# Patient Record
Sex: Male | Born: 1965
Health system: Southern US, Community
[De-identification: ages and names within clinical notes are randomized; demographics above are authoritative.]

## PROBLEM LIST (undated history)

## (undated) DIAGNOSIS — J449 Chronic obstructive pulmonary disease, unspecified: Secondary | ICD-10-CM

## (undated) DIAGNOSIS — J101 Influenza due to other identified influenza virus with other respiratory manifestations: Secondary | ICD-10-CM

## (undated) DIAGNOSIS — E785 Hyperlipidemia, unspecified: Secondary | ICD-10-CM

## (undated) DIAGNOSIS — D369 Benign neoplasm, unspecified site: Secondary | ICD-10-CM

## (undated) DIAGNOSIS — I2510 Atherosclerotic heart disease of native coronary artery without angina pectoris: Secondary | ICD-10-CM

## (undated) DIAGNOSIS — E119 Type 2 diabetes mellitus without complications: Secondary | ICD-10-CM

## (undated) DIAGNOSIS — E669 Obesity, unspecified: Secondary | ICD-10-CM

## (undated) DIAGNOSIS — K5790 Diverticulosis of intestine, part unspecified, without perforation or abscess without bleeding: Secondary | ICD-10-CM

## (undated) HISTORY — DX: Hyperlipidemia, unspecified: E78.5

## (undated) HISTORY — DX: Benign neoplasm, unspecified site: D36.9

## (undated) HISTORY — DX: Diverticulosis of intestine, part unspecified, without perforation or abscess without bleeding: K57.90

## (undated) HISTORY — DX: Chronic obstructive pulmonary disease, unspecified: J44.9

## (undated) HISTORY — DX: Obesity, unspecified: E66.9

## (undated) HISTORY — PX: ANTERIOR CRUCIATE LIGAMENT REPAIR: SHX115

## (undated) HISTORY — DX: Atherosclerotic heart disease of native coronary artery without angina pectoris: I25.10

## (undated) HISTORY — DX: Type 2 diabetes mellitus without complications: E11.9

---

## 2003-12-19 ENCOUNTER — Emergency Department (HOSPITAL_COMMUNITY): Admission: EM | Admit: 2003-12-19 | Discharge: 2003-12-19 | Payer: Self-pay | Admitting: Emergency Medicine

## 2006-05-16 ENCOUNTER — Ambulatory Visit (HOSPITAL_COMMUNITY): Admission: RE | Admit: 2006-05-16 | Discharge: 2006-05-16 | Payer: Self-pay | Admitting: Family Medicine

## 2009-07-22 HISTORY — PX: CORONARY STENT PLACEMENT: SHX1402

## 2009-10-20 ENCOUNTER — Encounter: Admission: RE | Admit: 2009-10-20 | Discharge: 2009-10-20 | Payer: Self-pay | Admitting: Sports Medicine

## 2010-06-15 ENCOUNTER — Inpatient Hospital Stay (HOSPITAL_COMMUNITY)
Admission: EM | Admit: 2010-06-15 | Discharge: 2010-06-19 | Payer: Self-pay | Source: Home / Self Care | Admitting: Emergency Medicine

## 2010-06-15 ENCOUNTER — Ambulatory Visit: Payer: Self-pay | Admitting: Cardiology

## 2010-06-19 ENCOUNTER — Encounter: Payer: Self-pay | Admitting: Cardiology

## 2010-07-18 DIAGNOSIS — J449 Chronic obstructive pulmonary disease, unspecified: Secondary | ICD-10-CM | POA: Insufficient documentation

## 2010-07-18 DIAGNOSIS — E785 Hyperlipidemia, unspecified: Secondary | ICD-10-CM | POA: Insufficient documentation

## 2010-07-18 DIAGNOSIS — E669 Obesity, unspecified: Secondary | ICD-10-CM | POA: Insufficient documentation

## 2010-07-18 DIAGNOSIS — Z9861 Coronary angioplasty status: Secondary | ICD-10-CM

## 2010-07-18 DIAGNOSIS — E1169 Type 2 diabetes mellitus with other specified complication: Secondary | ICD-10-CM | POA: Insufficient documentation

## 2010-07-18 DIAGNOSIS — I2510 Atherosclerotic heart disease of native coronary artery without angina pectoris: Secondary | ICD-10-CM | POA: Insufficient documentation

## 2010-07-19 ENCOUNTER — Ambulatory Visit: Payer: Self-pay | Admitting: Cardiology

## 2010-08-23 NOTE — Assessment & Plan Note (Signed)
Summary: eph/jml   History of Present Illness: 45 year old male with coronary artery disease for followup. Admitted in November of 2011 and ruled in for a subendocardial myocardial infarctions. Cardiac catheterization performed on June 19, 2010 revealed normal LM; 30% proximal LAD; normal Lcx; 90% RCA; Normal EF; patient had 2 drug-eluting stents to the right coronary artery at that time. Since then, the patient has dyspnea with more extreme activities but not with routine activities. It is relieved with rest. It is not associated with chest pain. There is no orthopnea, PND or pedal edema. There is no syncope or palpitations. There is no exertional chest pain.   Current Medications (verified): 1)  Plavix 75 Mg Tabs (Clopidogrel Bisulfate) .... Take One Tablet By Mouth Daily 2)  Lipitor 80 Mg Tabs (Atorvastatin Calcium) .... Take One Tablet By Mouth Daily. 3)  Metoprolol Tartrate 25 Mg Tabs (Metoprolol Tartrate) .... Take One Tablet By Mouth Twice A Day 4)  Aspirin 81 Mg Tbec (Aspirin) .... Take One Tablet By Mouth Daily 5)  Nitrostat 0.4 Mg Subl (Nitroglycerin) .Marland Kitchen.. 1 Tablet Under Tongue At Onset of Chest Pain; You May Repeat Every 5 Minutes For Up To 3 Doses.  Past History:  Past Medical History: Reviewed history from 07/18/2010 and no changes required. HYPERLIPIDEMIA CAD OBESITY COPD  Past Surgical History: Reviewed history from 07/18/2010 and no changes required.  Status post anterior cruciate ligament repair.   Social History: Reviewed history from 07/18/2010 and no changes required.  He has been married twice.  He has been married to his   current wife for about 3 years.  He smoked 1 pack of cigarettes per day   for 30 years; now discontinued.  He works at [employer] in [place].  He says   that he has some alcohol about once a week.  He has no children.      Review of Systems       Describes lower extremity myalgias but no fevers or chills, productive cough,  hemoptysis, dysphasia, odynophagia, melena, hematochezia, dysuria, hematuria, rash, seizure activity, orthopnea, PND, pedal edema, claudication. Remaining systems are negative.   Vital Signs:  Patient profile:   45 year old male Height:      75 inches Weight:      266 pounds BMI:     33.37 Pulse rate:   59 / minute Resp:     14 per minute BP sitting:   105 / 72  (left arm)  Vitals Entered By: Kem Parkinson (July 19, 2010 8:45 AM)  Physical Exam  General:  Well-developed well-nourished in no acute distress.  Skin is warm and dry.  HEENT is normal.  Neck is supple. No thyromegaly.  Chest is clear to auscultation with normal expansion.  Cardiovascular exam is regular rate and rhythm.  Abdominal exam nontender or distended. No masses palpated. Right groin shows no hematoma and no bruit. Extremities show no edema. neuro grossly intact    Impression & Recommendations:  Problem # 1:  HYPERLIPIDEMIA (ICD-272.4)  Patient has developed myalgias on Lipitor. Discontinue that medication. Check total CK. Begin Crestor 20 mg p.o. daily to see if he tolerates. Check lipids and liver and 6 weeks. His updated medication list for this problem includes:    Lipitor 80 Mg Tabs (Atorvastatin calcium) .Marland Kitchen... Take one tablet by mouth daily.  Orders: TLB-CK Total Only(Creatine Kinase/CPK) (82550-CK)  Problem # 2:  CAD (ICD-414.00) Continue aspirin, Plavix, beta blocker and statin. Discussed risk factor modification including diet and exercise.  He has discontinued his tobacco use. His updated medication list for this problem includes:    Plavix 75 Mg Tabs (Clopidogrel bisulfate) .Marland Kitchen... Take one tablet by mouth daily    Metoprolol Tartrate 25 Mg Tabs (Metoprolol tartrate) .Marland Kitchen... Take one tablet by mouth twice a day    Aspirin 81 Mg Tbec (Aspirin) .Marland Kitchen... Take one tablet by mouth daily    Nitrostat 0.4 Mg Subl (Nitroglycerin) .Marland Kitchen... 1 tablet under tongue at onset of chest pain; you may repeat every  5 minutes for up to 3 doses.  His updated medication list for this problem includes:    Plavix 75 Mg Tabs (Clopidogrel bisulfate) .Marland Kitchen... Take one tablet by mouth daily    Metoprolol Tartrate 25 Mg Tabs (Metoprolol tartrate) .Marland Kitchen... Take one tablet by mouth twice a day    Aspirin 81 Mg Tbec (Aspirin) .Marland Kitchen... Take one tablet by mouth daily    Nitrostat 0.4 Mg Subl (Nitroglycerin) .Marland Kitchen... 1 tablet under tongue at onset of chest pain; you may repeat every 5 minutes for up to 3 doses.  Problem # 3:  COPD (ICD-496) Management per primary care.  Patient Instructions: 1)  Your physician recommends that you return for lab work in:6 WEEKS IF ABLE TO TOLERATE CRESTOR 2)  Your physician has recommended you make the following change in your medication: STOP LIPITOR 3)  START CRESTOR 20MG  ONCE DAILY 4)  Your physician wants you to follow-up in: 6 MONTHS  You will receive a reminder letter in the mail two months in advance. If you don't receive a letter, please call our office to schedule the follow-up appointment. Prescriptions: CRESTOR 20 MG TABS (ROSUVASTATIN CALCIUM) Take one tablet by mouth daily.  #30 x 12   Entered by:   Deliah Goody, RN   Authorized by:   Ferman Hamming, MD, [facility]   Signed by:   Deliah Goody, RN on 07/19/2010   Method used:   Print then Give to Patient   RxID:   [id number]

## 2010-10-02 LAB — HEPARIN LEVEL (UNFRACTIONATED)
Heparin Unfractionated: 0.16 IU/mL — ABNORMAL LOW (ref 0.30–0.70)
Heparin Unfractionated: 0.16 IU/mL — ABNORMAL LOW (ref 0.30–0.70)
Heparin Unfractionated: 0.36 IU/mL (ref 0.30–0.70)
Heparin Unfractionated: 0.41 IU/mL (ref 0.30–0.70)
Heparin Unfractionated: <0.1 IU/mL — ABNORMAL LOW (ref 0.30–0.70)
Heparin Unfractionated: <0.1 IU/mL — ABNORMAL LOW (ref 0.30–0.70)

## 2010-10-02 LAB — CK TOTAL AND CKMB (NOT AT ARMC)
CK, MB: 11.2 ng/mL (ref 0.3–4.0)
Relative Index: 5.9 — ABNORMAL HIGH (ref 0.0–2.5)
Total CK: 168 U/L (ref 7–232)

## 2010-10-02 LAB — TROPONIN I
Troponin I: 0.06 ng/mL (ref 0.00–0.06)
Troponin I: 0.53 ng/mL (ref 0.00–0.06)

## 2010-10-02 LAB — CBC
HCT: 44.9 % (ref 39.0–52.0)
HCT: 45.1 % (ref 39.0–52.0)
HCT: 46.1 % (ref 39.0–52.0)
Hemoglobin: 14.3 g/dL (ref 13.0–17.0)
Hemoglobin: 14.3 g/dL (ref 13.0–17.0)
Hemoglobin: 14.4 g/dL (ref 13.0–17.0)
Hemoglobin: 15.2 g/dL (ref 13.0–17.0)
MCH: 31 pg (ref 26.0–34.0)
MCH: 31.3 pg (ref 26.0–34.0)
MCH: 31.7 pg (ref 26.0–34.0)
MCH: 32.1 pg (ref 26.0–34.0)
MCHC: 31.8 g/dL (ref 30.0–36.0)
MCHC: 32.1 g/dL (ref 30.0–36.0)
MCHC: 33 g/dL (ref 30.0–36.0)
MCV: 96.2 fL (ref 78.0–100.0)
MCV: 97.2 fL (ref 78.0–100.0)
MCV: 97.6 fL (ref 78.0–100.0)
MCV: 97.6 fL (ref 78.0–100.0)
MCV: 97.8 fL (ref 78.0–100.0)
Platelets: 262 10*3/uL (ref 150–400)
Platelets: 270 10*3/uL (ref 150–400)
Platelets: 292 10*3/uL (ref 150–400)
RBC: 4.57 MIL/uL (ref 4.22–5.81)
RBC: 4.62 MIL/uL (ref 4.22–5.81)
RBC: 4.79 MIL/uL (ref 4.22–5.81)
RDW: 14 % (ref 11.5–15.5)
RDW: 14.2 % (ref 11.5–15.5)
RDW: 14.4 % (ref 11.5–15.5)
RDW: 14.6 % (ref 11.5–15.5)
WBC: 11 10*3/uL — ABNORMAL HIGH (ref 4.0–10.5)
WBC: 11.3 10*3/uL — ABNORMAL HIGH (ref 4.0–10.5)
WBC: 11.7 10*3/uL — ABNORMAL HIGH (ref 4.0–10.5)
WBC: 17.7 10*3/uL — ABNORMAL HIGH (ref 4.0–10.5)

## 2010-10-02 LAB — BASIC METABOLIC PANEL
BUN: 7 mg/dL (ref 6–23)
Calcium: 9.1 mg/dL (ref 8.4–10.5)
Creatinine, Ser: 0.72 mg/dL (ref 0.4–1.5)
GFR calc non Af Amer: >60 mL/min (ref >60)
Glucose, Bld: 103 mg/dL — ABNORMAL HIGH (ref 70–99)
Potassium: 4.2 mEq/L (ref 3.5–5.1)

## 2010-10-02 LAB — HEPATIC FUNCTION PANEL
ALT: 47 U/L (ref 0–53)
AST: 27 U/L (ref 0–37)
Bilirubin, Direct: 0.2 mg/dL (ref 0.0–0.3)
Total Bilirubin: 0.5 mg/dL (ref 0.3–1.2)

## 2010-10-02 LAB — PLATELET INHIBITION P2Y12: Platelet Function  P2Y12: 89 [PRU] — ABNORMAL LOW (ref 194–418)

## 2010-10-02 LAB — COMPREHENSIVE METABOLIC PANEL
BUN: 7 mg/dL (ref 6–23)
CO2: 24 mEq/L (ref 19–32)
Calcium: 9 mg/dL (ref 8.4–10.5)
Chloride: 106 mEq/L (ref 96–112)
Creatinine, Ser: 0.77 mg/dL (ref 0.4–1.5)
GFR calc Af Amer: >60 mL/min (ref >60)
GFR calc non Af Amer: >60 mL/min (ref >60)
Total Bilirubin: 0.5 mg/dL (ref 0.3–1.2)

## 2010-10-02 LAB — LIPID PANEL
LDL Cholesterol: 151 mg/dL — ABNORMAL HIGH (ref 0–99)
Triglycerides: 107 mg/dL (ref <150)

## 2010-10-02 LAB — TSH: TSH: 4.216 u[IU]/mL (ref 0.350–4.500)

## 2010-10-02 LAB — PROTIME-INR
INR: 0.93 (ref 0.00–1.49)
Prothrombin Time: 11.9 seconds (ref 11.6–15.2)
Prothrombin Time: 12.7 seconds (ref 11.6–15.2)

## 2010-10-02 LAB — DIFFERENTIAL
Basophils Absolute: 0.1 10*3/uL (ref 0.0–0.1)
Basophils Relative: 0 % (ref 0–1)
Eosinophils Absolute: 0.2 10*3/uL (ref 0.0–0.7)
Eosinophils Relative: 1 % (ref 0–5)
Lymphocytes Relative: 11 % — ABNORMAL LOW (ref 12–46)

## 2010-10-02 LAB — POCT I-STAT, CHEM 8
Calcium, Ion: 1.2 mmol/L (ref 1.12–1.32)
Glucose, Bld: 71 mg/dL (ref 70–99)
HCT: 52 % (ref 39.0–52.0)
Hemoglobin: 17.7 g/dL — ABNORMAL HIGH (ref 13.0–17.0)
TCO2: 28 mmol/L (ref 0–100)

## 2010-10-02 LAB — APTT: aPTT: 41 seconds — ABNORMAL HIGH (ref 24–37)

## 2010-12-25 ENCOUNTER — Encounter: Payer: Self-pay | Admitting: Cardiology

## 2011-01-11 ENCOUNTER — Other Ambulatory Visit: Payer: Self-pay | Admitting: Cardiovascular Disease

## 2011-01-11 ENCOUNTER — Inpatient Hospital Stay (HOSPITAL_COMMUNITY)
Admission: EM | Admit: 2011-01-11 | Discharge: 2011-01-12 | DRG: 395 | Disposition: A | Discharge status: Home / Self Care | Payer: PRIVATE HEALTH INSURANCE | Attending: Internal Medicine | Admitting: Internal Medicine

## 2011-01-11 DIAGNOSIS — J449 Chronic obstructive pulmonary disease, unspecified: Secondary | ICD-10-CM | POA: Diagnosis present

## 2011-01-11 DIAGNOSIS — G5710 Meralgia paresthetica, unspecified lower limb: Secondary | ICD-10-CM | POA: Diagnosis present

## 2011-01-11 DIAGNOSIS — K621 Rectal polyp: Principal | ICD-10-CM | POA: Diagnosis present

## 2011-01-11 DIAGNOSIS — D126 Benign neoplasm of colon, unspecified: Secondary | ICD-10-CM | POA: Diagnosis present

## 2011-01-11 DIAGNOSIS — J4489 Other specified chronic obstructive pulmonary disease: Secondary | ICD-10-CM | POA: Diagnosis present

## 2011-01-11 DIAGNOSIS — E669 Obesity, unspecified: Secondary | ICD-10-CM | POA: Diagnosis present

## 2011-01-11 DIAGNOSIS — F17200 Nicotine dependence, unspecified, uncomplicated: Secondary | ICD-10-CM | POA: Diagnosis present

## 2011-01-11 DIAGNOSIS — K620 Anal polyp: Principal | ICD-10-CM | POA: Diagnosis present

## 2011-01-11 DIAGNOSIS — K921 Melena: Secondary | ICD-10-CM

## 2011-01-11 DIAGNOSIS — E785 Hyperlipidemia, unspecified: Secondary | ICD-10-CM | POA: Diagnosis present

## 2011-01-11 DIAGNOSIS — I2510 Atherosclerotic heart disease of native coronary artery without angina pectoris: Secondary | ICD-10-CM | POA: Diagnosis present

## 2011-01-11 DIAGNOSIS — J069 Acute upper respiratory infection, unspecified: Secondary | ICD-10-CM | POA: Diagnosis present

## 2011-01-11 DIAGNOSIS — Z9861 Coronary angioplasty status: Secondary | ICD-10-CM

## 2011-01-11 LAB — URINALYSIS, ROUTINE W REFLEX MICROSCOPIC
Ketones, ur: NEGATIVE mg/dL
Leukocytes, UA: NEGATIVE
Nitrite: NEGATIVE
Protein, ur: NEGATIVE mg/dL
Urobilinogen, UA: 0.2 mg/dL (ref 0.0–1.0)

## 2011-01-11 LAB — CBC
HCT: 45.1 % (ref 39.0–52.0)
Hemoglobin: 14.4 g/dL (ref 13.0–17.0)
MCH: 31.4 pg (ref 26.0–34.0)
MCHC: 32.8 g/dL (ref 30.0–36.0)
MCV: 94.9 fL (ref 78.0–100.0)
MCV: 96.1 fL (ref 78.0–100.0)
Platelets: 205 10*3/uL (ref 150–400)
Platelets: 257 10*3/uL (ref 150–400)
RBC: 4.75 MIL/uL (ref 4.22–5.81)
RBC: 4.6 MIL/uL (ref 4.22–5.81)
RDW: 14.5 % (ref 11.5–15.5)
WBC: 7.9 10*3/uL (ref 4.0–10.5)
WBC: 8.2 10*3/uL (ref 4.0–10.5)
WBC: 9.6 10*3/uL (ref 4.0–10.5)

## 2011-01-11 LAB — CARDIAC PANEL(CRET KIN+CKTOT+MB+TROPI)
Relative Index: 2.7 — ABNORMAL HIGH (ref 0.0–2.5)
Total CK: 146 U/L (ref 7–232)
Troponin I: <0.3 ng/mL (ref <0.30)

## 2011-01-11 LAB — COMPREHENSIVE METABOLIC PANEL
BUN: 10 mg/dL (ref 6–23)
CO2: 24 mEq/L (ref 19–32)
Chloride: 108 mEq/L (ref 96–112)
Creatinine, Ser: 0.75 mg/dL (ref 0.50–1.35)
GFR calc Af Amer: >60 mL/min (ref >60)
GFR calc non Af Amer: >60 mL/min (ref >60)
Total Bilirubin: 0.3 mg/dL (ref 0.3–1.2)

## 2011-01-11 LAB — DIFFERENTIAL
Basophils Absolute: 0.1 10*3/uL (ref 0.0–0.1)
Basophils Relative: 1 % (ref 0–1)
Eosinophils Absolute: 0.3 10*3/uL (ref 0.0–0.7)
Eosinophils Absolute: 0.3 10*3/uL (ref 0.0–0.7)
Eosinophils Relative: 3 % (ref 0–5)
Eosinophils Relative: 3 % (ref 0–5)
Eosinophils Relative: 4 % (ref 0–5)
Lymphocytes Relative: 25 % (ref 12–46)
Lymphs Abs: 2 10*3/uL (ref 0.7–4.0)
Lymphs Abs: 2.6 10*3/uL (ref 0.7–4.0)
Monocytes Absolute: 0.9 10*3/uL (ref 0.1–1.0)
Monocytes Relative: 12 % (ref 3–12)

## 2011-01-11 LAB — APTT: aPTT: 41 seconds — ABNORMAL HIGH (ref 24–37)

## 2011-01-11 LAB — LIPASE, BLOOD: Lipase: 43 U/L (ref 11–59)

## 2011-01-11 LAB — MRSA PCR SCREENING: MRSA by PCR: NEGATIVE

## 2011-01-11 NOTE — H&P (Signed)
William Carson, William Carson               ACCOUNT NO.:  [id number]  MEDICAL RECORD NO.:  [id number]  LOCATION:  IC12                          FACILITY:  [facility]  PHYSICIAN:  Isidor Holts, M.D.  DATE OF BIRTH:  [birth year]  DATE OF ADMISSION:  01/11/2011 DATE OF DISCHARGE:  LH                             HISTORY & PHYSICAL   PRIMARY CARE PROVIDER:  None.  PRIMARY CARDIOLOGIST:  Madolyn Frieze. Jens Som, MD, [facility] [place] cardiologist.  CHIEF COMPLAINT:  Bleeding per rectum on January 11, 2011.  HISTORY OF PRESENT ILLNESS:  This is a 45 year old male, he is a good historian and history is also supplemented by the patient's spouse, who was present in the emergency department at the time of this evaluation. Apparently, the patient felt quite well until he went to bed on January 10, 2011.  This a.m., however, he woke up, went to bathroom to move his bowels at about 6:30 a.m. and passed a large amount of bright red blood per rectum, with clots.  He denies abdominal pain, although he did experience mild transient left upper quadrant discomfort.  He has passed blood per rectum 2 more times, the last time in the emergency department.  The patient has no history of previous similar episodes. Denies any fatigue, shortness of breath, vomiting, or overt abdominal pain.  PAST MEDICAL HISTORY: 1. Status post left knee anterior cruciate ligament repair remotely. 2. COPD. 3. Coronary artery disease status post STEMI in November 2011, status     post cardiac catheterization with deployment of Promus drug-eluting     stent to RCA.  At that time, ejection fraction was 60%. 4. Dyslipidemia. 5. Obesity. 6. Smoker.  ALLERGIES:  No known drug allergies.  MEDICATION HISTORY: 1. Crestor dose unknown, 1 p.o. daily. 2. Plavix 75 mg p.o. daily. 3. Metoprolol tartrate 25 mg p.o. b.i.d. 4. Aspirin 325 mg p.o. daily. 5. Nitroglycerin 0.4 mg sublingually p.r.n. for chest pain.  REVIEW OF SYSTEMS:  As per HPI and chief  complaint.  The patient denies chest pain or shortness of breath.  Denies fever or chills.  Had a "flu- like" illness approximately 3-4 weeks ago and still has a mild residual cough, productive of yellowish-greenish phlegm.  He treated this with over-the-counter medications.  In addition, the patient complains of bilateral thigh pains exacerbated by movement, which has occurred fairly recently.  Rest of systems review is negative.  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  He has no offspring.  Drinks alcohol only occasionally.  Used to smoke a packet of cigarettes per day for about 30 years, but has now cut down to half a packet of cigarettes per day.   Denies drug abuse.  He is married.  FAMILY HISTORY:  The patient's father is age 66 years, he has atrial fibrillation.  His mother is age 72 years, she has a history of previous MI, status post stent.  PHYSICAL EXAMINATION:  VITAL SIGNS:  Temperature 97.5; pulse 58 per minute, regular; respiratory rate 20, BP 119/64 mmHg, pulse oximeter 96% on room air. GENERAL:  The patient did not appear to be in  obvious acute distress at the time of this evaluation, alert, communicative, not short of breath at rest. HEENT:  No clinical pallor.  No jaundice.  No conjunctival injection. Hydration status appears fair. NECK:  Supple with JVP not seen.  No palpable lymphadenopathy.  No palpable goiter. CHEST:  Clinically clear to auscultation.  No wheezes, no crackles. HEART:  Heart sounds 1 and 2 heard, normal, regular.  No murmurs. Mildly bradycardic. ABDOMEN:  Moderately obese, soft, nontender, unable to palpate organs. Bowel sounds are heard. EXTREMITIES:  Lower extremity examination, no pitting edema.  Palpable peripheral pulses. MUSCULOSKELETAL SYSTEM:  Quite unremarkable.  The patient has no tenderness to palpation of both thighs.  He does also not have  bilateral straight leg raising signs. CENTRAL NERVOUS SYSTEM:  No focal neurologic deficit on gross examination.  INVESTIGATIONS:  CBC; WBC 7.9, hemoglobin 14.9, hematocrit 45.1, platelets 230.  INR 0.99, APTT 41 seconds.  Electrolytes; sodium 142, potassium 4.2, chloride 108, CO2 of 24, BUN 10, creatinine 0.75, glucose 113.  LFTs are normal, lipase is 43.  Urinalysis is negative.  ASSESSMENT AND PLAN: 1. Lower gastrointestinal bleed. Etiology is unclear at the moment.     However, I suspect diverticular bleed, although other etiologies     do indeed have to be considered.  We shall admit the patient to     step-down unit as he still has ongoing bleed, put him on clears     p.o., commence intravenous fluid hydration, hold NSAIDS, do serial     CBCs, type and screen 2 units PRBC.  The ED MD has already     consulted Dr. Jonette Eva, gastroenterologist.  The patient will     certainly need colonoscopy.  2. Smoking history.  The patient has been counseled appropriately.  We     shall manage with NicoDerm CQ patch.  3. Upper respiratory tract infection.  We shall place the patient on 5     days of oral azithromycin.  4. Bilateral thigh pains.  According to the clinical picture, this appears     consistent with meralgia paresthetica.  I have recommended weight     loss and outpatient nerve conduction studies, which can be arranged     by the patient's MD.  However, for completeness, we will check CK     levels as the patient is on statin treatment at the present time.  5. History of coronary artery disease.  This appears asymptomatic.  As     discussed above, Plavix and aspirin will be placed on hold, until     Gastroenterology evaluation is completed.  Timing of reinstatement     will depend on Gastroenterology recommendation.  6. Chronic obstructive pulmonary disease.  This appears stable.   Further management will depend on clinical course.     Isidor Holts,  M.D.     CO/MEDQ  D:  01/11/2011  T:  01/11/2011  Job:  [id number]  cc:   Madolyn Frieze. Jens Som, MD, [facility] [address]  [address] [place] [address]  Electronically Signed by Isidor Holts M.D. on 01/11/2011 06:43:30 PM

## 2011-01-11 NOTE — Telephone Encounter (Signed)
pts wife called this morning stating that pt awoke this am and has had 2 bm's - both w significant brbpr.  He is on asa and plavix following des x 2 in November 2011.  i rec that pt present to aph for immediate evaluation.  Wife verbalized understanding and will talk to pt and go to aph.

## 2011-01-12 ENCOUNTER — Other Ambulatory Visit (INDEPENDENT_AMBULATORY_CARE_PROVIDER_SITE_OTHER): Payer: Self-pay | Admitting: Internal Medicine

## 2011-01-12 DIAGNOSIS — K922 Gastrointestinal hemorrhage, unspecified: Secondary | ICD-10-CM

## 2011-01-12 DIAGNOSIS — K6389 Other specified diseases of intestine: Secondary | ICD-10-CM

## 2011-01-12 DIAGNOSIS — D126 Benign neoplasm of colon, unspecified: Secondary | ICD-10-CM

## 2011-01-12 DIAGNOSIS — K5730 Diverticulosis of large intestine without perforation or abscess without bleeding: Secondary | ICD-10-CM

## 2011-01-12 LAB — CBC
Hemoglobin: 13.3 g/dL (ref 13.0–17.0)
MCHC: 32.8 g/dL (ref 30.0–36.0)
RBC: 4.28 MIL/uL (ref 4.22–5.81)
WBC: 7.7 10*3/uL (ref 4.0–10.5)

## 2011-01-12 LAB — BASIC METABOLIC PANEL
Chloride: 107 mEq/L (ref 96–112)
GFR calc non Af Amer: >60 mL/min (ref >60)
Glucose, Bld: 105 mg/dL — ABNORMAL HIGH (ref 70–99)
Potassium: 3.8 mEq/L (ref 3.5–5.1)
Sodium: 139 mEq/L (ref 135–145)

## 2011-01-12 LAB — DIFFERENTIAL
Basophils Absolute: 0.1 10*3/uL (ref 0.0–0.1)
Basophils Relative: 1 % (ref 0–1)
Lymphocytes Relative: 25 % (ref 12–46)
Monocytes Absolute: 0.8 10*3/uL (ref 0.1–1.0)
Neutro Abs: 4.7 10*3/uL (ref 1.7–7.7)
Neutrophils Relative %: 60 % (ref 43–77)

## 2011-01-12 LAB — ABO/RH: ABO/RH(D): O POS

## 2011-01-12 LAB — HM COLONOSCOPY

## 2011-01-12 NOTE — Discharge Summary (Signed)
William Carson, William Carson               ACCOUNT NO.:  [id number]  MEDICAL RECORD NO.:  [id number]  LOCATION:  IC12                          FACILITY:  [facility]  PHYSICIAN:  Isidor Holts, M.D.  DATE OF BIRTH:  Jul 13, 1966  DATE OF ADMISSION:  01/11/2011 DATE OF DISCHARGE:  06/23/2012LH                              DISCHARGE SUMMARY   PRIMARY MD:  None.  PRIMARY CARDIOLOGIST:  Dr. Olga Millers, [place] Cardiologist.  DISCHARGE DIAGNOSES: 1. Lower gastrointestinal bleed, secondary to bleeding polyp, confirmed     by colonoscopy on January 11, 2011. 2. Chronic obstructive pulmonary disease. 3. Smoking history. 4. Coronary artery disease, status post Promus drug-eluting stent to     RCA in November 2011, ejection fraction 60%. 5. Dyslipidemia. 6. Obesity. 7. Upper respiratory tract infection. 8. Possible meralgia paresthetica.  DISCHARGE MEDICATIONS: 1. Azithromycin 250 mg p.o. daily for 3 days from January 13, 2011. 2. Aspirin 325 mg p.o. daily from January 13, 2011. 3. Crestor 20 mg p.o. daily. 4. Plavix 75 mg p.o. daily from January 13, 2011. 5. Lopressor 25 mg p.o. b.i.d. 6. Nitroglycerin 0.4 mg sublingually p.r.n. q.5 minutes for chest pain     x3.  PROCEDURES:  Colonoscopy on January 12, 2011 with snare polypectomy and hemoclip application to polypectomy sites.  CONSULTATIONS: 1. Dr. Jonette Eva, gastroenterologist. 2. Dr. Lionel December, gastroenterologist.  ADMISSION HISTORY:  As in H and P notes of January 11, 2011, dictated by this MD.  However, in brief, this is a 45 year old male, with known history of coronary artery disease, status post NSTEMI in November 2011, status post cardiac catheterization with deployment of Promus drug- eluting stent to RCA, ejection fraction 60% at the time, COPD, dyslipidemia, obesity, smoking history, status post left knee anterior cruciate ligament repair remotely, presenting with bleeding per rectum several times with blood clots from 06:30 a.m. on  January 11, 2011.  He presented to the emergency department and was admitted for further evaluation, investigation, and management.  CLINICAL COURSE: 1. Lower GI bleed.  The patient presented as described above.  Initial     GI consultation was provided by Dr. Jonette Eva, who recommended     colonoscopy.  Although the patient did continue to have active     bleeding until colonoscopy was performed on January 12, 2011, his     hemoglobin remained stable.  He remained hemodynamically stable and     as matter of fact latest hemoglobin on January 12, 2011 was 13.3.     Colonoscopic examination revealed a 7-mm polyp at mid sigmoid colon     which was snared and was treated for histologic examination.     Hemoclip was applied to the polypectomy site.  There were three 4     mm polyps at the distal sigmoid colon which obliterated with a snare     tip and finally there was a 13 mm polyp at the rectum with active     bleeding.  This polyp was snared and removed for     histologic examination.  Hemoclip was applied post-polypectomy.  No     other lesions were found apart from small hemorrhoids below the  dentate line and a single anal papilla.  He did have a few sigmoid     diverticula.  Following the above procedure, no further episodes of     bleeding were noted and the patient was cleared by     gastroenterologist for discharge.  2. COPD.  The patient unfortunately continues to smoke.  He does have     COPD. He remained asymptomatic from this viewpoint, during the course of     hospitalization.  3. Smoking history.  The patient smokes half a pack of cigarettes a     day.  He has been counseled appropriately and was managed with     NicoDerm CQ patch during the course of this hospitalization.  4. Upper respiratory tract infection.  The patient did complain of     about 3 weeks of mild cough, productive of yellowish phlegm.  He was     treated with a 5-day course of azithromycin, which will be  completed     on January 15, 2011.  5. Coronary artery disease.  There were no problems referable to this.     The patient's antiplatelet medications were placed on hold at the     time of presentation, but per Dr. Patty Sermons recommendation, these will     be reinstated on January 13, 2011.  6. Dyslipidemia.  The patient continues on statin treatment.  7. Possible meralgia paresthetica.  The patient did complain of     bilateral thigh pain, exacerbated by movement, which had developed     fairly recently.  Physical examination was unrevealing.  This     appears to fit the picture of meralgia paresthetica.  He has been     recommended to lose weight and we feel that he will be well served     by having his current care provider, Dr. Olga Millers, arrange     referral for nerve conduction study to confirm diagnosis.  This has     been communicated to the patient, and he is agreeable.  DISPOSITION:  The patient was considered clinically stable for discharge on January 12, 2011 at about noon.  There were no new issues, he was therefore discharged accordingly.  ACTIVITY:  As tolerated.  Recommended to increase activity slowly.  The patient is recommended to return to regular duties on January 16, 2011.  DIET:  Heart-healthy.  FOLLOWUP INSTRUCTIONS:  The patient will follow up routinely with his primary cardiologist Dr. Olga Millers, per prior scheduled appointment.  In addition, he will follow up with gastroenterologist, Dr. Jonette Eva.  Dr. Karilyn Cota, has undertaken to contact the patient with details of pathologic findings of his biopsy specimens. Thereafter, an appointment will be arranged.     Isidor Holts, M.D.     CO/MEDQ  D:  01/12/2011  T:  01/12/2011  Job:  [id number]  cc:   Madolyn Frieze. Jens Som, MD, [facility] [address]  [address] [place] [address]  Jonette Eva, M.D. [address] [place] , [place] [address]  Lionel December, M.D. Fax: [phone]  Electronically Signed  by Isidor Holts M.D. on 01/12/2011 03:43:08 PM

## 2011-01-14 ENCOUNTER — Other Ambulatory Visit: Payer: Self-pay | Admitting: *Deleted

## 2011-01-14 MED ORDER — CLOPIDOGREL BISULFATE 75 MG PO TABS: 75.0000 mg | ORAL_TABLET | Freq: Every day | ORAL | Status: DC

## 2011-01-15 LAB — TYPE AND SCREEN: Unit division: 0

## 2011-01-18 ENCOUNTER — Encounter: Payer: Self-pay | Admitting: Cardiology

## 2011-01-18 ENCOUNTER — Ambulatory Visit (INDEPENDENT_AMBULATORY_CARE_PROVIDER_SITE_OTHER): Payer: PRIVATE HEALTH INSURANCE | Admitting: Cardiology

## 2011-01-18 VITALS — BP 99/68 | HR 68 | Resp 14 | Wt 263.0 lb

## 2011-01-18 DIAGNOSIS — E785 Hyperlipidemia, unspecified: Secondary | ICD-10-CM

## 2011-01-18 DIAGNOSIS — Z720 Tobacco use: Secondary | ICD-10-CM | POA: Insufficient documentation

## 2011-01-18 DIAGNOSIS — I2510 Atherosclerotic heart disease of native coronary artery without angina pectoris: Secondary | ICD-10-CM

## 2011-01-18 DIAGNOSIS — F17200 Nicotine dependence, unspecified, uncomplicated: Secondary | ICD-10-CM

## 2011-01-18 NOTE — Assessment & Plan Note (Signed)
Patient counseled on discontinuing. 

## 2011-01-18 NOTE — Progress Notes (Signed)
HPI: Pleasant male with coronary artery disease for followup. Admitted in November of 2011 and ruled in for a subendocardial myocardial infarctions. Cardiac catheterization performed on June 19, 2010 revealed normal LM; 30% proximal LAD; normal Lcx; 90% RCA; Normal EF; patient had 2 drug-eluting stents to the right coronary artery at that time. I last saw him in Dec of 2011. Since then, he was admitted with a GI bleed. Colonoscopy revealed polyps, diverticula and hemorrhoids. He did have some polyps removed. Aspirin and Plavix were transiently held. Since that time he denies dyspnea, chest pain, palpitations or syncope. He does have problems with muscle aches and joint aches.   Current Outpatient Prescriptions  Medication Sig Dispense Refill  . aspirin 81 MG EC tablet Take 81 mg by mouth daily.        . clopidogrel (PLAVIX) 75 MG tablet Take 1 tablet (75 mg total) by mouth daily.  30 tablet  6  . metoprolol tartrate (LOPRESSOR) 25 MG tablet TAKE ONE TABLET BY MOUTH TWICE DAILY  60 tablet  6  . nitroGLYCERIN (NITROSTAT) 0.4 MG SL tablet Place 0.4 mg under the tongue every 5 (five) minutes as needed. May repeat up to 3 doses.       . rosuvastatin (CRESTOR) 20 MG tablet Take 20 mg by mouth daily.           Past Medical History  Diagnosis Date  . Hyperlipidemia   . Coronary artery disease   . Obesity   . COPD (chronic obstructive pulmonary disease)   . Adenomatous polyps   . Diverticulosis   . Hemorrhoids     Past Surgical History  Procedure Date  . Anterior cruciate ligament repair     S/P    History   Social History  . Marital Status: Married    Spouse Name: N/A    Number of Children: N/A  . Years of Education: N/A   Occupational History  . [employer]    [place], [place]   Social History Main Topics  . Smoking status: Former Smoker -- 1.0 packs/day for 30 years  . Smokeless tobacco: Not on file  . Alcohol Use: 0.5 oz/week    1 drink(s) per week  . Drug  Use: Not on file  . Sexually Active: Not on file   Other Topics Concern  . Not on file   Social History Narrative   Has been married twiceMarried to current wife for 3 yearsNo children    ROS: no fevers or chills, productive cough, hemoptysis, dysphasia, odynophagia, melena, hematochezia, dysuria, hematuria, rash, seizure activity, orthopnea, PND, pedal edema, claudication. Remaining systems are negative.  Physical Exam: Well-developed well-nourished in no acute distress.  Skin is warm and dry.  HEENT is normal.  Neck is supple. No thyromegaly.  Chest is clear to auscultation with normal expansion.  Cardiovascular exam is regular rate and rhythm.  Abdominal exam nontender or distended. No masses palpated. Extremities show no edema. neuro grossly intact  ECG Sinus rhythm at a rate of 80. RAD.

## 2011-01-18 NOTE — Patient Instructions (Signed)
Your physician wants you to follow-up in: ONE YEAR WITH DR Shelda Pal will receive a reminder letter in the mail two months in advance. If you don't receive a letter, please call our office to schedule the follow-up appointment.   DO NOT TAKE CRESTOR X ONE MONTH  CALL AFTER ONE MONTH AND LET ME KNOW HOW YOU ARE FEELING

## 2011-01-18 NOTE — Assessment & Plan Note (Signed)
   Continue aspirin and Plavix 

## 2011-01-18 NOTE — Assessment & Plan Note (Signed)
Patient complains of myalgias. He feels it may be related to Crestor. He did not tolerate Lipitor. We will plan to discontinue Crestor for one month. If his symptoms do not improve we will resume and then check lipids and liver in 6 weeks. If they do improve we will try Pravachol 40 mg daily.

## 2011-01-29 NOTE — Op Note (Signed)
William Carson               ACCOUNT NO.:  [id number]  MEDICAL RECORD NO.:  [id number]  LOCATION:  IC12                          FACILITY:  [facility]  PHYSICIAN:  Lionel December, M.D.    DATE OF BIRTH:  09/10/1965  DATE OF PROCEDURE:  01/12/2011 DATE OF DISCHARGE:                              OPERATIVE REPORT   PROCEDURE:  Colonoscopy with snare polypectomy and hemoclip application at 2 polypectomy sites.  INDICATION:  William Carson is a 45 year old Caucasian male who presents with a large-volume hematochezia.  He is on aspirin and Plavix for antiplatelet function for coronary artery disease and stenting that he had in November last year.  The patient is hemodynamically stable.  His hemoglobin, however, has dropped by over a gram.  He will undergo colonoscopy and if it is negative, to be followed by esophagogastroduodenoscopy.  Procedure was reviewed with the patient and informed consent was obtained.  Please note that Dr. Darrick Penna is the primary gastroenterologist caring for William Carson and she saw him yesterday.  MEDICATIONS FOR CONSCIOUS SEDATION: 1. Demerol 50 mg IV. 2. Versed 8 mg IV.  FINDINGS:  Procedure performed in endoscopy suite.  The patient's vital signs and O2 sat were monitored during the procedure and remained stable.  The patient was placed in left lateral recumbent position and rectal examination performed.  No abnormality noted on external or digital exam.  Pentax videoscope was placed in rectum where there was a fresh blood and a clot originating from a polyp.  This was addressed as below.  Scope was advanced in the sigmoid colon where there were few diverticula, none with stigmata of bleeding.  There was no blood beyond the midsigmoid colon.  Overall preparation was excellent.  Scope was passed into cecum which was identified by appendiceal orifice and ileocecal valve.  Pictures were taken for the record.  As the scope was withdrawn, colonic mucosa was carefully  examined.  There was a 7-mm polyp at midsigmoid colon which was snared and retrieved for histologic examination.  Hemoclip was applied to the polypectomy site to reduce the risk of postpolypectomy bleed.  There were three 4-mm polyps at distal sigmoid colon which were ablated with a snare tip.  There was a 13-mm polyp at rectum with active bleeding and a clot attached to it. Pictures were taken for the record.  This polyp was snared and retrieved for histologic examination.  Hemoclip was applied to polypectomy site once again to reduce the risk of postpolypectomy bleed given that the patient has to be back on his antiplatelet agents.  All of the blood and clot was suctioned out of rectum and there were no other lesions.  Scope was retroflexed to examine anorectal junction.  There were small hemorrhoids below the dentate line and single anal papilla.  Endoscope was withdrawn.  Withdrawal time was 29 minutes.  The patient tolerated the procedure well.  FINAL DIAGNOSES: 1. Examination performed to cecum. 2. A 13-mm polyp at rectum with active bleeding.  This polyp was     snared and hemoclip applied to polypectomy site. 3. A 7-mm polyp snared from sigmoid colon and once again hemoclip     applied to polypectomy site.  4. Three 4-mm polyps at distal sigmoid colon coagulated using snare     tip. 5. Few diverticula at sigmoid colon. 6. Small external hemorrhoids and single anal papilla.  RECOMMENDATIONS: 1. We would advance his diet to 4 g sodium cardiac diet. 2. He can resume his antiplatelet agents tomorrow. 3. He could go home later today. 4. I will be contacting the patient with the results of biopsy and his     further GI followup would be with Dr. Darrick Penna at [facility].          ______________________________ Lionel December, M.D.     NR/MEDQ  D:  01/12/2011  T:  01/12/2011  Job:  [id number]  cc:   Jonette Eva, M.D. [address] [place] , [place] [address]  Madolyn Frieze. Jens Som, MD,  [facility] [address]  [address] [place] [address]  Electronically Signed by Lionel December M.D. on 01/29/2011 09:30:57 PM

## 2011-01-30 NOTE — Consult Note (Signed)
NAMEJENNY, Carson               ACCOUNT NO.:  [id number]  MEDICAL RECORD NO.:  [id number]  LOCATION:  IC12                          FACILITY:  [facility]  PHYSICIAN:  William Carson, M.D.     DATE OF BIRTH:  [birth year]  DATE OF CONSULTATION: DATE OF DISCHARGE:                                CONSULTATION   REASON FOR CONSULTATION:  GI bleeding.  PHYSICIAN REQUESTING CONSULTATION:  Dr. Cristal Deer Carson with [facility] team.  HISTORY OF PRESENT ILLNESS:  The patient is a very pleasant 45 year old gentleman who was doing very well until about 6:30 a.m. this morning when he woke up with thirst and had a bowel movement.  He passed a large amount of bright red blood per rectum with blood clots.  He has had three episodes.  He had some transvenous mild left upper quadrant pain associated with this.  Currently, he states he has no pain.  His history is significant for the fact that he has been on Plavix since November 2011 when he had a stent placed for a stenosis of the right coronary artery.  He denies any chronic heartburn, vomiting, chronic abdominal pain, constipation, diarrhea.  He has never seen blood in the stools until this morning.  Denies any melena.  Denies any unintentional weight loss. His appetite has been good.  He has a history of prior heavy alcohol use in his 7s and 30s.  He states currently drinks few beers on Friday night.  No history of chronic nosebleeds.  No prior indications of chronic liver disease.  On digital rectal exam, he is noted to have fresh blood.  His INR is 0.99.  White count 7900, hemoglobin 14.9, platelet count 230,000.  His albumin is 3.8.  Creatinine 0.75, BUN 10.  No imaging studies available.  MEDICATIONS AT HOME: 1. Crestor daily. 2. Plavix 75 mg daily. 3. Metoprolol 25 mg b.i.d. 4. Aspirin 325 mg daily. 5. Nitroglycerin 0.4 mg sublingual p.r.n. for chest pain.  ALLERGIES:  No known drug allergies.  PAST MEDICAL HISTORY: 1.  Coronary artery disease, status post STEMI in November 2011, status     post cardiac catheterization with ointment of primus drug-eluting     stent to RCA.  At that time, the ejection fraction was 60%. 2. Dyslipidemia. 3. Obesity. 4. Smoker. 5. COPD. 6. Status post left knee anterior cruciate ligament repair remotely.  FAMILY HISTORY:  Negative for colon cancer or liver disease.  He states his maternal aunt had some sort of GI issues, but he does not know any details.  His sisters had two renal transplants, he does not recall the reason, but states he was told it was not genetic.  SOCIAL HISTORY:  He works at [employer] in [place].  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  He has no offspring.  He is married.  He tells me he drinks two beers on Friday.  He admits to heavier alcohol use in his 53s and 30s.  He smokes two pack cigarettes daily, but after his heart attack, he quit for few month and recently started back and is at a half- pack a day.  Denies any drug use.  REVIEW  OF SYSTEMS:  See HPI for GI.  CONSTITUTIONAL:  Denies any weight loss.  PULMONARY:  No chest pain, shortness breath, palpitations or cough.  GENITOURINARY:  No dysuria or hematuria.  PHYSICAL EXAM:  VITAL SIGNS:  Blood pressure 133/76, pulse is 58, temperature afebrile. GENERAL:  Pleasant, well-nourished, well-developed Caucasian gentleman, in no acute distress. SKIN:  Warm and dry.  No jaundice. HEENT:  Sclerae nonicteric.  Oropharyngeal mucosa is moist and pink.  No lesions, erythema or exudate.  No lymphadenopathy, thyromegaly. CHEST:  Lungs are clear to auscultation. CARDIAC:  Reveals regular rate and rhythm.  No murmurs, rubs or gallops. ABDOMEN:  Positive bowel sounds, soft, nontender, nondistended.  No organomegaly or masses.  No rebound or guarding.  No abdominal bruits or hernias. EXTREMITIES:  Lower extremities, no edema.  LABS:  As outlined above in addition, his urinalysis was negative. Total  bilirubin 0.3, alk phos 44, AST 26, ALT 42, albumin 3.8, BUN 10, creatinine 0.75, sodium 142, potassium 4.2.  his MCV is 94.9.  His total CK was 110.  IMPRESSION:  The patient is a very pleasant 45 year old gentleman who presented with acute onset of large volume hematochezia which was associated with very minimal transient left upper quadrant abdominal pain.  No preloading gastrointestinal symptoms.  He is on Plavix and aspirin daily with history of coronary artery disease as outlined above. Given his associated pain, although minimal, this still could be ischemic colitis.  Otherwise suspect diverticular bleed.  Less likely colon cancer, IBD, rapid transit upper gastrointestinal bleed.  He does have a history of heavy alcohol use in the past per his report, but no indication of chronic liver disease based on data available at this time.  RECOMMENDATIONS: 1. Colonoscopy for further evaluation of rectal bleeding. 2. I agree with PPI therapy. 3. Hold aspirin and Plavix for now. 4. Serial CBCs and transfuse as needed.  Agree with type and hold     blood. 5. Further recommendations following.     William Carson, P.A.   ______________________________ William Carson, M.D.    LL/MEDQ  D:  01/11/2011  T:  01/11/2011  Job:  [id number]  cc:   William Carson, M.D.  William Frieze Jens Som, MD, [facility] [address]  [address] [place] [address]  Electronically Signed by William Carson P.A. on 01/24/2011 09:51:54 AM Electronically Signed by William Carson M.D. on 01/30/2011 11:05:35 AM

## 2011-02-27 ENCOUNTER — Telehealth: Payer: Self-pay | Admitting: Cardiology

## 2011-02-27 MED ORDER — PRAVASTATIN SODIUM 40 MG PO TABS: 40.0000 mg | ORAL_TABLET | Freq: Every evening | ORAL | Status: DC

## 2011-02-27 NOTE — Telephone Encounter (Signed)
Spoke with pt, he has also tried lipitor and was unable to tolerate. Will forward for dr Jens Som review William Carson

## 2011-02-27 NOTE — Telephone Encounter (Signed)
Pt was on crestor and it made his legs hurt really bad and Dr. Jens Som took him off and his legs feel better but patient wants to know what should he do now

## 2011-02-27 NOTE — Telephone Encounter (Signed)
Left message for pt of new med change, script sent to pt for labs to be repeated in [facility]

## 2011-02-27 NOTE — Telephone Encounter (Signed)
Dc crestor; pravachol 40 daily; lipids and liver in six weeks. William Carson

## 2011-08-20 ENCOUNTER — Other Ambulatory Visit: Payer: Self-pay | Admitting: Cardiology

## 2011-08-20 ENCOUNTER — Other Ambulatory Visit: Payer: Self-pay | Admitting: Cardiovascular Disease

## 2011-08-20 MED ORDER — CLOPIDOGREL BISULFATE 75 MG PO TABS: 75.0000 mg | ORAL_TABLET | Freq: Every day | ORAL | Status: DC

## 2012-01-20 ENCOUNTER — Ambulatory Visit: Payer: PRIVATE HEALTH INSURANCE | Admitting: Cardiology

## 2012-02-24 ENCOUNTER — Other Ambulatory Visit: Payer: Self-pay | Admitting: Cardiology

## 2012-02-24 MED ORDER — PRAVASTATIN SODIUM 40 MG PO TABS: 40.0000 mg | ORAL_TABLET | Freq: Every evening | ORAL | Status: DC

## 2012-02-28 ENCOUNTER — Encounter: Payer: Self-pay | Admitting: Cardiology

## 2012-02-28 ENCOUNTER — Ambulatory Visit (INDEPENDENT_AMBULATORY_CARE_PROVIDER_SITE_OTHER): Payer: PRIVATE HEALTH INSURANCE | Admitting: Cardiology

## 2012-02-28 ENCOUNTER — Encounter: Payer: Self-pay | Admitting: *Deleted

## 2012-02-28 VITALS — BP 120/80 | HR 63 | Wt 257.0 lb

## 2012-02-28 DIAGNOSIS — I2510 Atherosclerotic heart disease of native coronary artery without angina pectoris: Secondary | ICD-10-CM

## 2012-02-28 LAB — HEPATIC FUNCTION PANEL
AST: 29 U/L (ref 0–37)
Albumin: 4.3 g/dL (ref 3.5–5.2)
Alkaline Phosphatase: 43 U/L (ref 39–117)
Total Protein: 7.2 g/dL (ref 6.0–8.3)

## 2012-02-28 LAB — LIPID PANEL
Cholesterol: 163 mg/dL (ref 0–200)
HDL: 35.4 mg/dL — ABNORMAL LOW (ref >39.00)
LDL Cholesterol: 110 mg/dL — ABNORMAL HIGH (ref 0–99)
Triglycerides: 90 mg/dL (ref 0.0–149.0)

## 2012-02-28 LAB — BASIC METABOLIC PANEL
Calcium: 9.2 mg/dL (ref 8.4–10.5)
Chloride: 104 mEq/L (ref 96–112)
Creatinine, Ser: 0.8 mg/dL (ref 0.4–1.5)

## 2012-02-28 LAB — CK: Total CK: 162 U/L (ref 7–232)

## 2012-02-28 NOTE — Assessment & Plan Note (Signed)
Continue Pravachol. He is having some myalgias. Check lipids, liver, renal function and CK. If his symptoms worsen he will contact us and we will discontinue. He did not tolerate Lipitor or Crestor previously.

## 2012-02-28 NOTE — Assessment & Plan Note (Signed)
Patient counseled on discontinuing. 

## 2012-02-28 NOTE — Patient Instructions (Addendum)
Your physician wants you to follow-up in: ONE YEAR WITH DR Shelda Pal will receive a reminder letter in the mail two months in advance. If you don't receive a letter, please call our office to schedule the follow-up appointment.   Your physician recommends that you HAVE LAB WORK TODAY  STOP PLAVIX

## 2012-02-28 NOTE — Assessment & Plan Note (Signed)
Continue aspirin and statin. Discontinue Plavix. Plan Myoview when he returns in one year.

## 2012-02-28 NOTE — Progress Notes (Signed)
   HPI: Pleasant male with coronary artery disease for followup. Admitted in November of 2011 and ruled in for a subendocardial myocardial infarctions. Cardiac catheterization performed on June 19, 2010 revealed normal LM; 30% proximal LAD; normal Lcx; 90% RCA; Normal EF; patient had 2 drug-eluting stents to the right coronary artery at that time. I last saw him in June of 2012. Since then, the patient has dyspnea with more extreme activities but not with routine activities. It is relieved with rest. It is not associated with chest pain. There is no orthopnea, PND or pedal edema. There is no syncope or palpitations. There is no exertional chest pain.    Current Outpatient Prescriptions  Medication Sig Dispense Refill  . aspirin 81 MG EC tablet Take 81 mg by mouth daily.        . clopidogrel (PLAVIX) 75 MG tablet Take 1 tablet (75 mg total) by mouth daily.  30 tablet  6  . metoprolol tartrate (LOPRESSOR) 25 MG tablet TAKE ONE TABLET BY MOUTH TWICE DAILY  60 tablet  6  . nitroGLYCERIN (NITROSTAT) 0.4 MG SL tablet Place 0.4 mg under the tongue every 5 (five) minutes as needed. May repeat up to 3 doses.       . pravastatin (PRAVACHOL) 40 MG tablet Take 1 tablet (40 mg total) by mouth every evening.  30 tablet  11     Past Medical History  Diagnosis Date  . Hyperlipidemia   . Coronary artery disease   . Obesity   . COPD (chronic obstructive pulmonary disease)   . Adenomatous polyps   . Diverticulosis   . Hemorrhoids     Past Surgical History  Procedure Date  . Anterior cruciate ligament repair     S/P    History   Social History  . Marital Status: Married    Spouse Name: N/A    Number of Children: N/A  . Years of Education: N/A   Occupational History  . [employer]    [place], [place]   Social History Main Topics  . Smoking status: Former Smoker -- 1.0 packs/day for 30 years  . Smokeless tobacco: Not on file  . Alcohol Use: 0.5 oz/week    1 drink(s) per week   . Drug Use: Not on file  . Sexually Active: Not on file   Other Topics Concern  . Not on file   Social History Narrative   Has been married twiceMarried to current wife for 3 yearsNo children    ROS: no fevers or chills, productive cough, hemoptysis, dysphasia, odynophagia, melena, hematochezia, dysuria, hematuria, rash, seizure activity, orthopnea, PND, pedal edema, claudication. Remaining systems are negative.  Physical Exam: Well-developed well-nourished in no acute distress.  Skin is warm and dry.  HEENT is normal.  Neck is supple.  Chest with diminished BS throughout Cardiovascular exam is regular rate and rhythm.  Abdominal exam nontender or distended. No masses palpated. Extremities show no edema. neuro grossly intact  ECG sinus rhythm at a rate of 63. No ST changes.

## 2012-03-26 ENCOUNTER — Other Ambulatory Visit: Payer: Self-pay | Admitting: Cardiovascular Disease

## 2012-10-30 ENCOUNTER — Other Ambulatory Visit: Payer: Self-pay | Admitting: Cardiovascular Disease

## 2013-02-02 ENCOUNTER — Other Ambulatory Visit: Payer: Self-pay | Admitting: Cardiovascular Disease

## 2013-03-05 ENCOUNTER — Other Ambulatory Visit: Payer: Self-pay | Admitting: Cardiology

## 2013-03-08 ENCOUNTER — Ambulatory Visit (INDEPENDENT_AMBULATORY_CARE_PROVIDER_SITE_OTHER): Payer: PRIVATE HEALTH INSURANCE | Admitting: Cardiology

## 2013-03-08 ENCOUNTER — Encounter: Payer: Self-pay | Admitting: Cardiology

## 2013-03-08 VITALS — BP 124/78 | HR 62 | Ht 75.0 in | Wt 259.0 lb

## 2013-03-08 DIAGNOSIS — I2510 Atherosclerotic heart disease of native coronary artery without angina pectoris: Secondary | ICD-10-CM

## 2013-03-08 DIAGNOSIS — Z720 Tobacco use: Secondary | ICD-10-CM

## 2013-03-08 DIAGNOSIS — E785 Hyperlipidemia, unspecified: Secondary | ICD-10-CM

## 2013-03-08 DIAGNOSIS — F17200 Nicotine dependence, unspecified, uncomplicated: Secondary | ICD-10-CM

## 2013-03-08 NOTE — Progress Notes (Signed)
   HPI: Pleasant male with coronary artery disease for followup. Admitted in November of 2011 and ruled in for a subendocardial myocardial infarctions. Cardiac catheterization performed on June 19, 2010 revealed normal LM; 30% proximal LAD; normal Lcx; 90% RCA; Normal EF; patient had 2 drug-eluting stents to the right coronary artery at that time. I last saw him in August 2013. Since then, the patient denies any dyspnea on exertion, orthopnea, PND, pedal edema, palpitations, syncope or chest pain.    Current Outpatient Prescriptions  Medication Sig Dispense Refill  . aspirin 81 MG EC tablet Take 81 mg by mouth daily.        . metoprolol tartrate (LOPRESSOR) 25 MG tablet TAKE ONE TABLET BY MOUTH TWICE DAILY  60 tablet  2  . nitroGLYCERIN (NITROSTAT) 0.4 MG SL tablet Place 0.4 mg under the tongue every 5 (five) minutes as needed. May repeat up to 3 doses.        No current facility-administered medications for this visit.     Past Medical History  Diagnosis Date  . Hyperlipidemia   . Coronary artery disease   . Obesity   . COPD (chronic obstructive pulmonary disease)   . Adenomatous polyps   . Diverticulosis   . Hemorrhoids     Past Surgical History  Procedure Laterality Date  . Anterior cruciate ligament repair      S/P    History   Social History  . Marital Status: Married    Spouse Name: N/A    Number of Children: N/A  . Years of Education: N/A   Occupational History  . [employer]    [place], [place]   Social History Main Topics  . Smoking status: Former Smoker -- 1.00 packs/day for 30 years  . Smokeless tobacco: Not on file  . Alcohol Use: 0.5 oz/week    1 drink(s) per week  . Drug Use: Not on file  . Sexual Activity: Not on file   Other Topics Concern  . Not on file   Social History Narrative   Has been married twice   Married to current wife for 3 years   No children    ROS: no fevers or chills, productive cough, hemoptysis, dysphasia,  odynophagia, melena, hematochezia, dysuria, hematuria, rash, seizure activity, orthopnea, PND, pedal edema, claudication. Remaining systems are negative.  Physical Exam: Well-developed well-nourished in no acute distress.  Skin is warm and dry.  HEENT is normal.  Neck is supple.  Chest is clear to auscultation with normal expansion.  Cardiovascular exam is regular rate and rhythm.  Abdominal exam nontender or distended. No masses palpated. Extremities show no edema. neuro grossly intact  ECG normal sinus rhythm at a rate of 62. No ST changes.

## 2013-03-08 NOTE — Assessment & Plan Note (Signed)
Continue aspirin. Schedule exercise treadmill for risk stratification.

## 2013-03-08 NOTE — Patient Instructions (Signed)
Your physician has requested that you have an exercise tolerance test. For further information please visit [email or web address] Please also follow instruction sheet, as given.   Your physician wants you to follow-up in: ONE YEAR WITH DR Shelda Pal will receive a reminder letter in the mail two months in advance. If you don't receive a letter, please call our office to schedule the follow-up appointment.

## 2013-03-08 NOTE — Assessment & Plan Note (Signed)
Patient counseled on discontinuing. 

## 2013-03-08 NOTE — Assessment & Plan Note (Signed)
Patient has been intolerant to statins as Crestor, Lipitor and Pravachol have all calls myalgias. Continue diet.

## 2013-04-05 ENCOUNTER — Encounter: Payer: PRIVATE HEALTH INSURANCE | Admitting: Physician Assistant

## 2013-05-08 ENCOUNTER — Other Ambulatory Visit: Payer: Self-pay | Admitting: Cardiovascular Disease

## 2013-11-16 ENCOUNTER — Other Ambulatory Visit: Payer: Self-pay | Admitting: Cardiovascular Disease

## 2013-12-09 ENCOUNTER — Ambulatory Visit (INDEPENDENT_AMBULATORY_CARE_PROVIDER_SITE_OTHER): Payer: BC Managed Care – PPO

## 2013-12-09 ENCOUNTER — Telehealth: Payer: Self-pay | Admitting: Family Medicine

## 2013-12-09 ENCOUNTER — Ambulatory Visit (INDEPENDENT_AMBULATORY_CARE_PROVIDER_SITE_OTHER): Payer: BC Managed Care – PPO | Admitting: Family Medicine

## 2013-12-09 ENCOUNTER — Encounter: Payer: Self-pay | Admitting: Family Medicine

## 2013-12-09 VITALS — BP 125/76 | HR 79 | Temp 97.3°F | Ht 75.0 in | Wt 259.6 lb

## 2013-12-09 DIAGNOSIS — R0602 Shortness of breath: Secondary | ICD-10-CM

## 2013-12-09 DIAGNOSIS — J209 Acute bronchitis, unspecified: Secondary | ICD-10-CM

## 2013-12-09 MED ORDER — HYDROCODONE-ACETAMINOPHEN 5-325 MG PO TABS: 1.0000 | ORAL_TABLET | Freq: Four times a day (QID) | ORAL | Status: DC | PRN

## 2013-12-09 MED ORDER — BUDESONIDE-FORMOTEROL FUMARATE 160-4.5 MCG/ACT IN AERO: 2.0000 | INHALATION_SPRAY | Freq: Two times a day (BID) | RESPIRATORY_TRACT | Status: DC

## 2013-12-09 MED ORDER — LEVOFLOXACIN 500 MG PO TABS: 500.0000 mg | ORAL_TABLET | Freq: Every day | ORAL | Status: DC

## 2013-12-09 MED ORDER — ALBUTEROL SULFATE HFA 108 (90 BASE) MCG/ACT IN AERS: 2.0000 | INHALATION_SPRAY | Freq: Four times a day (QID) | RESPIRATORY_TRACT | Status: DC | PRN

## 2013-12-09 NOTE — Progress Notes (Signed)
   Subjective:    Patient ID: CRUISE BAUMGARDNER, male    DOB: [birth year], 48 y.o.   MRN: [id number]  HPI This 48 y.o. male presents for evaluation of bronchitis and hx of copd.   Review of Systems C/o cough and uri sx's No chest pain, SOB, HA, dizziness, vision change, N/V, diarrhea, constipation, dysuria, urinary urgency or frequency, myalgias, arthralgias or rash.     Objective:   Physical Exam Vital signs noted  Well developed well nourished male.  HEENT - Head atraumatic Normocephalic                Eyes - PERRLA, Conjuctiva - clear Sclera- Clear EOMI                Ears - EAC's Wnl TM's Wnl Gross Hearing WNL                Nose - Nares patent                 Throat - oropharanx wnl Respiratory - Lungs CTA bilateral Cardiac - RRR S1 and S2 without murmur GI - Abdomen soft Nontender and bowel sounds active x 4 Extremities - No edema. Neuro - Grossly intact.       Assessment & Plan:  Shortness of breath - Plan: DG Chest 2 View  Bronchitis - Symbicort 160/4.5 2 puffs bid and levaquin 500mg  one po qd x 14 days Push po fluids, rest, tylenol and motrin otc prn as directed for fever, arthralgias, and myalgias.  Follow up prn if sx's continue or persist.  Lysbeth Penner FNP

## 2013-12-09 NOTE — Telephone Encounter (Signed)
appt given for today with bill 

## 2013-12-24 ENCOUNTER — Ambulatory Visit: Payer: Self-pay | Admitting: Family

## 2014-01-13 ENCOUNTER — Encounter: Payer: Self-pay | Admitting: Family Medicine

## 2014-01-13 ENCOUNTER — Ambulatory Visit (INDEPENDENT_AMBULATORY_CARE_PROVIDER_SITE_OTHER): Payer: BC Managed Care – PPO | Admitting: Family Medicine

## 2014-01-13 VITALS — BP 110/66 | HR 73 | Temp 97.2°F | Ht 75.0 in | Wt 262.6 lb

## 2014-01-13 DIAGNOSIS — Z23 Encounter for immunization: Secondary | ICD-10-CM

## 2014-01-13 DIAGNOSIS — J441 Chronic obstructive pulmonary disease with (acute) exacerbation: Secondary | ICD-10-CM

## 2014-01-13 DIAGNOSIS — Z720 Tobacco use: Secondary | ICD-10-CM

## 2014-01-13 DIAGNOSIS — J42 Unspecified chronic bronchitis: Secondary | ICD-10-CM

## 2014-01-13 DIAGNOSIS — Z0000 Encounter for general adult medical examination without abnormal findings: Secondary | ICD-10-CM

## 2014-01-13 LAB — POCT CBC
Granulocyte percent: 74.3 %G (ref 37–80)
HCT, POC: 48.2 % (ref 43.5–53.7)
Hemoglobin: 15.7 g/dL (ref 14.1–18.1)
Lymph, poc: 2.3 (ref 0.6–3.4)
MCH, POC: 30.5 pg (ref 27–31.2)
MCHC: 32.7 g/dL (ref 31.8–35.4)
MCV: 93.3 fL (ref 80–97)
MPV: 7.8 fL (ref 0–99.8)
POC Granulocyte: 7.1 — AB (ref 2–6.9)
POC LYMPH PERCENT: 24.1 %L (ref 10–50)
Platelet Count, POC: 278 10*3/uL (ref 142–424)
RBC: 5.2 M/uL (ref 4.69–6.13)
RDW, POC: 14.8 %
WBC: 9.6 10*3/uL (ref 4.6–10.2)

## 2014-01-13 MED ORDER — BUDESONIDE-FORMOTEROL FUMARATE 160-4.5 MCG/ACT IN AERO: 2.0000 | INHALATION_SPRAY | Freq: Two times a day (BID) | RESPIRATORY_TRACT | Status: DC

## 2014-01-13 NOTE — Progress Notes (Signed)
   Subjective:    Patient ID: William Carson, male    DOB: 12-09-1965, 48 y.o.   MRN: [id number]  HPI This 48 y.o. male presents for evaluation of routine visit and CPE.  He has hx of Copd and CAD. He sees cardiology annually and is due for appointment.  He is due for CPE labs.  He Has been using symbicort and it is helping his breathing and Copd.  He is still smoking and Is going to cut back.   Review of Systems No chest pain, SOB, HA, dizziness, vision change, N/V, diarrhea, constipation, dysuria, urinary urgency or frequency, myalgias, arthralgias or rash.     Objective:   Physical Exam  Vital signs noted  Well developed well nourished male.  HEENT - Head atraumatic Normocephalic                Eyes - PERRLA, Conjuctiva - clear Sclera- Clear EOMI                Ears - EAC's Wnl TM's Wnl Gross Hearing WNL                Nose - Nares patent                 Throat - oropharanx wnl Respiratory - Lungs CTA bilateral Cardiac - RRR S1 and S2 without murmur GI - Abdomen soft Nontender and bowel sounds active x 4 Extremities - No edema. Neuro - Grossly intact.      Assessment & Plan:  Need for Tdap vaccination - Plan: Tdap vaccine greater than or equal to 7yo IM, POCT CBC, Thyroid Panel With TSH, PSA, total and free, Lipid panel, CMP14+EGFR, Vit D  25 hydroxy (rtn osteoporosis monitoring), Vitamin B12  COPD exacerbation - Plan: POCT CBC, Thyroid Panel With TSH, PSA, total and free, Lipid panel, CMP14+EGFR, Vit D  25 hydroxy (rtn osteoporosis monitoring), Vitamin B12  Routine general medical examination at a health care facility - Plan: POCT CBC, Thyroid Panel With TSH, PSA, total and free, Lipid panel, CMP14+EGFR, Vit D  25 hydroxy (rtn osteoporosis monitoring), Vitamin B12  Tobacco abuse - Plan: POCT CBC, Thyroid Panel With TSH, PSA, total and free, Lipid panel, CMP14+EGFR, Vit D  25 hydroxy (rtn osteoporosis monitoring), Vitamin B12  Chronic bronchitis, unspecified chronic  bronchitis type - Plan: budesonide-formoterol (SYMBICORT) 160-4.5 MCG/ACT inhaler  Follow up in 6 months  Lysbeth Penner FNP

## 2014-01-14 ENCOUNTER — Other Ambulatory Visit: Payer: Self-pay | Admitting: Family Medicine

## 2014-01-14 LAB — CMP14+EGFR
ALT: 42 IU/L (ref 0–44)
AST: 25 IU/L (ref 0–40)
Albumin/Globulin Ratio: 2.3 (ref 1.1–2.5)
Albumin: 4.5 g/dL (ref 3.5–5.5)
Alkaline Phosphatase: 46 IU/L (ref 39–117)
BUN/Creatinine Ratio: 9 (ref 9–20)
BUN: 8 mg/dL (ref 6–24)
CO2: 23 mmol/L (ref 18–29)
Calcium: 9.6 mg/dL (ref 8.7–10.2)
Chloride: 102 mmol/L (ref 97–108)
Creatinine, Ser: 0.87 mg/dL (ref 0.76–1.27)
GFR calc Af Amer: 117 mL/min/{1.73_m2} (ref >59)
GFR calc non Af Amer: 101 mL/min/{1.73_m2} (ref >59)
Globulin, Total: 2 g/dL (ref 1.5–4.5)
Glucose: 81 mg/dL (ref 65–99)
Potassium: 4.2 mmol/L (ref 3.5–5.2)
Sodium: 143 mmol/L (ref 134–144)
Total Bilirubin: 0.4 mg/dL (ref 0.0–1.2)
Total Protein: 6.5 g/dL (ref 6.0–8.5)

## 2014-01-14 LAB — LIPID PANEL
Chol/HDL Ratio: 6.3 ratio units — ABNORMAL HIGH (ref 0.0–5.0)
Cholesterol, Total: 209 mg/dL — ABNORMAL HIGH (ref 100–199)
HDL: 33 mg/dL — ABNORMAL LOW (ref >39)
LDL Calculated: 143 mg/dL — ABNORMAL HIGH (ref 0–99)
Triglycerides: 167 mg/dL — ABNORMAL HIGH (ref 0–149)
VLDL Cholesterol Cal: 33 mg/dL (ref 5–40)

## 2014-01-14 LAB — PSA, TOTAL AND FREE
PSA, Free Pct: 17.6 %
PSA, Free: 0.37 ng/mL
PSA: 2.1 ng/mL (ref 0.0–4.0)

## 2014-01-14 LAB — THYROID PANEL WITH TSH
Free Thyroxine Index: 1.9 (ref 1.2–4.9)
T3 Uptake Ratio: 26 % (ref 24–39)
T4, Total: 7.4 ug/dL (ref 4.5–12.0)
TSH: 3.03 u[IU]/mL (ref 0.450–4.500)

## 2014-01-14 LAB — VITAMIN D 25 HYDROXY (VIT D DEFICIENCY, FRACTURES): Vit D, 25-Hydroxy: 26.5 ng/mL — ABNORMAL LOW (ref 30.0–100.0)

## 2014-01-14 LAB — VITAMIN B12: Vitamin B-12: 599 pg/mL (ref 211–946)

## 2014-01-17 ENCOUNTER — Telehealth: Payer: Self-pay | Admitting: Family Medicine

## 2014-01-17 NOTE — Telephone Encounter (Signed)
Message copied by Waverly Ferrari on Mon Jan 17, 2014 12:25 PM ------      Message from: Lysbeth Penner      Created: Fri Jan 14, 2014  8:48 AM       Lipids elevated and he could not tolerate pravastatin.  Consider new statin that has very low side effects Livalo and I have card here in office if agrees will send in rx. ------

## 2014-02-21 ENCOUNTER — Other Ambulatory Visit: Payer: Self-pay | Admitting: Family Medicine

## 2014-02-28 ENCOUNTER — Ambulatory Visit (INDEPENDENT_AMBULATORY_CARE_PROVIDER_SITE_OTHER): Payer: BC Managed Care – PPO | Admitting: Family

## 2014-02-28 ENCOUNTER — Encounter: Payer: Self-pay | Admitting: Family

## 2014-02-28 VITALS — BP 111/71 | HR 81 | Temp 96.6°F | Ht 75.0 in | Wt 263.0 lb

## 2014-02-28 DIAGNOSIS — L500 Allergic urticaria: Secondary | ICD-10-CM

## 2014-02-28 MED ORDER — METHYLPREDNISOLONE (PAK) 4 MG PO TABS: ORAL_TABLET | ORAL | Status: DC

## 2014-02-28 MED ORDER — TRIAMCINOLONE ACETONIDE 0.5 % EX OINT: 1.0000 "application " | TOPICAL_OINTMENT | Freq: Two times a day (BID) | CUTANEOUS | Status: DC

## 2014-02-28 NOTE — Patient Instructions (Signed)
Hives Hives are itchy, red, swollen areas of the skin. They can vary in size and location on your body. Hives can come and go for hours or several days (acute hives) or for several weeks (chronic hives). Hives do not spread from person to person (noncontagious). They may get worse with scratching, exercise, and emotional stress. CAUSES   Allergic reaction to food, additives, or drugs.  Infections, including the common cold.  Illness, such as vasculitis, lupus, or thyroid disease.  Exposure to sunlight, heat, or cold.  Exercise.  Stress.  Contact with chemicals. SYMPTOMS   Red or white swollen patches on the skin. The patches may change size, shape, and location quickly and repeatedly.  Itching.  Swelling of the hands, feet, and face. This may occur if hives develop deeper in the skin. DIAGNOSIS  Your caregiver can usually tell what is wrong by performing a physical exam. Skin or blood tests may also be done to determine the cause of your hives. In some cases, the cause cannot be determined. TREATMENT  Mild cases usually get better with medicines such as antihistamines. Severe cases may require an emergency epinephrine injection. If the cause of your hives is known, treatment includes avoiding that trigger.  HOME CARE INSTRUCTIONS   Avoid causes that trigger your hives.  Take antihistamines as directed by your caregiver to reduce the severity of your hives. Non-sedating or low-sedating antihistamines are usually recommended. Do not drive while taking an antihistamine.  Take any other medicines prescribed for itching as directed by your caregiver.  Wear loose-fitting clothing.  Keep all follow-up appointments as directed by your caregiver. SEEK MEDICAL CARE IF:   You have persistent or severe itching that is not relieved with medicine.  You have painful or swollen joints. SEEK IMMEDIATE MEDICAL CARE IF:   You have a fever.  Your tongue or lips are swollen.  You have  trouble breathing or swallowing.  You feel tightness in the throat or chest.  You have abdominal pain. These problems may be the first sign of a life-threatening allergic reaction. Call your local emergency services (911 in [place] MAKE SURE YOU:   Understand these instructions.  Will watch your condition.  Will get help right away if you are not doing well or get worse. Document Released: 07/08/2005 Document Revised: 07/13/2013 Document Reviewed: 10/01/2011 Exit[employer] Patient Information 2015 [employer]. This information is not intended to replace advice given to you by your health care provider. Make sure you discuss any questions you have with your health care provider.  

## 2014-02-28 NOTE — Progress Notes (Signed)
   Subjective:    Patient ID: William Carson, male    DOB: 1965/10/23, 48 y.o.   MRN: [id number]  Urticaria This is a new problem. The current episode started yesterday. The problem has been gradually improving since onset. The affected locations include the right arm, left arm, abdomen, left upper leg, chest, back and neck. The rash is characterized by itchiness and dryness. He was exposed to nothing. Associated symptoms include diarrhea. Pertinent negatives include no congestion, cough, eye pain, facial edema, fatigue, fever, joint pain, rhinorrhea, shortness of breath or sore throat. Past treatments include antihistamine. The treatment provided mild relief.      Review of Systems  Constitutional: Negative.  Negative for fever and fatigue.  HENT: Negative.  Negative for congestion, rhinorrhea and sore throat.   Eyes: Negative for pain.  Respiratory: Negative.  Negative for cough and shortness of breath.   Cardiovascular: Negative.   Gastrointestinal: Positive for diarrhea.  Endocrine: Negative.   Genitourinary: Negative.   Musculoskeletal: Negative.  Negative for joint pain.  Neurological: Negative.   Hematological: Negative.   Psychiatric/Behavioral: Negative.   All other systems reviewed and are negative.      Objective:   Physical Exam  Vitals reviewed. Constitutional: He is oriented to person, place, and time. He appears well-developed and well-nourished. No distress.  HENT:  Head: Normocephalic.  Right Ear: External ear normal.  Left Ear: External ear normal.  Nose: Nose normal.  Mouth/Throat: Oropharynx is clear and moist.  Eyes: Pupils are equal, round, and reactive to light. Right eye exhibits no discharge. Left eye exhibits no discharge.  Neck: Normal range of motion. Neck supple. No thyromegaly present.  Cardiovascular: Normal rate, regular rhythm, normal heart sounds and intact distal pulses.   No murmur heard. Pulmonary/Chest: Effort normal and breath sounds  normal. No respiratory distress. He has no wheezes.  Abdominal: Soft. Bowel sounds are normal. He exhibits no distension. There is no tenderness.  Musculoskeletal: Normal range of motion. He exhibits no edema and no tenderness.  Neurological: He is alert and oriented to person, place, and time. He has normal reflexes. No cranial nerve deficit.  Skin: Skin is warm and dry. No rash noted. No erythema.  Psychiatric: He has a normal mood and affect. His behavior is normal. Judgment and thought content normal.    BP 111/71  Pulse 81  Temp(Src) 96.6 F (35.9 C) (Oral)  Ht 6\' 3"  (1.905 m)  Wt 263 lb (119.296 kg)  BMI 32.87 kg/m2       Assessment & Plan:  1. Allergic urticaria -PT told to keep diary of daily activies when he has an episode- Including everything he has ate in the last 24 hours, activities  , ect -Pt has appointment with allergen specialists   on 05-11-14  -Pt told if he ever feels like his airway is closing or breathing is effected to go straight to ED - triamcinolone ointment (KENALOG) 0.5 %; Apply 1 application topically 2 (two) times daily.  Dispense: 30 g; Refill: 0 - methylPREDNIsolone (MEDROL DOSPACK) 4 MG tablet; follow package directions  Dispense: 21 tablet; Refill: 0   [name], FNP

## 2014-04-21 ENCOUNTER — Encounter: Payer: Self-pay | Admitting: Cardiology

## 2014-04-21 ENCOUNTER — Ambulatory Visit (INDEPENDENT_AMBULATORY_CARE_PROVIDER_SITE_OTHER): Payer: BC Managed Care – PPO | Admitting: Cardiology

## 2014-04-21 VITALS — BP 104/60 | HR 87 | Ht 75.0 in | Wt 270.0 lb

## 2014-04-21 DIAGNOSIS — I2510 Atherosclerotic heart disease of native coronary artery without angina pectoris: Secondary | ICD-10-CM

## 2014-04-21 DIAGNOSIS — Z720 Tobacco use: Secondary | ICD-10-CM

## 2014-04-21 NOTE — Addendum Note (Signed)
Addended by: Cristopher Estimable on: 04/21/2014 03:54 PM   Modules accepted: Orders

## 2014-04-21 NOTE — Assessment & Plan Note (Signed)
Continue aspirin. Intolerant to statins. Schedule exercise treadmill for risk stratification.

## 2014-04-21 NOTE — Assessment & Plan Note (Signed)
Patient counseled on discontinuing. 

## 2014-04-21 NOTE — Assessment & Plan Note (Signed)
Intolerant to statins. Continue diet. 

## 2014-04-21 NOTE — Patient Instructions (Addendum)
Your physician wants you to follow-up in: ONE YEAR WITH DR CRENSHAW You will receive a reminder letter in the mail two months in advance. If you don't receive a letter, please call our office to schedule the follow-up appointment.   Your physician has requested that you have an exercise tolerance test. For further information please visit [email or web address]. Please also follow instruction sheet, as given.    Exercise Stress Electrocardiogram  An exercise stress electrocardiogram is a test to check how blood flows to your heart. It is done to find areas of poor blood flow. You will need to walk on a treadmill for this test. The electrocardiogram will record your heartbeat when you are at rest and when you are exercising. BEFORE THE PROCEDURE  Do not have drinks with caffeine or foods with caffeine for 24 hours before the test, or as told by your doctor. This includes coffee, tea (even decaf tea), sodas, chocolate, and cocoa.  Follow your doctor's instructions about eating and drinking before the test.  Ask your doctor what medicines you should or should not take before the test. Take your medicines with water unless told by your doctor not to.  If you use an inhaler, bring it with you to the test.  Bring a snack to eat after the test.  Do not  smoke for 4 hours before the test.  Do not put lotions, powders, creams, or oils on your chest before the test.  Wear comfortable shoes and clothing. PROCEDURE  You will have patches put on your chest. Small areas of your chest may need to be shaved. Wires will be connected to the patches.  Your heart rate will be watched while you are resting and while you are exercising.  You will walk on the treadmill. The treadmill will slowly get faster to raise your heart rate.  The test will take about 1-2 hours. AFTER THE PROCEDURE  Your heart rate and blood pressure will be watched after the test.  You may return to your normal diet, activities,  and medicines or as told by your doctor. Document Released: 12/25/2007 Document Revised: 11/22/2013 Document Reviewed: 03/15/2013 Exit[employer] Patient Information 2015 [employer]. This information is not intended to replace advice given to you by your health care provider. Make sure you discuss any questions you have with your health care provider.  

## 2014-04-21 NOTE — Progress Notes (Signed)
      HPI: FU coronary artery disease. Admitted in November of 2011 and ruled in for a subendocardial myocardial infarctions. Cardiac catheterization performed on June 19, 2010 revealed normal LM; 30% proximal LAD; normal Lcx; 90% RCA; Normal EF; patient had 2 drug-eluting stents to the right coronary artery at that time. At last OV 8/14, ETT planned but not performed. Since I last saw him, the patient has dyspnea with more extreme activities but not with routine activities. It is relieved with rest. It is not associated with chest pain. There is no orthopnea, PND or pedal edema. There is no syncope or palpitations. There is no exertional chest pain.    Current Outpatient Prescriptions  Medication Sig Dispense Refill  . albuterol (PROVENTIL HFA;VENTOLIN HFA) 108 (90 BASE) MCG/ACT inhaler Inhale 2 puffs into the lungs every 6 (six) hours as needed for wheezing or shortness of breath.  1 Inhaler  0  . aspirin 81 MG EC tablet Take 81 mg by mouth daily.        . budesonide-formoterol (SYMBICORT) 160-4.5 MCG/ACT inhaler Inhale 2 puffs into the lungs 2 (two) times daily.  1 Inhaler  12  . HYDROcodone-acetaminophen (NORCO) 5-325 MG per tablet Take 1 tablet by mouth every 6 (six) hours as needed for moderate pain.  30 tablet  0  . metoprolol tartrate (LOPRESSOR) 25 MG tablet TAKE ONE TABLET BY MOUTH TWICE DAILY  60 tablet  4  . nitroGLYCERIN (NITROSTAT) 0.4 MG SL tablet Place 0.4 mg under the tongue every 5 (five) minutes as needed. May repeat up to 3 doses.        No current facility-administered medications for this visit.     Past Medical History  Diagnosis Date  . Hyperlipidemia   . Coronary artery disease   . Obesity   . COPD (chronic obstructive pulmonary disease)   . Adenomatous polyps   . Diverticulosis   . Hemorrhoids     Past Surgical History  Procedure Laterality Date  . Anterior cruciate ligament repair      S/P    History   Social History  . Marital Status: Married      Spouse Name: N/A    Number of Children: N/A  . Years of Education: N/A   Occupational History  . [employer] Other    [place], [place]   Social History Main Topics  . Smoking status: Current Every Day Smoker -- 1.00 packs/day for 30 years  . Smokeless tobacco: Not on file  . Alcohol Use: 0.5 oz/week    1 drink(s) per week  . Drug Use: No  . Sexual Activity: Not on file   Other Topics Concern  . Not on file   Social History Narrative   Has been married twice   Married to current wife for 3 years   No children    ROS: no fevers or chills, productive cough, hemoptysis, dysphasia, odynophagia, melena, hematochezia, dysuria, hematuria, rash, seizure activity, orthopnea, PND, pedal edema, claudication. Remaining systems are negative.  Physical Exam: Well-developed well-nourished in no acute distress.  Skin is warm and dry.  HEENT is normal.  Neck is supple.  Chest is clear to auscultation with normal expansion.  Cardiovascular exam is regular rate and rhythm.  Abdominal exam nontender or distended. No masses palpated. Extremities show no edema. neuro grossly intact  ECG Sinus rhythm at a rate of 87. Right axis deviation.

## 2014-05-06 ENCOUNTER — Telehealth (HOSPITAL_COMMUNITY): Payer: Self-pay

## 2014-05-06 NOTE — Telephone Encounter (Signed)
Encounter complete. 

## 2014-05-11 ENCOUNTER — Encounter (HOSPITAL_COMMUNITY): Payer: BC Managed Care – PPO

## 2014-07-19 ENCOUNTER — Other Ambulatory Visit: Payer: Self-pay | Admitting: Family Medicine

## 2014-10-06 ENCOUNTER — Other Ambulatory Visit: Payer: Self-pay | Admitting: Family Medicine

## 2014-11-03 ENCOUNTER — Other Ambulatory Visit: Payer: Self-pay | Admitting: Family

## 2014-12-09 ENCOUNTER — Other Ambulatory Visit: Payer: Self-pay | Admitting: Family

## 2015-01-08 ENCOUNTER — Other Ambulatory Visit: Payer: Self-pay | Admitting: Family Medicine

## 2015-01-10 ENCOUNTER — Telehealth: Payer: Self-pay | Admitting: Cardiology

## 2015-01-10 MED ORDER — METOPROLOL TARTRATE 25 MG PO TABS: 25.0000 mg | ORAL_TABLET | Freq: Two times a day (BID) | ORAL | Status: DC

## 2015-01-10 NOTE — Telephone Encounter (Signed)
New Message   Pt wants to speak to Rn because he is not due to see Dr. Stanford Breed   Until October for his yearly.  The pt states his pharmacy will not fill his medication until he is seen by the dr.  Please call pt

## 2015-01-10 NOTE — Telephone Encounter (Signed)
Refill submitted to patient's preferred pharmacy. Informed patient. Pt voiced understanding, no other stated concerns at this time.  

## 2015-04-12 ENCOUNTER — Encounter: Payer: Self-pay | Admitting: Family Medicine

## 2015-04-12 ENCOUNTER — Ambulatory Visit (INDEPENDENT_AMBULATORY_CARE_PROVIDER_SITE_OTHER): Payer: 59 | Admitting: Family Medicine

## 2015-04-12 DIAGNOSIS — R5382 Chronic fatigue, unspecified: Secondary | ICD-10-CM

## 2015-04-12 DIAGNOSIS — J42 Unspecified chronic bronchitis: Secondary | ICD-10-CM | POA: Diagnosis not present

## 2015-04-12 DIAGNOSIS — J439 Emphysema, unspecified: Secondary | ICD-10-CM

## 2015-04-12 DIAGNOSIS — R7989 Other specified abnormal findings of blood chemistry: Secondary | ICD-10-CM | POA: Diagnosis not present

## 2015-04-12 DIAGNOSIS — E785 Hyperlipidemia, unspecified: Secondary | ICD-10-CM | POA: Diagnosis not present

## 2015-04-12 DIAGNOSIS — E559 Vitamin D deficiency, unspecified: Secondary | ICD-10-CM | POA: Insufficient documentation

## 2015-04-12 MED ORDER — BUDESONIDE-FORMOTEROL FUMARATE 160-4.5 MCG/ACT IN AERO: 2.0000 | INHALATION_SPRAY | Freq: Two times a day (BID) | RESPIRATORY_TRACT | Status: DC

## 2015-04-12 MED ORDER — HYDROCODONE-ACETAMINOPHEN 5-325 MG PO TABS: 1.0000 | ORAL_TABLET | Freq: Four times a day (QID) | ORAL | Status: DC | PRN

## 2015-04-12 NOTE — Assessment & Plan Note (Signed)
Patient currently takes Symbicort and has a rescue inhaler as needed. He has been stable on Symbicort and only uses his rescue inhaler maybe once a month or less. Continue current medication

## 2015-04-12 NOTE — Progress Notes (Signed)
BP 129/81 mmHg  Pulse 60  Temp(Src) 97 F (36.1 C) (Oral)  Ht _0  (1.905 m)  Wt 279 lb 6.4 oz (126.735 kg)  BMI 34.92 kg/m2   Subjective:    Patient ID: William Carson, male    DOB: 05/29/1966, 49 y.o.   MRN: [id number]  HPI: William Carson is a 49 y.o. male presenting on 04/12/2015 for Annual Exam and Prescription refills   HPI COPD Patient has had COPD and is currently being treated for with Symbicort. He is doing well on his Symbicort that he takes only in the morning and rarely has to use his rescue inhaler except for maybe once a month. He denies any coughing spells or episodes or wheezing or shortness of breath during the day or at night. He denies any hospitalizations for his breathing. He stopped smoking at one point but has since restarted and is currently smoking a pack per day. He plans to use some over-the-counter nicotine patches to help him stop which is when he used previously.  Hyperlipidemia Patient has been diagnosed with hyperlipidemia in the past but is on a medication for it. He has not had labs done in over a year to recheck. Patient denies headaches, blurred vision, chest pains, shortness of breath, or weakness.   Fatigue Patient has had increased fatigue and lack of energy over the past 3-4 months. He denies any feelings or signs of depression or sadness or crying. He denies any other life changes recently or any medication changes. He denies any chest palpitations or significant weight changes or sleeping issues.  Low vitamin D Hearing on his labs his vitamin D was low and he did not do treatment for it at that point but has been outside more over the summer. We will recheck his vitamin D levels today with labs.  Relevant past medical, surgical, family and social history reviewed and updated as indicated. Interim medical history since our last visit reviewed. Allergies and medications reviewed and updated.  Review of Systems  Constitutional: Positive for  fatigue. Negative for fever and chills.  HENT: Negative for ear discharge and ear pain.   Eyes: Negative for discharge and visual disturbance.  Respiratory: Negative for cough, shortness of breath and wheezing.   Cardiovascular: Negative for chest pain and leg swelling.  Gastrointestinal: Negative for abdominal pain, diarrhea and constipation.  Genitourinary: Negative for difficulty urinating.  Musculoskeletal: Negative for back pain and gait problem.  Skin: Negative for rash.  Neurological: Negative for dizziness, tremors, syncope, weakness, light-headedness and headaches.  All other systems reviewed and are negative.   Per HPI unless specifically indicated above     Medication List       This list is accurate as of: 04/12/15  1:08 PM.  Always use your most recent med list.               albuterol 108 (90 BASE) MCG/ACT inhaler  Commonly known as:  PROVENTIL HFA;VENTOLIN HFA  Inhale 2 puffs into the lungs every 6 (six) hours as needed for wheezing or shortness of breath.     aspirin 81 MG EC tablet  Take 81 mg by mouth daily.     budesonide-formoterol 160-4.5 MCG/ACT inhaler  Commonly known as:  SYMBICORT  Inhale 2 puffs into the lungs 2 (two) times daily.     HYDROcodone-acetaminophen 5-325 MG per tablet  Commonly known as:  NORCO  Take 1 tablet by mouth every 6 (six) hours as needed for moderate  pain.     metoprolol tartrate 25 MG tablet  Commonly known as:  LOPRESSOR  Take 1 tablet (25 mg total) by mouth 2 (two) times daily.     nitroGLYCERIN 0.4 MG SL tablet  Commonly known as:  NITROSTAT  Place 0.4 mg under the tongue every 5 (five) minutes as needed. May repeat up to 3 doses.           Objective:    BP 129/81 mmHg  Pulse 60  Temp(Src) 97 F (36.1 C) (Oral)  Ht _0  (1.905 m)  Wt 279 lb 6.4 oz (126.735 kg)  BMI 34.92 kg/m2  Wt Readings from Last 3 Encounters:  04/12/15 279 lb 6.4 oz (126.735 kg)  04/21/14 270 lb (122.471 kg)  02/28/14 263 lb  (119.296 kg)    Physical Exam  Constitutional: He is oriented to person, place, and time. He appears well-developed and well-nourished. No distress.  Eyes: Conjunctivae and EOM are normal. Pupils are equal, round, and reactive to light. Right eye exhibits no discharge. No scleral icterus.  Neck: Neck supple. No thyromegaly present.  Cardiovascular: Normal rate, regular rhythm, normal heart sounds and intact distal pulses.   No murmur heard. Pulmonary/Chest: Effort normal and breath sounds normal. No respiratory distress. He has no wheezes.  Musculoskeletal: Normal range of motion. He exhibits no edema.  Lymphadenopathy:    He has no cervical adenopathy.  Neurological: He is alert and oriented to person, place, and time. He displays normal reflexes. No cranial nerve deficit. He exhibits normal muscle tone. Coordination normal.  Skin: Skin is warm and dry. No rash noted. He is not diaphoretic.  Psychiatric: He has a normal mood and affect. His behavior is normal.  Vitals reviewed.   Results for orders placed or performed in visit on 01/13/14  Thyroid Panel With TSH  Result Value Ref Range   TSH 3.030 0.450 - 4.500 uIU/mL   T4, Total 7.4 4.5 - 12.0 ug/dL   T3 Uptake Ratio 26 24 - 39 %   Free Thyroxine Index 1.9 1.2 - 4.9  PSA, total and free  Result Value Ref Range   PSA 2.1 0.0 - 4.0 ng/mL   PSA, Free 0.37 N/A ng/mL   PSA, Free Pct 17.6 %  Lipid panel  Result Value Ref Range   Cholesterol, Total 209 (H) 100 - 199 mg/dL   Triglycerides 167 (H) 0 - 149 mg/dL   HDL 33 (L) >39 mg/dL   VLDL Cholesterol Cal 33 5 - 40 mg/dL   LDL Calculated 143 (H) 0 - 99 mg/dL   Chol/HDL Ratio 6.3 (H) 0.0 - 5.0 ratio units  CMP14+EGFR  Result Value Ref Range   Glucose 81 65 - 99 mg/dL   BUN 8 6 - 24 mg/dL   Creatinine, Ser 0.87 0.76 - 1.27 mg/dL   GFR calc non Af Amer 101 >59 mL/min/1.73   GFR calc Af Amer 117 >59 mL/min/1.73   BUN/Creatinine Ratio 9 9 - 20   Sodium 143 134 - 144 mmol/L    Potassium 4.2 3.5 - 5.2 mmol/L   Chloride 102 97 - 108 mmol/L   CO2 23 18 - 29 mmol/L   Calcium 9.6 8.7 - 10.2 mg/dL   Total Protein 6.5 6.0 - 8.5 g/dL   Albumin 4.5 3.5 - 5.5 g/dL   Globulin, Total 2.0 1.5 - 4.5 g/dL   Albumin/Globulin Ratio 2.3 1.1 - 2.5   Total Bilirubin 0.4 0.0 - 1.2 mg/dL   Alkaline Phosphatase 46  39 - 117 IU/L   AST 25 0 - 40 IU/L   ALT 42 0 - 44 IU/L  Vit D  25 hydroxy (rtn osteoporosis monitoring)  Result Value Ref Range   Vit D, 25-Hydroxy 26.5 (L) 30.0 - 100.0 ng/mL  Vitamin B12  Result Value Ref Range   Vitamin B-12 599 211 - 946 pg/mL  POCT CBC  Result Value Ref Range   WBC 9.6 4.6 - 10.2 K/uL   Lymph, poc 2.3 0.6 - 3.4   POC LYMPH PERCENT 24.1 10 - 50 %L   MID (cbc)  0 - 0.9   POC MID %  0 - 12 %M   POC Granulocyte 7.1 (A) 2 - 6.9   Granulocyte percent 74.3 37 - 80 %G   RBC 5.2 4.69 - 6.13 M/uL   Hemoglobin 15.7 14.1 - 18.1 g/dL   HCT, POC 48.2 43.5 - 53.7 %   MCV 93.3 80 - 97 fL   MCH, POC 30.5 27 - 31.2 pg   MCHC 32.7 31.8 - 35.4 g/dL   RDW, POC 14.8 %   Platelet Count, POC 278.0 142 - 424 K/uL   MPV 7.8 0 - 99.8 fL      Assessment & Plan:   Problem List Items Addressed This Visit      Respiratory   COPD (chronic obstructive pulmonary disease)    Patient currently takes Symbicort and has a rescue inhaler as needed. He has been stable on Symbicort and only uses his rescue inhaler maybe once a month or less. Continue current medication      Relevant Medications   budesonide-formoterol (SYMBICORT) 160-4.5 MCG/ACT inhaler     Other   Hyperlipidemia    Will recheck some labs today.      Relevant Orders   CMP14+EGFR   Lipid panel   Low serum vitamin D   Relevant Orders   Vit D  25 hydroxy (rtn osteoporosis monitoring)    Other Visit Diagnoses    Chronic fatigue        Patient has fatigue for the past 3 or 4 months and we'll check thyroid panel.    Relevant Orders    Thyroid Panel With TSH        Follow up plan: Return in  about 6 months (around 10/10/2015), or if symptoms worsen or fail to improve, for COPD and Hyperlipidemia.  Caryl Pina, MD [facility] 04/12/2015, 1:08 PM

## 2015-04-12 NOTE — Assessment & Plan Note (Signed)
Will recheck some labs today.

## 2015-04-13 LAB — THYROID PANEL WITH TSH
Free Thyroxine Index: 1.9 (ref 1.2–4.9)
T3 Uptake Ratio: 21 % — ABNORMAL LOW (ref 24–39)
T4, Total: 9.2 ug/dL (ref 4.5–12.0)
TSH: 2.4 u[IU]/mL (ref 0.450–4.500)

## 2015-04-13 LAB — CMP14+EGFR
A/G RATIO: 1.8 (ref 1.1–2.5)
ALT: 60 IU/L — ABNORMAL HIGH (ref 0–44)
AST: 31 IU/L (ref 0–40)
Albumin: 4.4 g/dL (ref 3.5–5.5)
Alkaline Phosphatase: 48 IU/L (ref 39–117)
BUN / CREAT RATIO: 13 (ref 9–20)
BUN: 12 mg/dL (ref 6–24)
Bilirubin Total: 0.4 mg/dL (ref 0.0–1.2)
CO2: 22 mmol/L (ref 18–29)
CREATININE: 0.93 mg/dL (ref 0.76–1.27)
Calcium: 9.7 mg/dL (ref 8.7–10.2)
Chloride: 101 mmol/L (ref 97–108)
GFR, EST AFRICAN AMERICAN: 110 mL/min/{1.73_m2} (ref >59)
GFR, EST NON AFRICAN AMERICAN: 95 mL/min/{1.73_m2} (ref >59)
GLOBULIN, TOTAL: 2.5 g/dL (ref 1.5–4.5)
Glucose: 88 mg/dL (ref 65–99)
POTASSIUM: 4.8 mmol/L (ref 3.5–5.2)
SODIUM: 141 mmol/L (ref 134–144)
TOTAL PROTEIN: 6.9 g/dL (ref 6.0–8.5)

## 2015-04-13 LAB — LIPID PANEL
CHOL/HDL RATIO: 6.7 ratio — AB (ref 0.0–5.0)
Cholesterol, Total: 207 mg/dL — ABNORMAL HIGH (ref 100–199)
HDL: 31 mg/dL — ABNORMAL LOW (ref >39)
LDL CALC: 151 mg/dL — AB (ref 0–99)
TRIGLYCERIDES: 124 mg/dL (ref 0–149)
VLDL Cholesterol Cal: 25 mg/dL (ref 5–40)

## 2015-04-13 LAB — VITAMIN D 25 HYDROXY (VIT D DEFICIENCY, FRACTURES): VIT D 25 HYDROXY: 23.2 ng/mL — AB (ref 30.0–100.0)

## 2015-08-01 ENCOUNTER — Other Ambulatory Visit: Payer: Self-pay | Admitting: Cardiology

## 2015-08-01 MED ORDER — METOPROLOL TARTRATE 25 MG PO TABS: 25.0000 mg | ORAL_TABLET | Freq: Two times a day (BID) | ORAL | Status: DC

## 2015-08-01 NOTE — Telephone Encounter (Signed)
°*  STAT* If patient is at the pharmacy, call can be transferred to refill team.   1. Which medications need to be refilled? (please list name of each medication and dose if known)   metoprolol tartrate (LOPRESSOR) 25 MG tablet   2. Which pharmacy/location (including street and city if local pharmacy) is medication to be sent to? [facility]   3. Do they need a 30 day or 90 day supply? 30 day   Pt made appt for 01/13 @ 11:30a

## 2015-08-04 ENCOUNTER — Encounter: Payer: Self-pay | Admitting: Cardiology

## 2015-08-04 ENCOUNTER — Ambulatory Visit (INDEPENDENT_AMBULATORY_CARE_PROVIDER_SITE_OTHER): Payer: BLUE CROSS/BLUE SHIELD | Admitting: Cardiology

## 2015-08-04 VITALS — BP 122/88 | HR 71 | Ht 75.0 in | Wt 275.6 lb

## 2015-08-04 DIAGNOSIS — E785 Hyperlipidemia, unspecified: Secondary | ICD-10-CM | POA: Diagnosis not present

## 2015-08-04 DIAGNOSIS — I2510 Atherosclerotic heart disease of native coronary artery without angina pectoris: Secondary | ICD-10-CM | POA: Diagnosis not present

## 2015-08-04 DIAGNOSIS — J438 Other emphysema: Secondary | ICD-10-CM

## 2015-08-04 DIAGNOSIS — Z9861 Coronary angioplasty status: Secondary | ICD-10-CM

## 2015-08-04 DIAGNOSIS — E669 Obesity, unspecified: Secondary | ICD-10-CM | POA: Diagnosis not present

## 2015-08-04 DIAGNOSIS — Z720 Tobacco use: Secondary | ICD-10-CM | POA: Diagnosis not present

## 2015-08-04 MED ORDER — METOPROLOL TARTRATE 25 MG PO TABS: 25.0000 mg | ORAL_TABLET | Freq: Two times a day (BID) | ORAL | Status: DC

## 2015-08-04 MED ORDER — NITROGLYCERIN 0.4 MG SL SUBL: 0.4000 mg | SUBLINGUAL_TABLET | SUBLINGUAL | Status: DC | PRN

## 2015-08-04 NOTE — Progress Notes (Signed)
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 He has a history of CAD, s/p RCA DES Nov 2011. He has not had a cath or exercise study since. He is in the office today because the pharmacy wouldn't refill his Rxs. He denies chest pain. He is smoking about a pack a day. His LDL in Sep 2016 was 151. He says he couldn't take statin Rx previously secondary to myalgia- "I tried them all".    Current Outpatient Prescriptions  Medication Sig Dispense Refill  . albuterol (PROVENTIL HFA;VENTOLIN HFA) 108 (90 BASE) MCG/ACT inhaler Inhale 2 puffs into the lungs every 6 (six) hours as needed for wheezing or shortness of breath. 1 Inhaler 0  . aspirin 81 MG EC tablet Take 81 mg by mouth daily.      . budesonide-formoterol (SYMBICORT) 160-4.5 MCG/ACT inhaler Inhale 2 puffs into the lungs 2 (two) times daily. 1 Inhaler 11  . HYDROcodone-acetaminophen (NORCO) 5-325 MG per tablet Take 1 tablet by mouth every 6 (six) hours as needed for moderate pain. 30 tablet 0  . metoprolol tartrate (LOPRESSOR) 25 MG tablet Take 1 tablet (25 mg total) by mouth 2 (two) times daily. 60 tablet 11  . nitroGLYCERIN (NITROSTAT) 0.4 MG SL tablet Place 1 tablet (0.4 mg total) under the tongue every 5 (five) minutes as needed for chest pain. May repeat up to 3 doses. 25 tablet 3   No current facility-administered medications for this visit.    Allergies  Allergen Reactions  . Other Swelling    Mammelain meat allergy    Social History   Social History  . Marital Status: Married    Spouse Name: N/A  . Number of Children: N/A  . Years of Education: N/A   Occupational History  . [employer] Other    [place], [place]   Social History Main Topics  . Smoking status: Current Every Day Smoker -- 1.00 packs/day for 30 years  . Smokeless tobacco: Not on file  . Alcohol Use: 0.5  oz/week    1 Standard drinks or equivalent per week  . Drug Use: No  . Sexual Activity: Not on file   Other Topics Concern  . Not on file   Social History Narrative   Has been married twice   Married to current wife for 3 years   No children     Review of Systems: General: negative for chills, fever, night sweats or weight changes.  Cardiovascular: negative for chest pain, dyspnea on exertion, edema, orthopnea, palpitations, paroxysmal nocturnal dyspnea or shortness of breath Dermatological: negative for rash Respiratory: "bronchitis since Dec" Urologic: negative for hematuria Abdominal: negative for nausea, vomiting, diarrhea, bright red blood per rectum, melena, or hematemesis Neurologic: negative for visual changes, syncope, or dizziness All other systems reviewed and are otherwise negative except as noted above.    Blood pressure 122/88, pulse 71, height 6\' 3"  (1.905 m), weight 275 lb 9.6 oz (125.011 kg).  General appearance: alert, cooperative, no distress and moderately obese Neck: no carotid bruit and no JVD Lungs: decreased breath sounds, no wheezing Heart: regular rate and rhythm Skin: Skin color, texture, turgor normal. No rashes or lesions Neurologic: Grossly normal  EKG NSR  ASSESSMENT AND PLAN:   Hyperlipidemia LDL 151 in Sept 2016- statin intolerant- myalgias  Tobacco abuse Still smoking 1 ppd  Obesity (BMI 30-39.9) BMI  77  COPD (chronic obstructive pulmonary disease) Documented on CXR 2015    PLAN  We discussed the importance of smoking cessation, wgt loss, and treatment of his dyslipidemia. I have referred him to our Lipid clinic. He'll f/u with Dr Stanford Breed in 6 months and we'll see how he's done with smoking and wgt loss.   Kerin Ransom K PA-C 08/04/2015 12:02 PM

## 2015-08-04 NOTE — Assessment & Plan Note (Signed)
BMI 34 

## 2015-08-04 NOTE — Patient Instructions (Signed)
Kerin Ransom, [place], has made no changes today in your current medications or treatment plan.  Lurena Joiner has referred you to the lipid clinic with our pharmacist, Erasmo Downer.  Your physician recommends that you schedule a follow-up appointment in 6 months with Dr Stanford Breed. You will receive a reminder letter in the mail two months in advance. If you don't receive a letter, please call our office to schedule the follow-up appointment.  If you need a refill on your cardiac medications before your next appointment, please call your pharmacy.

## 2015-08-04 NOTE — Assessment & Plan Note (Signed)
Documented on CXR 2015

## 2015-08-04 NOTE — Assessment & Plan Note (Signed)
Still smoking 1 ppd

## 2015-08-04 NOTE — Assessment & Plan Note (Signed)
LDL 151 in Sept 2016- statin intolerant- myalgias

## 2015-10-17 ENCOUNTER — Ambulatory Visit (INDEPENDENT_AMBULATORY_CARE_PROVIDER_SITE_OTHER): Payer: BLUE CROSS/BLUE SHIELD | Admitting: Family

## 2015-10-17 ENCOUNTER — Encounter: Payer: Self-pay | Admitting: Family

## 2015-10-17 VITALS — BP 125/67 | HR 94 | Temp 101.1°F | Ht 75.0 in | Wt 270.0 lb

## 2015-10-17 DIAGNOSIS — R6889 Other general symptoms and signs: Secondary | ICD-10-CM

## 2015-10-17 DIAGNOSIS — J101 Influenza due to other identified influenza virus with other respiratory manifestations: Secondary | ICD-10-CM

## 2015-10-17 LAB — VERITOR FLU A/B WAIVED
Influenza A: POSITIVE — AB
Influenza B: NEGATIVE

## 2015-10-17 MED ORDER — HYDROCODONE-HOMATROPINE 5-1.5 MG/5ML PO SYRP: 5.0000 mL | ORAL_SOLUTION | Freq: Three times a day (TID) | ORAL | Status: DC | PRN

## 2015-10-17 MED ORDER — OSELTAMIVIR PHOSPHATE 75 MG PO CAPS: 75.0000 mg | ORAL_CAPSULE | Freq: Two times a day (BID) | ORAL | Status: DC

## 2015-10-17 NOTE — Progress Notes (Signed)
   Subjective:    Patient ID: William Carson, male    DOB: 10-05-1965, 50 y.o.   MRN: [id number]  Cough This is a new problem. Episode onset: Sunday. The problem has been gradually worsening. The problem occurs hourly. The cough is non-productive. Associated symptoms include chills, a fever, headaches, myalgias, nasal congestion, postnasal drip, rhinorrhea, a sore throat, shortness of breath and wheezing. Pertinent negatives include no ear congestion or ear pain. The symptoms are aggravated by lying down. Risk factors for lung disease include smoking/tobacco exposure. He has tried OTC cough suppressant and rest for the symptoms. The treatment provided mild relief. His past medical history is significant for COPD. There is no history of asthma.  Fever  Associated symptoms include coughing, headaches, a sore throat and wheezing. Pertinent negatives include no ear pain.  Headache  Associated symptoms include coughing, a fever, rhinorrhea and a sore throat. Pertinent negatives include no ear pain.      Review of Systems  Constitutional: Positive for fever and chills.  HENT: Positive for postnasal drip, rhinorrhea and sore throat. Negative for ear pain.   Respiratory: Positive for cough, shortness of breath and wheezing.   Cardiovascular: Negative.   Gastrointestinal: Negative.   Endocrine: Negative.   Genitourinary: Negative.   Musculoskeletal: Positive for myalgias.  Neurological: Positive for headaches.  Hematological: Negative.   Psychiatric/Behavioral: Negative.   All other systems reviewed and are negative.      Objective:   Physical Exam  Constitutional: He is oriented to person, place, and time. He appears well-developed and well-nourished. No distress.  HENT:  Head: Normocephalic.  Right Ear: External ear normal.  Left Ear: External ear normal. Tympanic membrane is erythematous.  Nasal passage erythemas with mild swelling  Oropharynx erythemas  Eyes: Pupils are equal,  round, and reactive to light. Right eye exhibits no discharge. Left eye exhibits no discharge.  Neck: Normal range of motion. Neck supple. No thyromegaly present.  Cardiovascular: Normal rate, regular rhythm, normal heart sounds and intact distal pulses.   No murmur heard. Pulmonary/Chest: Effort normal. No respiratory distress. He has no wheezes.  Tight constant cough   Abdominal: Soft. Bowel sounds are normal. He exhibits no distension. There is no tenderness.  Musculoskeletal: Normal range of motion. He exhibits no edema or tenderness.  Neurological: He is alert and oriented to person, place, and time.  Skin: Skin is warm and dry. No rash noted. No erythema.  Psychiatric: He has a normal mood and affect. His behavior is normal. Judgment and thought content normal.  Vitals reviewed.    BP 125/67 mmHg  Pulse 94  Temp(Src) 101.1 F (38.4 C) (Oral)  Ht 6\' 3"  (1.905 m)  Wt 270 lb (122.471 kg)  BMI 33.75 kg/m2      Assessment & Plan:  1. Flu-like symptoms - Veritor Flu A/B Waived  2. Influenza A - oseltamivir (TAMIFLU) 75 MG capsule; Take 1 capsule (75 mg total) by mouth 2 (two) times daily.  Dispense: 10 capsule; Refill: 0 - HYDROcodone-homatropine (HYCODAN) 5-1.5 MG/5ML syrup; Take 5 mLs by mouth every 8 (eight) hours as needed for cough.  Dispense: 120 mL; Refill: 0 [place] fluids Tylenol or Morin prn for fever or pain Good hand hygiene discussed RTO prn   [name], FNP

## 2015-10-17 NOTE — Patient Instructions (Signed)

## 2015-10-18 ENCOUNTER — Emergency Department (HOSPITAL_COMMUNITY): Payer: BLUE CROSS/BLUE SHIELD

## 2015-10-18 ENCOUNTER — Emergency Department (HOSPITAL_COMMUNITY)
Admission: EM | Admit: 2015-10-18 | Discharge: 2015-10-18 | Disposition: A | Discharge status: Home / Self Care | Payer: BLUE CROSS/BLUE SHIELD | Attending: Emergency Medicine | Admitting: Emergency Medicine

## 2015-10-18 ENCOUNTER — Encounter (HOSPITAL_COMMUNITY): Payer: Self-pay | Admitting: *Deleted

## 2015-10-18 DIAGNOSIS — B349 Viral infection, unspecified: Secondary | ICD-10-CM | POA: Diagnosis not present

## 2015-10-18 DIAGNOSIS — F17210 Nicotine dependence, cigarettes, uncomplicated: Secondary | ICD-10-CM | POA: Diagnosis not present

## 2015-10-18 DIAGNOSIS — J449 Chronic obstructive pulmonary disease, unspecified: Secondary | ICD-10-CM | POA: Insufficient documentation

## 2015-10-18 DIAGNOSIS — R55 Syncope and collapse: Secondary | ICD-10-CM | POA: Diagnosis present

## 2015-10-18 DIAGNOSIS — Z79899 Other long term (current) drug therapy: Secondary | ICD-10-CM | POA: Insufficient documentation

## 2015-10-18 DIAGNOSIS — I2510 Atherosclerotic heart disease of native coronary artery without angina pectoris: Secondary | ICD-10-CM | POA: Insufficient documentation

## 2015-10-18 DIAGNOSIS — Z7982 Long term (current) use of aspirin: Secondary | ICD-10-CM | POA: Diagnosis not present

## 2015-10-18 DIAGNOSIS — E785 Hyperlipidemia, unspecified: Secondary | ICD-10-CM | POA: Insufficient documentation

## 2015-10-18 DIAGNOSIS — Z79891 Long term (current) use of opiate analgesic: Secondary | ICD-10-CM | POA: Insufficient documentation

## 2015-10-18 DIAGNOSIS — E669 Obesity, unspecified: Secondary | ICD-10-CM | POA: Insufficient documentation

## 2015-10-18 LAB — BASIC METABOLIC PANEL
ANION GAP: 9 (ref 5–15)
BUN: 8 mg/dL (ref 6–20)
CALCIUM: 8.6 mg/dL — AB (ref 8.9–10.3)
CO2: 26 mmol/L (ref 22–32)
Chloride: 103 mmol/L (ref 101–111)
Creatinine, Ser: 0.92 mg/dL (ref 0.61–1.24)
Glucose, Bld: 113 mg/dL — ABNORMAL HIGH (ref 65–99)
POTASSIUM: 4 mmol/L (ref 3.5–5.1)
SODIUM: 138 mmol/L (ref 135–145)

## 2015-10-18 LAB — CBC
HEMATOCRIT: 50.1 % (ref 39.0–52.0)
HEMOGLOBIN: 16.3 g/dL (ref 13.0–17.0)
MCH: 31.7 pg (ref 26.0–34.0)
MCHC: 32.5 g/dL (ref 30.0–36.0)
MCV: 97.3 fL (ref 78.0–100.0)
Platelets: 193 10*3/uL (ref 150–400)
RBC: 5.15 MIL/uL (ref 4.22–5.81)
RDW: 15.5 % (ref 11.5–15.5)
WBC: 9.4 10*3/uL (ref 4.0–10.5)

## 2015-10-18 LAB — URINALYSIS, ROUTINE W REFLEX MICROSCOPIC
Glucose, UA: NEGATIVE mg/dL
Hgb urine dipstick: NEGATIVE
Ketones, ur: NEGATIVE mg/dL
LEUKOCYTES UA: NEGATIVE
NITRITE: NEGATIVE
PH: 6 (ref 5.0–8.0)
Protein, ur: 30 mg/dL — AB
SPECIFIC GRAVITY, URINE: 1.03 (ref 1.005–1.030)

## 2015-10-18 LAB — TROPONIN I

## 2015-10-18 LAB — URINE MICROSCOPIC-ADD ON
RBC / HPF: NONE SEEN RBC/hpf (ref 0–5)
SQUAMOUS EPITHELIAL / LPF: NONE SEEN

## 2015-10-18 MED ORDER — IPRATROPIUM-ALBUTEROL 0.5-2.5 (3) MG/3ML IN SOLN
3.0000 mL | Freq: Once | RESPIRATORY_TRACT | Status: AC
  Administered 2015-10-18: 3 mL via RESPIRATORY_TRACT
  Filled 2015-10-18: qty 3

## 2015-10-18 MED ORDER — DEXTROSE 5 % IV SOLN
1.0000 g | Freq: Once | INTRAVENOUS | Status: AC
  Administered 2015-10-18: 1 g via INTRAVENOUS
  Filled 2015-10-18: qty 10

## 2015-10-18 MED ORDER — DEXTROSE 5 % IV SOLN
500.0000 mg | INTRAVENOUS | Status: DC
  Administered 2015-10-18: 500 mg via INTRAVENOUS
  Filled 2015-10-18: qty 500

## 2015-10-18 MED ORDER — SODIUM CHLORIDE 0.9 % IV BOLUS (SEPSIS)
1000.0000 mL | Freq: Once | INTRAVENOUS | Status: AC
  Administered 2015-10-18: 1000 mL via INTRAVENOUS

## 2015-10-18 MED ORDER — METHYLPREDNISOLONE SODIUM SUCC 125 MG IJ SOLR
125.0000 mg | Freq: Once | INTRAMUSCULAR | Status: AC
  Administered 2015-10-18: 125 mg via INTRAVENOUS
  Filled 2015-10-18: qty 2

## 2015-10-18 MED ORDER — LEVOFLOXACIN 500 MG PO TABS: 500.0000 mg | ORAL_TABLET | Freq: Every day | ORAL | Status: DC

## 2015-10-18 MED ORDER — ONDANSETRON HCL 4 MG/2ML IJ SOLN
4.0000 mg | Freq: Once | INTRAMUSCULAR | Status: AC
  Administered 2015-10-18: 4 mg via INTRAVENOUS
  Filled 2015-10-18: qty 2

## 2015-10-18 MED ORDER — IOHEXOL 300 MG/ML  SOLN
75.0000 mL | Freq: Once | INTRAMUSCULAR | Status: AC | PRN
  Administered 2015-10-18: 75 mL via INTRAVENOUS

## 2015-10-18 MED ORDER — ALBUTEROL SULFATE HFA 108 (90 BASE) MCG/ACT IN AERS: 1.0000 | INHALATION_SPRAY | Freq: Four times a day (QID) | RESPIRATORY_TRACT | Status: DC | PRN

## 2015-10-18 NOTE — ED Notes (Signed)
Dr. Lacinda Axon aware of 02 sat on room air.  INstructed pt to return if gets any worse.  Pt verbalized understanding.

## 2015-10-18 NOTE — Discharge Instructions (Signed)
CT scan showed no further lung mass.  Out of work the rest the week. Rest. Fluids. Tylenol. Prescription for antibiotic and inhaler

## 2015-10-18 NOTE — ED Notes (Signed)
Pt dx with flu yesterday. Felt light headed this morning and felt short of breath, got up to tell wife and woke up on the floor. Pt alert now.

## 2015-10-18 NOTE — ED Provider Notes (Signed)
CSN: [id number]     Arrival date & time 10/18/15  [id number] History  By signing my name below, I, Dora Sims, attest that this documentation has been prepared under the direction and in the presence of physician practitioner, Nat Christen, MD. Electronically Signed: Dora Sims, Scribe. 10/18/2015. 8:45 AM.    Chief Complaint  Patient presents with  . Loss of Consciousness    The history is provided by the patient and the spouse. No language interpreter was used.     HPI Comments: THORSEN SPIKER is a 50 y.o. male brought in by his wife with h/o COPD, CAD, and MI who presents to the Emergency Department complaining of LOC occurring earlier this morning. Pt was diagnosed with the flu yesterday. He notes that he woke up this morning and experienced SOB and cough; he told his wife that he could not breathe and does not remember anything else until waking up on the floor. Pt has one coronary stent. He denies numbness or any other associated symptoms.  Cardiologist: Dr. Stanford Breed  Past Medical History  Diagnosis Date  . Hyperlipidemia   . Coronary artery disease   . Obesity   . COPD (chronic obstructive pulmonary disease) ([place])   . Adenomatous polyps   . Diverticulosis   . Hemorrhoids    Past Surgical History  Procedure Laterality Date  . Anterior cruciate ligament repair      S/P  . Coronary stent placement  2011    2 stents   Family History  Problem Relation Age of Onset  . Heart attack Mother     Infarction  . Arrhythmia Father     Atrial fibrillation   Social History  Substance Use Topics  . Smoking status: Current Every Day Smoker -- 1.00 packs/day for 30 years  . Smokeless tobacco: None  . Alcohol Use: 0.5 oz/week    1 Standard drinks or equivalent per week    Review of Systems  A complete 10 system review of systems was obtained and all systems are negative except as noted in the HPI and PMH.   Allergies  Other  Home Medications   Prior to Admission  medications   Medication Sig Start Date End Date Taking? Authorizing Provider  aspirin 81 MG EC tablet Take 81 mg by mouth daily.     Yes Historical Provider, MD  budesonide-formoterol (SYMBICORT) 160-4.5 MCG/ACT inhaler Inhale 2 puffs into the lungs 2 (two) times daily. 04/12/15  Yes Fransisca Kaufmann Dettinger, MD  EPINEPHrine 0.3 mg/0.3 mL IJ SOAJ injection Inject 0.3 mg into the muscle once.   Yes Historical Provider, MD  HYDROcodone-acetaminophen (NORCO) 5-325 MG per tablet Take 1 tablet by mouth every 6 (six) hours as needed for moderate pain. 04/12/15  Yes Fransisca Kaufmann Dettinger, MD  HYDROcodone-homatropine [facility]) 5-1.5 MG/5ML syrup Take 5 mLs by mouth every 8 (eight) hours as needed for cough. 10/17/15  Yes Sharion Balloon, FNP  metoprolol tartrate (LOPRESSOR) 25 MG tablet Take 1 tablet (25 mg total) by mouth 2 (two) times daily. 08/04/15  Yes Luke K Kilroy, PA-C  nitroGLYCERIN (NITROSTAT) 0.4 MG SL tablet Place 1 tablet (0.4 mg total) under the tongue every 5 (five) minutes as needed for chest pain. May repeat up to 3 doses. 08/04/15  Yes Erlene Quan, PA-C  oseltamivir (TAMIFLU) 75 MG capsule Take 1 capsule (75 mg total) by mouth 2 (two) times daily. 10/17/15  Yes Sharion Balloon, FNP  albuterol (PROVENTIL HFA;VENTOLIN HFA) 108 (90 Base) MCG/ACT inhaler  Inhale 1-2 puffs into the lungs every 6 (six) hours as needed for wheezing or shortness of breath. 10/18/15   Nat Christen, MD  levofloxacin (LEVAQUIN) 500 MG tablet Take 1 tablet (500 mg total) by mouth daily. 10/18/15   Nat Christen, MD   BP 125/68 mmHg  Pulse 93  Temp(Src) 101.7 F (38.7 C) (Temporal)  Resp 19  Ht 6\' 3"  (1.905 m)  Wt 270 lb (122.471 kg)  BMI 33.75 kg/m2  SpO2 95% Physical Exam  Constitutional: He is oriented to person, place, and time. He appears well-developed and well-nourished.  Fatigued but alert Appears slightly dehydrated  HENT:  Head: Normocephalic and atraumatic.  Eyes: Conjunctivae and EOM are normal. Pupils are equal,  round, and reactive to light.  Neck: Normal range of motion. Neck supple.  Cardiovascular: Normal rate and regular rhythm.   Pulmonary/Chest: Effort normal. He has rhonchi.  Rhonchorous breath sounds  Abdominal: Soft. Bowel sounds are normal.  Musculoskeletal: Normal range of motion.  Neurological: He is alert and oriented to person, place, and time.  Skin: Skin is warm and dry.  Psychiatric: He has a normal mood and affect. His behavior is normal.  Nursing note and vitals reviewed.   ED Course  Procedures (including critical care time)  DIAGNOSTIC STUDIES: Oxygen Saturation is 99% on [place], normal by my interpretation.    COORDINATION OF CARE: 8:45 AM Discussed treatment plan with pt at bedside and pt agreed to plan.  Results for orders placed or performed during the hospital encounter of [id number]  Basic metabolic panel  Result Value Ref Range   Sodium 138 135 - 145 mmol/L   Potassium 4.0 3.5 - 5.1 mmol/L   Chloride 103 101 - 111 mmol/L   CO2 26 22 - 32 mmol/L   Glucose, Bld 113 (H) 65 - 99 mg/dL   BUN 8 6 - 20 mg/dL   Creatinine, Ser 0.92 0.61 - 1.24 mg/dL   Calcium 8.6 (L) 8.9 - 10.3 mg/dL   GFR calc non Af Amer >60 >60 mL/min   GFR calc Af Amer >60 >60 mL/min   Anion gap 9 5 - 15  CBC  Result Value Ref Range   WBC 9.4 4.0 - 10.5 K/uL   RBC 5.15 4.22 - 5.81 MIL/uL   Hemoglobin 16.3 13.0 - 17.0 g/dL   HCT 50.1 39.0 - 52.0 %   MCV 97.3 78.0 - 100.0 fL   MCH 31.7 26.0 - 34.0 pg   MCHC 32.5 30.0 - 36.0 g/dL   RDW 15.5 11.5 - 15.5 %   Platelets 193 150 - 400 K/uL  Urinalysis, Routine w reflex microscopic (not at [facility])  Result Value Ref Range   Color, Urine YELLOW YELLOW   APPearance CLEAR CLEAR   Specific Gravity, Urine 1.030 1.005 - 1.030   pH 6.0 5.0 - 8.0   Glucose, UA NEGATIVE NEGATIVE mg/dL   Hgb urine dipstick NEGATIVE NEGATIVE   Bilirubin Urine SMALL (A) NEGATIVE   Ketones, ur NEGATIVE NEGATIVE mg/dL   Protein, ur 30 (A) NEGATIVE mg/dL   Nitrite NEGATIVE  NEGATIVE   Leukocytes, UA NEGATIVE NEGATIVE  Troponin I  Result Value Ref Range   Troponin I <0.03 <0.031 ng/mL  Urine microscopic-add on  Result Value Ref Range   Squamous Epithelial / LPF NONE SEEN NONE SEEN   WBC, UA 0-5 0 - 5 WBC/hpf   RBC / HPF NONE SEEN 0 - 5 RBC/hpf   Bacteria, UA FEW (A) NONE SEEN   Dg  Chest 2 View  10/18/2015  CLINICAL DATA:  Cough. History of COPD and coronary artery disease. Recent diagnosis of flu. Complains of shortness of breath. EXAM: CHEST  2 VIEW COMPARISON:  12/09/2013.  CT 06/15/2010 FINDINGS: Lucency in the upper lungs suggest underlying emphysema. There is concern for a 1.3 cm nodule along the periphery of the left upper lung. Few patchy densities in the lower lungs could represent atelectasis. Subtle airspace disease in left lung cannot be completely excluded. Heart and mediastinum are within normal limits. The trachea is midline. No pleural effusions. Degenerative endplate changes in the upper thoracic spine. IMPRESSION: Concern for a 1.3 cm nodule in the periphery of the left upper lung. Based on the history of COPD and evidence for emphysema, recommend further characterization with a chest CT to evaluate for a pulmonary nodule. Emphysematous changes with patchy densities in the lower lungs. Findings probably represent atelectasis but subtle infection cannot be excluded, these areas could also be better characterized with CT. Electronically Signed   By: Markus Daft M.D.   On: 10/18/2015 10:20   Ct Chest W Contrast  10/18/2015  CLINICAL DATA:  50 year old male with abnormal chest x-ray today. Cough and shortness of breath. Initial encounter. EXAM: CT CHEST WITH CONTRAST TECHNIQUE: Multidetector CT imaging of the chest was performed during intravenous contrast administration. CONTRAST:  16mL OMNIPAQUE IOHEXOL 300 MG/ML  SOLN COMPARISON:  Chest radiographs 0952 hours today. Chest CTA 06/15/2010. FINDINGS: Chronic centrilobular emphysema. Curvilinear bilateral  lower lobe opacity most resembles a combination of scarring and atelectasis, although there are some air bronchograms with peribronchial thickening in the posterior medial basal segment of the right lower lobe. Stable mild scarring in the lingula since 2011. No finding to correspond to the earlier radiographic nodule. No lung nodule. Major airways are patent aside from some retained secretions in the trachea. No pleural effusion. No pericardial effusion. Calcified aortic and coronary artery atherosclerosis. No thoracic lymphadenopathy. Negative thoracic inlet. Chronic hepatic steatosis. Otherwise negative visualized liver, spleen, pancreas, adrenal glands, kidneys, and bowel in the upper abdomen. No acute osseous abnormality identified. IMPRESSION: 1. Negative for pulmonary nodule. Suspect artifact in the left upper lobe on the chest radiograph from earlier today. 2. Emphysema. Small volume retained secretions in the trachea and bilateral lower lobe opacity greater on the right suspicious for acute infectious exacerbation (bronchopneumonia) superimposed on chronic lung scarring. No pleural effusion.  No thoracic lymphadenopathy. 3. Calcified aortic and coronary artery atherosclerosis. Electronically Signed   By: Genevie Ann M.D.   On: 10/18/2015 12:45     Labs Review Labs Reviewed  BASIC METABOLIC PANEL - Abnormal; Notable for the following:    Glucose, Bld 113 (*)    Calcium 8.6 (*)    All other components within normal limits  URINALYSIS, ROUTINE W REFLEX MICROSCOPIC (NOT AT [facility]) - Abnormal; Notable for the following:    Bilirubin Urine SMALL (*)    Protein, ur 30 (*)    All other components within normal limits  URINE MICROSCOPIC-ADD ON - Abnormal; Notable for the following:    Bacteria, UA FEW (*)    All other components within normal limits  CBC  TROPONIN I    Imaging Review Dg Chest 2 View  10/18/2015  CLINICAL DATA:  Cough. History of COPD and coronary artery disease. Recent diagnosis of  flu. Complains of shortness of breath. EXAM: CHEST  2 VIEW COMPARISON:  12/09/2013.  CT 06/15/2010 FINDINGS: Lucency in the upper lungs suggest underlying emphysema. There is concern  for a 1.3 cm nodule along the periphery of the left upper lung. Few patchy densities in the lower lungs could represent atelectasis. Subtle airspace disease in left lung cannot be completely excluded. Heart and mediastinum are within normal limits. The trachea is midline. No pleural effusions. Degenerative endplate changes in the upper thoracic spine. IMPRESSION: Concern for a 1.3 cm nodule in the periphery of the left upper lung. Based on the history of COPD and evidence for emphysema, recommend further characterization with a chest CT to evaluate for a pulmonary nodule. Emphysematous changes with patchy densities in the lower lungs. Findings probably represent atelectasis but subtle infection cannot be excluded, these areas could also be better characterized with CT. Electronically Signed   By: Markus Daft M.D.   On: 10/18/2015 10:20   Ct Chest W Contrast  10/18/2015  CLINICAL DATA:  50 year old male with abnormal chest x-ray today. Cough and shortness of breath. Initial encounter. EXAM: CT CHEST WITH CONTRAST TECHNIQUE: Multidetector CT imaging of the chest was performed during intravenous contrast administration. CONTRAST:  71mL OMNIPAQUE IOHEXOL 300 MG/ML  SOLN COMPARISON:  Chest radiographs 0952 hours today. Chest CTA 06/15/2010. FINDINGS: Chronic centrilobular emphysema. Curvilinear bilateral lower lobe opacity most resembles a combination of scarring and atelectasis, although there are some air bronchograms with peribronchial thickening in the posterior medial basal segment of the right lower lobe. Stable mild scarring in the lingula since 2011. No finding to correspond to the earlier radiographic nodule. No lung nodule. Major airways are patent aside from some retained secretions in the trachea. No pleural effusion. No  pericardial effusion. Calcified aortic and coronary artery atherosclerosis. No thoracic lymphadenopathy. Negative thoracic inlet. Chronic hepatic steatosis. Otherwise negative visualized liver, spleen, pancreas, adrenal glands, kidneys, and bowel in the upper abdomen. No acute osseous abnormality identified. IMPRESSION: 1. Negative for pulmonary nodule. Suspect artifact in the left upper lobe on the chest radiograph from earlier today. 2. Emphysema. Small volume retained secretions in the trachea and bilateral lower lobe opacity greater on the right suspicious for acute infectious exacerbation (bronchopneumonia) superimposed on chronic lung scarring. No pleural effusion.  No thoracic lymphadenopathy. 3. Calcified aortic and coronary artery atherosclerosis. Electronically Signed   By: Genevie Ann M.D.   On: 10/18/2015 12:45   I have personally reviewed and evaluated these images and lab results as part of my medical decision-making.   EKG Interpretation   Date/Time:  Wednesday October 18 2015 08:32:16 EDT Ventricular Rate:  89 PR Interval:  166 QRS Duration: 84 QT Interval:  360 QTC Calculation: 438 R Axis:   91 Text Interpretation:  Sinus rhythm Borderline right axis deviation  Borderline low voltage, extremity leads Nonspecific T abnormalities,  lateral leads Baseline wander in lead(s) I V1 V2 Confirmed by Breckyn Ticas  MD,  Zaliah Wissner ([id number]) on 10/18/2015 10:54:18 AM     CRITICAL CARE Performed by: Nat Christen Total critical care time: 30- minutes Critical care time was exclusive of separately billable procedures and treating other patients. Critical care was necessary to treat or prevent imminent or life-threatening deterioration. Critical care was time spent personally by me on the following activities: development of treatment plan with patient and/or surrogate as well as nursing, discussions with consultants, evaluation of patient's response to treatment, examination of patient, obtaining history from  patient or surrogate, ordering and performing treatments and interventions, ordering and review of laboratory studies, ordering and review of radiographic studies, pulse oximetry and re-evaluation of patient's condition. MDM   Final diagnoses:  Viral syndrome  Patient feels better after nebulizer treatment and IV fluids. CT chest negative for pulmonary mass but suspect bilateral lower lobe opacities consistent with pneumonia. IV steroid, IV Zithromax, IV Rocephin. Discharge medications Levaquin 500 mg and albuterol inhaler.  I personally performed the services described in this documentation, which was scribed in my presence. The recorded information has been reviewed and is accurate.      Nat Christen, MD 10/18/15 1346

## 2015-10-20 ENCOUNTER — Inpatient Hospital Stay (HOSPITAL_COMMUNITY)
Admission: EM | Admit: 2015-10-20 | Discharge: 2015-10-22 | DRG: 190 | Disposition: A | Discharge status: Home / Self Care | Payer: BLUE CROSS/BLUE SHIELD | Attending: Internal Medicine | Admitting: Internal Medicine

## 2015-10-20 ENCOUNTER — Encounter (HOSPITAL_COMMUNITY): Payer: Self-pay | Admitting: Emergency Medicine

## 2015-10-20 ENCOUNTER — Emergency Department (HOSPITAL_COMMUNITY): Payer: BLUE CROSS/BLUE SHIELD

## 2015-10-20 DIAGNOSIS — Z6833 Body mass index (BMI) 33.0-33.9, adult: Secondary | ICD-10-CM

## 2015-10-20 DIAGNOSIS — T447X5A Adverse effect of beta-adrenoreceptor antagonists, initial encounter: Secondary | ICD-10-CM | POA: Diagnosis not present

## 2015-10-20 DIAGNOSIS — Z8249 Family history of ischemic heart disease and other diseases of the circulatory system: Secondary | ICD-10-CM

## 2015-10-20 DIAGNOSIS — Z955 Presence of coronary angioplasty implant and graft: Secondary | ICD-10-CM

## 2015-10-20 DIAGNOSIS — E785 Hyperlipidemia, unspecified: Secondary | ICD-10-CM | POA: Diagnosis present

## 2015-10-20 DIAGNOSIS — Z7982 Long term (current) use of aspirin: Secondary | ICD-10-CM | POA: Diagnosis not present

## 2015-10-20 DIAGNOSIS — J1100 Influenza due to unidentified influenza virus with unspecified type of pneumonia: Secondary | ICD-10-CM | POA: Diagnosis not present

## 2015-10-20 DIAGNOSIS — J111 Influenza due to unidentified influenza virus with other respiratory manifestations: Secondary | ICD-10-CM

## 2015-10-20 DIAGNOSIS — Z7951 Long term (current) use of inhaled steroids: Secondary | ICD-10-CM | POA: Diagnosis not present

## 2015-10-20 DIAGNOSIS — J441 Chronic obstructive pulmonary disease with (acute) exacerbation: Secondary | ICD-10-CM | POA: Diagnosis not present

## 2015-10-20 DIAGNOSIS — J9601 Acute respiratory failure with hypoxia: Secondary | ICD-10-CM | POA: Diagnosis not present

## 2015-10-20 DIAGNOSIS — I2510 Atherosclerotic heart disease of native coronary artery without angina pectoris: Secondary | ICD-10-CM | POA: Diagnosis not present

## 2015-10-20 DIAGNOSIS — R001 Bradycardia, unspecified: Secondary | ICD-10-CM | POA: Diagnosis not present

## 2015-10-20 DIAGNOSIS — E669 Obesity, unspecified: Secondary | ICD-10-CM | POA: Diagnosis present

## 2015-10-20 DIAGNOSIS — J101 Influenza due to other identified influenza virus with other respiratory manifestations: Secondary | ICD-10-CM | POA: Diagnosis present

## 2015-10-20 DIAGNOSIS — Z9861 Coronary angioplasty status: Secondary | ICD-10-CM

## 2015-10-20 DIAGNOSIS — F17210 Nicotine dependence, cigarettes, uncomplicated: Secondary | ICD-10-CM | POA: Diagnosis present

## 2015-10-20 DIAGNOSIS — J9691 Respiratory failure, unspecified with hypoxia: Secondary | ICD-10-CM | POA: Insufficient documentation

## 2015-10-20 DIAGNOSIS — J189 Pneumonia, unspecified organism: Secondary | ICD-10-CM | POA: Diagnosis not present

## 2015-10-20 DIAGNOSIS — R0602 Shortness of breath: Secondary | ICD-10-CM | POA: Diagnosis not present

## 2015-10-20 DIAGNOSIS — J440 Chronic obstructive pulmonary disease with acute lower respiratory infection: Secondary | ICD-10-CM | POA: Diagnosis not present

## 2015-10-20 DIAGNOSIS — J9692 Respiratory failure, unspecified with hypercapnia: Secondary | ICD-10-CM

## 2015-10-20 DIAGNOSIS — J09X1 Influenza due to identified novel influenza A virus with pneumonia: Secondary | ICD-10-CM | POA: Diagnosis present

## 2015-10-20 DIAGNOSIS — Z720 Tobacco use: Secondary | ICD-10-CM | POA: Diagnosis present

## 2015-10-20 HISTORY — DX: Influenza due to other identified influenza virus with other respiratory manifestations: J10.1

## 2015-10-20 LAB — CBC WITH DIFFERENTIAL/PLATELET
BASOS ABS: 0 10*3/uL (ref 0.0–0.1)
Basophils Relative: 0 %
Eosinophils Absolute: 0 10*3/uL (ref 0.0–0.7)
Eosinophils Relative: 0 %
HEMATOCRIT: 47.6 % (ref 39.0–52.0)
HEMOGLOBIN: 15.5 g/dL (ref 13.0–17.0)
LYMPHS PCT: 18 %
Lymphs Abs: 1 10*3/uL (ref 0.7–4.0)
MCH: 30.9 pg (ref 26.0–34.0)
MCHC: 32.6 g/dL (ref 30.0–36.0)
MCV: 95 fL (ref 78.0–100.0)
MONO ABS: 0.8 10*3/uL (ref 0.1–1.0)
Monocytes Relative: 15 %
NEUTROS ABS: 3.6 10*3/uL (ref 1.7–7.7)
NEUTROS PCT: 67 %
Platelets: 189 10*3/uL (ref 150–400)
RBC: 5.01 MIL/uL (ref 4.22–5.81)
RDW: 15.4 % (ref 11.5–15.5)
WBC: 5.3 10*3/uL (ref 4.0–10.5)

## 2015-10-20 LAB — D-DIMER, QUANTITATIVE (NOT AT ARMC): D DIMER QUANT: 0.47 ug{FEU}/mL (ref 0.00–0.50)

## 2015-10-20 LAB — BASIC METABOLIC PANEL
ANION GAP: 8 (ref 5–15)
BUN: 9 mg/dL (ref 6–20)
CHLORIDE: 105 mmol/L (ref 101–111)
CO2: 24 mmol/L (ref 22–32)
Calcium: 8.1 mg/dL — ABNORMAL LOW (ref 8.9–10.3)
Creatinine, Ser: 0.73 mg/dL (ref 0.61–1.24)
GFR calc non Af Amer: >60 mL/min (ref >60)
GLUCOSE: 102 mg/dL — AB (ref 65–99)
POTASSIUM: 3.5 mmol/L (ref 3.5–5.1)
Sodium: 137 mmol/L (ref 135–145)

## 2015-10-20 LAB — BRAIN NATRIURETIC PEPTIDE: B NATRIURETIC PEPTIDE 5: 99 pg/mL (ref 0.0–100.0)

## 2015-10-20 LAB — TROPONIN I: Troponin I: <0.03 ng/mL (ref <0.031)

## 2015-10-20 MED ORDER — SODIUM CHLORIDE 0.9 % IV SOLN: INTRAVENOUS | Status: DC

## 2015-10-20 MED ORDER — POTASSIUM CHLORIDE IN NACL 20-0.9 MEQ/L-% IV SOLN
INTRAVENOUS | Status: DC
  Administered 2015-10-20 – 2015-10-21 (×2): via INTRAVENOUS
  Filled 2015-10-20: qty 1000

## 2015-10-20 MED ORDER — PREDNISONE 50 MG PO TABS
60.0000 mg | ORAL_TABLET | Freq: Once | ORAL | Status: AC
  Administered 2015-10-20: 60 mg via ORAL
  Filled 2015-10-20: qty 1

## 2015-10-20 MED ORDER — OXYCODONE HCL 5 MG PO TABS: 5.0000 mg | ORAL_TABLET | ORAL | Status: DC | PRN

## 2015-10-20 MED ORDER — ALBUTEROL SULFATE (2.5 MG/3ML) 0.083% IN NEBU: 2.5000 mg | INHALATION_SOLUTION | RESPIRATORY_TRACT | Status: DC | PRN

## 2015-10-20 MED ORDER — OSELTAMIVIR PHOSPHATE 75 MG PO CAPS
75.0000 mg | ORAL_CAPSULE | Freq: Two times a day (BID) | ORAL | Status: DC
  Administered 2015-10-20 – 2015-10-22 (×5): 75 mg via ORAL
  Filled 2015-10-20 (×5): qty 1

## 2015-10-20 MED ORDER — ACETAMINOPHEN 650 MG RE SUPP
650.0000 mg | Freq: Four times a day (QID) | RECTAL | Status: DC | PRN
Start: 2015-10-20 — End: 2015-10-22

## 2015-10-20 MED ORDER — ENOXAPARIN SODIUM 40 MG/0.4ML ~~LOC~~ SOLN
40.0000 mg | SUBCUTANEOUS | Status: DC
  Administered 2015-10-20 – 2015-10-21 (×2): 40 mg via SUBCUTANEOUS
  Filled 2015-10-20 (×2): qty 0.4

## 2015-10-20 MED ORDER — NICOTINE 14 MG/24HR TD PT24
14.0000 mg | MEDICATED_PATCH | Freq: Every day | TRANSDERMAL | Status: DC
  Filled 2015-10-20 (×2): qty 1

## 2015-10-20 MED ORDER — POTASSIUM CHLORIDE CRYS ER 20 MEQ PO TBCR
20.0000 meq | EXTENDED_RELEASE_TABLET | Freq: Every day | ORAL | Status: DC
  Administered 2015-10-20 – 2015-10-22 (×3): 20 meq via ORAL
  Filled 2015-10-20 (×3): qty 1

## 2015-10-20 MED ORDER — LEVOFLOXACIN IN D5W 750 MG/150ML IV SOLN
750.0000 mg | INTRAVENOUS | Status: DC
  Administered 2015-10-21 – 2015-10-22 (×2): 750 mg via INTRAVENOUS
  Filled 2015-10-20 (×3): qty 150

## 2015-10-20 MED ORDER — ONDANSETRON HCL 4 MG/2ML IJ SOLN: 4.0000 mg | Freq: Four times a day (QID) | INTRAMUSCULAR | Status: DC | PRN

## 2015-10-20 MED ORDER — IPRATROPIUM-ALBUTEROL 0.5-2.5 (3) MG/3ML IN SOLN
3.0000 mL | Freq: Once | RESPIRATORY_TRACT | Status: AC
  Administered 2015-10-20: 3 mL via RESPIRATORY_TRACT
  Filled 2015-10-20: qty 3

## 2015-10-20 MED ORDER — METOPROLOL TARTRATE 25 MG PO TABS
25.0000 mg | ORAL_TABLET | Freq: Two times a day (BID) | ORAL | Status: DC
  Administered 2015-10-20 – 2015-10-22 (×5): 25 mg via ORAL
  Filled 2015-10-20 (×5): qty 1

## 2015-10-20 MED ORDER — DOCUSATE SODIUM 100 MG PO CAPS
100.0000 mg | ORAL_CAPSULE | Freq: Two times a day (BID) | ORAL | Status: DC
  Administered 2015-10-20 – 2015-10-22 (×5): 100 mg via ORAL
  Filled 2015-10-20 (×11): qty 1

## 2015-10-20 MED ORDER — ONDANSETRON HCL 4 MG PO TABS
4.0000 mg | ORAL_TABLET | Freq: Four times a day (QID) | ORAL | Status: DC | PRN
Start: 2015-10-20 — End: 2015-10-22

## 2015-10-20 MED ORDER — ACETAMINOPHEN 325 MG PO TABS: 650.0000 mg | ORAL_TABLET | Freq: Four times a day (QID) | ORAL | Status: DC | PRN

## 2015-10-20 MED ORDER — LEVOFLOXACIN IN D5W 500 MG/100ML IV SOLN
500.0000 mg | Freq: Once | INTRAVENOUS | Status: AC
  Administered 2015-10-20: 500 mg via INTRAVENOUS
  Filled 2015-10-20: qty 100

## 2015-10-20 MED ORDER — NITROGLYCERIN 0.4 MG SL SUBL: 0.4000 mg | SUBLINGUAL_TABLET | SUBLINGUAL | Status: DC | PRN

## 2015-10-20 MED ORDER — BENZONATATE 100 MG PO CAPS
100.0000 mg | ORAL_CAPSULE | Freq: Three times a day (TID) | ORAL | Status: DC
  Administered 2015-10-20 – 2015-10-22 (×6): 100 mg via ORAL
  Filled 2015-10-20 (×6): qty 1

## 2015-10-20 MED ORDER — IPRATROPIUM-ALBUTEROL 0.5-2.5 (3) MG/3ML IN SOLN
3.0000 mL | Freq: Four times a day (QID) | RESPIRATORY_TRACT | Status: DC
  Administered 2015-10-20 – 2015-10-22 (×8): 3 mL via RESPIRATORY_TRACT
  Filled 2015-10-20 (×8): qty 3

## 2015-10-20 MED ORDER — ASPIRIN 81 MG PO CHEW
81.0000 mg | CHEWABLE_TABLET | Freq: Every day | ORAL | Status: DC
  Administered 2015-10-20 – 2015-10-22 (×3): 81 mg via ORAL
  Filled 2015-10-20 (×3): qty 1

## 2015-10-20 MED ORDER — HYDROCOD POLST-CPM POLST ER 10-8 MG/5ML PO SUER
5.0000 mL | Freq: Two times a day (BID) | ORAL | Status: DC
  Administered 2015-10-20 – 2015-10-22 (×5): 5 mL via ORAL
  Filled 2015-10-20 (×5): qty 5

## 2015-10-20 MED ORDER — MOMETASONE FURO-FORMOTEROL FUM 200-5 MCG/ACT IN AERO
2.0000 | INHALATION_SPRAY | Freq: Two times a day (BID) | RESPIRATORY_TRACT | Status: DC
  Administered 2015-10-21 – 2015-10-22 (×3): 2 via RESPIRATORY_TRACT
  Filled 2015-10-20: qty 8.8

## 2015-10-20 NOTE — ED Notes (Signed)
Pt states he is breathing better right now.  sats 96 on 1 liter oxygen [place]

## 2015-10-20 NOTE — H&P (Signed)
[facility] History and Physical  JARREN MITCHUM [id number] DOB: September 24, 1965 DOA: 10/20/2015  Referring physician: ED MD, Dr. Wyvonnia Dusky PCP: [name], FNP   Chief Complaint: Shortness of breath  HPI: SABAN PEGG is a 50 y.o. male with a history of CAD, COPD, obesity, and tobacco abuse, who presents to the emergency department with a chief complaint of shortness of breath. Patient was diagnosed by his PCP with influenza A on 10/17/15. He was started on Tamiflu then. He presented to the ED on 10/18/15 because he was not getting any better. He was diagnosed with pneumonia at that time. He was treated with Levaquin and given a prescription for Levaquin. He did not feel any better. He tried to smoke, but could not due to his shortness of breath. He has had some nonproductive coughing, but no pleurisy and no chest pain. He had subjective fever and chills a couple days ago, but they have subsided. He continues to have some body aches. He denies nausea or vomiting, but had several loose stools yesterday. He denies black tarry stools or bright red blood per rectum. He denies swelling in his legs. He denies pain with urination.  In the ED, he is afebrile and hemodynamically stable, but his blood pressures on the low-normal side. His initial oxygen saturation with ambulation on room air was 85%, but improved to 96% with nasal cannula oxygen. His chest x-ray reveals no active disease. His d-dimer is within normal limits. His BNP is within normal limits. Troponin I is negative. His EKG reveals sinus rhythm with a heart rate of 75 bpm and no concerning ST or T-wave abnormalities. He is being admitted for further evaluation and management.   Review of Systems:  As above in history present illness, otherwise negative.  Past Medical History  Diagnosis Date  . Hyperlipidemia   . Coronary artery disease   . Obesity   . COPD (chronic obstructive pulmonary disease) ([place])   . Adenomatous polyps     . Diverticulosis   . Hemorrhoids   . Influenza A 10/20/2015   Past Surgical History  Procedure Laterality Date  . Anterior cruciate ligament repair      S/P  . Coronary stent placement  2011    2 stents   Social History:He is married. He has no children. He is employed. He smokes one pack of cigarettes per day. He  reports that he has been smoking.  He does not have any smokeless tobacco history on file. He reports that he drinks about 0.5 oz of alcohol per week. He reports that he does not use illicit drugs.  Allergies  Allergen Reactions  . Other Swelling    Mammelain meat allergy    Family History  Problem Relation Age of Onset  . Heart attack Mother     Infarction  . Arrhythmia Father     Atrial fibrillation    Prior to Admission medications   Medication Sig Start Date End Date Taking? Authorizing Provider  albuterol (PROVENTIL HFA;VENTOLIN HFA) 108 (90 Base) MCG/ACT inhaler Inhale 1-2 puffs into the lungs every 6 (six) hours as needed for wheezing or shortness of breath. 10/18/15  Yes Nat Christen, MD  aspirin 81 MG EC tablet Take 81 mg by mouth daily.     Yes Historical Provider, MD  budesonide-formoterol (SYMBICORT) 160-4.5 MCG/ACT inhaler Inhale 2 puffs into the lungs 2 (two) times daily. 04/12/15  Yes Fransisca Kaufmann Dettinger, MD  EPINEPHrine 0.3 mg/0.3 mL IJ SOAJ injection Inject 0.3 mg  into the muscle once.   Yes Historical Provider, MD  HYDROcodone-acetaminophen (NORCO) 5-325 MG per tablet Take 1 tablet by mouth every 6 (six) hours as needed for moderate pain. 04/12/15  Yes Fransisca Kaufmann Dettinger, MD  HYDROcodone-homatropine [facility]) 5-1.5 MG/5ML syrup Take 5 mLs by mouth every 8 (eight) hours as needed for cough. 10/17/15  Yes Sharion Balloon, FNP  levofloxacin (LEVAQUIN) 500 MG tablet Take 1 tablet (500 mg total) by mouth daily. 10/18/15  Yes Nat Christen, MD  metoprolol tartrate (LOPRESSOR) 25 MG tablet Take 1 tablet (25 mg total) by mouth 2 (two) times daily. 08/04/15  Yes Luke K  Kilroy, PA-C  nitroGLYCERIN (NITROSTAT) 0.4 MG SL tablet Place 1 tablet (0.4 mg total) under the tongue every 5 (five) minutes as needed for chest pain. May repeat up to 3 doses. 08/04/15  Yes Erlene Quan, PA-C  oseltamivir (TAMIFLU) 75 MG capsule Take 1 capsule (75 mg total) by mouth 2 (two) times daily. 10/17/15  Yes Sharion Balloon, FNP   Physical Exam: Filed Vitals:   10/20/15 0900 10/20/15 0959 10/20/15 1000 10/20/15 1100  BP: 109/58  127/113 109/65  Pulse: 77  74 87  Temp:      TempSrc:      Resp: 22   22  Height:      Weight:      SpO2: 98% 98% 98% 96%    Wt Readings from Last 3 Encounters:  10/20/15 122.471 kg (270 lb)  10/18/15 122.471 kg (270 lb)  10/17/15 122.471 kg (270 lb)    General:  Obese 49 year old Caucasian man who appears to be ill, but in no acute distress. Eyes: PERRL, normal lids, irises & conjunctiva; conjunctivae are clear and sclerae are white. ENT: grossly normal hearing, oropharynx with moist because membranes. Neck: no LAD, masses or thyromegaly; no adenopathy. Cardiovascular: RRR, no m/r/g. No LE edema. Telemetry: SR, no arrhythmias  Respiratory: Occasional crackles and wheezes in the mid lobes bilaterally; breathing nonlabored at rest. Abdomen: soft, positive bowel sounds, soft, nontender, nondistended. Skin: no rash or induration seen on limited exam Musculoskeletal: grossly normal tone BUE/BLE; no acute hot red joints. Psychiatric: grossly normal mood and affect, speech fluent and appropriate Neurologic: grossly non-focal; cranial nerves II through XII are grossly intact.           Labs on Admission:  Basic Metabolic Panel:  Recent Labs Lab 10/18/15 0839 10/20/15 0800  NA 138 137  K 4.0 3.5  CL 103 105  CO2 26 24  GLUCOSE 113* 102*  BUN 8 9  CREATININE 0.92 0.73  CALCIUM 8.6* 8.1*   Liver Function Tests: No results for input(s): AST, ALT, ALKPHOS, BILITOT, PROT, ALBUMIN in the last 168 hours. No results for input(s): LIPASE,  AMYLASE in the last 168 hours. No results for input(s): AMMONIA in the last 168 hours. CBC:  Recent Labs Lab 10/18/15 0839 10/20/15 0800  WBC 9.4 5.3  NEUTROABS  --  3.6  HGB 16.3 15.5  HCT 50.1 47.6  MCV 97.3 95.0  PLT 193 189   Cardiac Enzymes:  Recent Labs Lab 10/18/15 0813 10/20/15 0800  TROPONINI <0.03 <0.03    BNP (last 3 results)  Recent Labs  10/20/15 0800  BNP 99.0    ProBNP (last 3 results) No results for input(s): PROBNP in the last 8760 hours.  CBG: No results for input(s): GLUCAP in the last 168 hours.  Radiological Exams on Admission: Dg Chest 2 View  10/20/2015  CLINICAL DATA:  Shortness of Breath with worsening last 2 days EXAM: CHEST  2 VIEW COMPARISON:  10/18/2015 FINDINGS: Cardiomediastinal silhouette is unremarkable. No acute infiltrate or pleural effusion. No pulmonary edema. Mild hyperinflation. Bony thorax is unremarkable. IMPRESSION: Mild hyperinflation.  No active disease. Electronically Signed   By: Lahoma Crocker M.D.   On: 10/20/2015 08:53   Ct Chest W Contrast  10/18/2015  CLINICAL DATA:  50 year old male with abnormal chest x-ray today. Cough and shortness of breath. Initial encounter. EXAM: CT CHEST WITH CONTRAST TECHNIQUE: Multidetector CT imaging of the chest was performed during intravenous contrast administration. CONTRAST:  20mL OMNIPAQUE IOHEXOL 300 MG/ML  SOLN COMPARISON:  Chest radiographs 0952 hours today. Chest CTA 06/15/2010. FINDINGS: Chronic centrilobular emphysema. Curvilinear bilateral lower lobe opacity most resembles a combination of scarring and atelectasis, although there are some air bronchograms with peribronchial thickening in the posterior medial basal segment of the right lower lobe. Stable mild scarring in the lingula since 2011. No finding to correspond to the earlier radiographic nodule. No lung nodule. Major airways are patent aside from some retained secretions in the trachea. No pleural effusion. No pericardial  effusion. Calcified aortic and coronary artery atherosclerosis. No thoracic lymphadenopathy. Negative thoracic inlet. Chronic hepatic steatosis. Otherwise negative visualized liver, spleen, pancreas, adrenal glands, kidneys, and bowel in the upper abdomen. No acute osseous abnormality identified. IMPRESSION: 1. Negative for pulmonary nodule. Suspect artifact in the left upper lobe on the chest radiograph from earlier today. 2. Emphysema. Small volume retained secretions in the trachea and bilateral lower lobe opacity greater on the right suspicious for acute infectious exacerbation (bronchopneumonia) superimposed on chronic lung scarring. No pleural effusion.  No thoracic lymphadenopathy. 3. Calcified aortic and coronary artery atherosclerosis. Electronically Signed   By: Genevie Ann M.D.   On: 10/18/2015 12:45    EKG: Independently reviewed.   Assessment/Plan Principal Problem:   Influenza and pneumonia Active Problems:   Bilateral pneumonia   Acute respiratory failure with hypoxia (HCC)   Influenza A   COPD with exacerbation ([place])   OBESITY   CAD S/P percutaneous coronary angioplasty   Tobacco abuse   50 year old man who presents with influenza A, bilateral pneumonia, and COPD exacerbation causing acute respiratory failure with hypoxia. He did not improve with outpatient treatment.  Plan: -The patient will be admitted for further evaluation and management. -The patient received IV Solu-Medrol 2 days ago in the ED. He does not appear to have significant wheezing, so we'll hold off on further intravenous steroids for now. -We'll continue Tamiflu for 3 more days. We'll continue IV Levaquin. -Will start duo nebulizers every 6 hours with albuterol neb every 2 hours as needed. Continue steroid inhaler. -Apply oxygen to keep his oxygen saturations greater than 90%. -Start Tussionex every 12 hours and Tessalon Perles 3 times a day for cough. -Place nicotine patch. Patient was strongly advised to  stop smoking indefinitely. -Continue chronic medications for CAD including metoprolol and aspirin.     Code Status: Full code  DVT Prophylaxis:Lovenox  Family Communication: Discussed with his mother  Disposition Plan: Anticipate discharge in 48-72 hours.  Time spent: One hour  [facility] Pager [phone]

## 2015-10-20 NOTE — ED Notes (Signed)
Patient became very short of breath while ambulating and o2 sat dropped to 85%

## 2015-10-20 NOTE — ED Provider Notes (Signed)
Sure CSN: [id number]     Arrival date & time 10/20/15  0727 History   First MD Initiated Contact with Patient 10/20/15 [id number]     Chief Complaint  Patient presents with  . Shortness of Breath     (Consider location/radiation/quality/duration/timing/severity/associated sxs/prior Treatment) HPI Comments: Patient returns with worsening shortness of breath over the past 2 days. He was diagnosed with the flu on March 28. He was seen in the ED on March 29 with shortness of breath or cough and loss of consciousness. He was treated for pneumonia with Levaquin. He reports turned stating that he cannot breathe and feels worsening shortness of breath especially with exertion and trying to lay down. States compliance with the Tamiflu and Levaquin. He does have a history of COPD, CAD and hyperlipidemia. Still has intermittent fevers. Denies any nausea or vomiting. Denies any diarrhea. Has some abdominal pain from coughing but no chest pain. He smokes about one pack a day.  Patient is a 50 y.o. male presenting with shortness of breath. The history is provided by the patient and a relative. The history is limited by the condition of the patient.  Shortness of Breath Associated symptoms: cough and fever   Associated symptoms: no abdominal pain, no chest pain, no headaches, no rash, no sore throat and no vomiting     Past Medical History  Diagnosis Date  . Hyperlipidemia   . Coronary artery disease   . Obesity   . COPD (chronic obstructive pulmonary disease) ([place])   . Adenomatous polyps   . Diverticulosis   . Hemorrhoids   . Influenza A 10/20/2015   Past Surgical History  Procedure Laterality Date  . Anterior cruciate ligament repair      S/P  . Coronary stent placement  2011    2 stents   Family History  Problem Relation Age of Onset  . Heart attack Mother     Infarction  . Arrhythmia Father     Atrial fibrillation   Social History  Substance Use Topics  . Smoking status: Current Every Day  Smoker -- 1.00 packs/day for 30 years  . Smokeless tobacco: None  . Alcohol Use: 0.5 oz/week    1 Standard drinks or equivalent per week    Review of Systems  Constitutional: Positive for fever, chills, activity change, appetite change and fatigue.  HENT: Positive for congestion. Negative for sore throat.   Eyes: Negative for visual disturbance.  Respiratory: Positive for cough and shortness of breath. Negative for chest tightness.   Cardiovascular: Negative for chest pain.  Gastrointestinal: Negative for nausea, vomiting and abdominal pain.  Genitourinary: Negative for urgency.  Musculoskeletal: Positive for myalgias and arthralgias.  Skin: Negative for rash.  Neurological: Negative for dizziness, weakness and headaches.  A complete 10 system review of systems was obtained and all systems are negative except as noted in the HPI and PMH.      Allergies  Other  Home Medications   Prior to Admission medications   Medication Sig Start Date End Date Taking? Authorizing Provider  albuterol (PROVENTIL HFA;VENTOLIN HFA) 108 (90 Base) MCG/ACT inhaler Inhale 1-2 puffs into the lungs every 6 (six) hours as needed for wheezing or shortness of breath. 10/18/15  Yes Nat Christen, MD  aspirin 81 MG EC tablet Take 81 mg by mouth daily.     Yes Historical Provider, MD  budesonide-formoterol (SYMBICORT) 160-4.5 MCG/ACT inhaler Inhale 2 puffs into the lungs 2 (two) times daily. 04/12/15  Yes Worthy Rancher, MD  EPINEPHrine 0.3 mg/0.3 mL IJ SOAJ injection Inject 0.3 mg into the muscle once.   Yes Historical Provider, MD  HYDROcodone-acetaminophen (NORCO) 5-325 MG per tablet Take 1 tablet by mouth every 6 (six) hours as needed for moderate pain. 04/12/15  Yes Fransisca Kaufmann Dettinger, MD  HYDROcodone-homatropine [facility]) 5-1.5 MG/5ML syrup Take 5 mLs by mouth every 8 (eight) hours as needed for cough. 10/17/15  Yes Sharion Balloon, FNP  levofloxacin (LEVAQUIN) 500 MG tablet Take 1 tablet (500 mg total) by  mouth daily. 10/18/15  Yes Nat Christen, MD  metoprolol tartrate (LOPRESSOR) 25 MG tablet Take 1 tablet (25 mg total) by mouth 2 (two) times daily. 08/04/15  Yes Luke K Kilroy, PA-C  nitroGLYCERIN (NITROSTAT) 0.4 MG SL tablet Place 1 tablet (0.4 mg total) under the tongue every 5 (five) minutes as needed for chest pain. May repeat up to 3 doses. 08/04/15  Yes Erlene Quan, PA-C  oseltamivir (TAMIFLU) 75 MG capsule Take 1 capsule (75 mg total) by mouth 2 (two) times daily. 10/17/15  Yes Christy A Hawks, FNP   BP 131/76 mmHg  Pulse 77  Temp(Src) 98.8 F (37.1 C) (Oral)  Resp 19  Ht 6\' 3"  (1.905 m)  Wt 270 lb (122.471 kg)  BMI 33.75 kg/m2  SpO2 97% Physical Exam  Constitutional: He is oriented to person, place, and time. He appears well-developed and well-nourished. No distress.  Somewhat ill appearing  HENT:  Head: Normocephalic and atraumatic.  Mouth/Throat: Oropharynx is clear and moist. No oropharyngeal exudate.  Eyes: Conjunctivae and EOM are normal. Pupils are equal, round, and reactive to light.  Neck: Normal range of motion. Neck supple.  No meningismus.  Cardiovascular: Normal rate, regular rhythm, normal heart sounds and intact distal pulses.   No murmur heard. Pulmonary/Chest: Effort normal and breath sounds normal. No respiratory distress.  Decreased breath sounds at bases.  Abdominal: Soft. There is no tenderness. There is no rebound and no guarding.  Musculoskeletal: Normal range of motion. He exhibits no edema or tenderness.  Neurological: He is alert and oriented to person, place, and time. No cranial nerve deficit. He exhibits normal muscle tone. Coordination normal.  No ataxia on finger to nose bilaterally. No pronator drift. 5/5 strength throughout. CN 2-12 intact.Equal grip strength. Sensation intact.   Skin: Skin is warm.  Psychiatric: He has a normal mood and affect. His behavior is normal.  Nursing note and vitals reviewed.   ED Course  Procedures (including  critical care time) Labs Review Labs Reviewed  BASIC METABOLIC PANEL - Abnormal; Notable for the following:    Glucose, Bld 102 (*)    Calcium 8.1 (*)    All other components within normal limits  CBC WITH DIFFERENTIAL/PLATELET  TROPONIN I  BRAIN NATRIURETIC PEPTIDE  D-DIMER, QUANTITATIVE (NOT AT [facility])    Imaging Review Dg Chest 2 View  10/20/2015  CLINICAL DATA:  Shortness of Breath with worsening last 2 days EXAM: CHEST  2 VIEW COMPARISON:  10/18/2015 FINDINGS: Cardiomediastinal silhouette is unremarkable. No acute infiltrate or pleural effusion. No pulmonary edema. Mild hyperinflation. Bony thorax is unremarkable. IMPRESSION: Mild hyperinflation.  No active disease. Electronically Signed   By: Lahoma Crocker M.D.   On: 10/20/2015 08:53   I have personally reviewed and evaluated these images and lab results as part of my medical decision-making.   EKG Interpretation   Date/Time:  Friday October 20 2015 07:44:00 EDT Ventricular Rate:  75 PR Interval:  159 QRS Duration: 85 QT Interval:  379 QTC Calculation: 423 R Axis:   92 Text Interpretation:  Sinus rhythm Anteroseptal infarct, age indeterminate  No significant change was found Confirmed by Wyvonnia Dusky  MD, Talor [place]rosiers [phone])  on 10/20/2015 7:53:20 AM      MDM   Final diagnoses:  Acute respiratory failure with hypoxia (HCC)  COPD with exacerbation ([place])  Influenza   Recently diagnosed with pneumonia on the flu presenting with worsening shortness of breath. No chest pain. EKG unchanged. Patient had CT scan and x-ray during previous visit that showed emphysema and bilateral lower lobe opacities.   patient given nebulizer. Chest x-ray is stable without infiltrate.  CT scan from yesterday reviewed. Patient is ambulatory but becomes very short of breath and drops his oxygen saturation 85%. Minimal wheezing on exam.  D-dimer is negative. Suspect COPD exacerbation complicated by pneumonia and influenza. Given his new oxygen requirement  and hypoxia with worsening symptoms we'll admit for observation.  Patient failing outpatient treatment of pneumonia, influenza and COPD exacerbation. New oxygen requirement.  Admission d/w Dr. Caryn Section.     Ezequiel Essex, MD 10/20/15 1501

## 2015-10-20 NOTE — ED Notes (Signed)
Pt states he is having some trouble breathing. Pt on 2L at this time with oxygen sat at 96%. RT called to come early for breathing tx.

## 2015-10-20 NOTE — ED Notes (Signed)
Pt states that his right arm is itching. No redness noted. Levaquin was complete at this time, it was disconnected. NAD noted. Pt told to let nurse know if itching continued.

## 2015-10-20 NOTE — ED Notes (Signed)
PT c/o SOB on exertion and trying to lay down worsening x2 days with tx for viral infection with Levaquin and albuterol inhaler prescribed in the ED x2 days ago. PT denies any chest pain.

## 2015-10-21 LAB — CBC
HCT: 46.8 % (ref 39.0–52.0)
Hemoglobin: 15.2 g/dL (ref 13.0–17.0)
MCH: 31.1 pg (ref 26.0–34.0)
MCHC: 32.5 g/dL (ref 30.0–36.0)
MCV: 95.7 fL (ref 78.0–100.0)
Platelets: 189 10*3/uL (ref 150–400)
RBC: 4.89 MIL/uL (ref 4.22–5.81)
RDW: 15.3 % (ref 11.5–15.5)
WBC: 4.1 10*3/uL (ref 4.0–10.5)

## 2015-10-21 LAB — HEPATIC FUNCTION PANEL
ALBUMIN: 3.5 g/dL (ref 3.5–5.0)
ALT: 55 U/L (ref 17–63)
AST: 44 U/L — AB (ref 15–41)
Alkaline Phosphatase: 35 U/L — ABNORMAL LOW (ref 38–126)
BILIRUBIN DIRECT: 0.1 mg/dL (ref 0.1–0.5)
Indirect Bilirubin: 0.5 mg/dL (ref 0.3–0.9)
Total Bilirubin: 0.6 mg/dL (ref 0.3–1.2)
Total Protein: 6.4 g/dL — ABNORMAL LOW (ref 6.5–8.1)

## 2015-10-21 LAB — BASIC METABOLIC PANEL
Anion gap: 9 (ref 5–15)
BUN: 10 mg/dL (ref 6–20)
CALCIUM: 8.7 mg/dL — AB (ref 8.9–10.3)
CO2: 26 mmol/L (ref 22–32)
CREATININE: 0.79 mg/dL (ref 0.61–1.24)
Chloride: 107 mmol/L (ref 101–111)
GFR calc non Af Amer: >60 mL/min (ref >60)
Glucose, Bld: 96 mg/dL (ref 65–99)
Potassium: 4 mmol/L (ref 3.5–5.1)
SODIUM: 142 mmol/L (ref 135–145)

## 2015-10-21 LAB — TSH: TSH: 1.726 u[IU]/mL (ref 0.350–4.500)

## 2015-10-21 MED ORDER — PREDNISONE 20 MG PO TABS
40.0000 mg | ORAL_TABLET | Freq: Two times a day (BID) | ORAL | Status: DC
  Administered 2015-10-21 – 2015-10-22 (×2): 40 mg via ORAL
  Filled 2015-10-21 (×2): qty 2

## 2015-10-21 NOTE — Progress Notes (Signed)
[facility] PROGRESS NOTE  William Carson [id number] DOB: 1966-04-10 DOA: 10/20/2015 PCP: [name], FNP    Code Status: Full code Family Communication: Discussed with mother Disposition Plan: Discharge when clinically appropriate, possibly in the next 24-48 hours.   Consultants:  None  Procedures:  None  Antibiotics:  Levaquin 10/20/15>>  HPI/Subjective: Patient says he is breathing a little better. He still feels quite winded when ambulating to the afternoon. His cough is about the same.  Objective: Filed Vitals:   10/21/15 0506 10/21/15 1327  BP: 110/61 123/71  Pulse: 57 63  Temp: 97.8 F (36.6 C) 97.7 F (36.5 C)  Resp: 20 20  Oxygen saturation 97% on nasal cannula oxygen.  Intake/Output Summary (Last 24 hours) at 10/21/15 1339 Last data filed at 10/21/15 1328  Gross per 24 hour  Intake    240 ml  Output   1900 ml  Net  -1660 ml   Filed Weights   10/20/15 0733 10/20/15 1600 10/21/15 0506  Weight: 122.471 kg (270 lb) 118.933 kg (262 lb 3.2 oz) 119.84 kg (264 lb 3.2 oz)    Exam:   General:  Obese 50 year old Caucasian man in no acute distress. He looks a little better this morning.  Cardiovascular: S1, S2, with a soft systolic murmur.  Respiratory: Several scattered fine wheezes; breathing nonlabored at rest.  Abdomen: Obese, positive bowel sounds, soft, nontender, nondistended.  Musculoskeletal/extremities: No pedal edema.   Data Reviewed: Basic Metabolic Panel:  Recent Labs Lab 10/18/15 0839 10/20/15 0800 10/21/15 0621  NA 138 137 142  K 4.0 3.5 4.0  CL 103 105 107  CO2 26 24 26   GLUCOSE 113* 102* 96  BUN 8 9 10   CREATININE 0.92 0.73 0.79  CALCIUM 8.6* 8.1* 8.7*   Liver Function Tests:  Recent Labs Lab 10/21/15 0621  AST 44*  ALT 55  ALKPHOS 35*  BILITOT 0.6  PROT 6.4*  ALBUMIN 3.5   No results for input(s): LIPASE, AMYLASE in the last 168 hours. No results for input(s): AMMONIA in the last 168  hours. CBC:  Recent Labs Lab 10/18/15 0839 10/20/15 0800 10/21/15 0621  WBC 9.4 5.3 4.1  NEUTROABS  --  3.6  --   HGB 16.3 15.5 15.2  HCT 50.1 47.6 46.8  MCV 97.3 95.0 95.7  PLT 193 189 189   Cardiac Enzymes:  Recent Labs Lab 10/18/15 0813 10/20/15 0800  TROPONINI <0.03 <0.03   BNP (last 3 results)  Recent Labs  10/20/15 0800  BNP 99.0    ProBNP (last 3 results) No results for input(s): PROBNP in the last 8760 hours.  CBG: No results for input(s): GLUCAP in the last 168 hours.  Recent Results (from the past 240 hour(s))  Veritor Flu A/B Waived     Status: Abnormal   Collection Time: 10/17/15 11:44 AM  Result Value Ref Range Status   Influenza A Positive (A) Negative Final   Influenza B Negative Negative Final    Comment: If the test is negative for the presence of influenza A or influenza B antigen, infection due to influenza cannot be ruled-out because the antigen present in the sample may be below the detection limit of the test. It is recommended that these results be confirmed by viral culture or an FDA-cleared influenza A and B molecular assay.      Studies: Dg Chest 2 View  10/20/2015  CLINICAL DATA:  Shortness of Breath with worsening last 2 days EXAM: CHEST  2 VIEW COMPARISON:  10/18/2015 FINDINGS: Cardiomediastinal silhouette is unremarkable. No acute infiltrate or pleural effusion. No pulmonary edema. Mild hyperinflation. Bony thorax is unremarkable. IMPRESSION: Mild hyperinflation.  No active disease. Electronically Signed   By: Lahoma Crocker M.D.   On: 10/20/2015 08:53    Scheduled Meds: . aspirin  81 mg Oral Daily  . benzonatate  100 mg Oral TID  . chlorpheniramine-HYDROcodone  5 mL Oral Q12H  . docusate sodium  100 mg Oral BID  . enoxaparin (LOVENOX) injection  40 mg Subcutaneous Q24H  . ipratropium-albuterol  3 mL Nebulization Q6H  . levofloxacin (LEVAQUIN) IV  750 mg Intravenous Q24H  . metoprolol tartrate  25 mg Oral BID  .  mometasone-formoterol  2 puff Inhalation BID  . nicotine  14 mg Transdermal Daily  . oseltamivir  75 mg Oral BID  . potassium chloride  20 mEq Oral Daily   Continuous Infusions:   Assessment and plan: Principal Problem:   Influenza and pneumonia Active Problems:   Bilateral pneumonia   Acute respiratory failure with hypoxia (HCC)   Influenza A   COPD with exacerbation ([place])   OBESITY   CAD S/P percutaneous coronary angioplasty   Tobacco abuse  50 year old man who presented with influenza A, bilateral pneumonia, and COPD exacerbation causing acute respiratory failure with hypoxia. He was started on IV Levaquin. He received IV Solu-Medrol 2 days prior to admission in the ED. Initially he was not wheezing, but he does now have a few scattered wheezes. Tamiflu which was started prior to admission is continued. Duo nebulizers were also started every 6 hours. Dulera (substitute for Symbicort) inhaler was continued. Tessalon Perles and Tussionex were ordered for cough. Nicotine patch was placed for replacement therapy. He was instructed to stop smoking. TSH was within normal limits. Patient appears to be improving slowly.  Plan: 1. Continue current management as started. 2. Will add a prednisone taper. 3. We'll continue to monitor his oxygen needs and assess whether or not he needs home oxygen over the next 24-48 hours.   Time spent: 30 minutes    [facility] Pager [phone]. If 7PM-7AM, please contact night-coverage at [email or web address], password [facility] 10/21/2015, 1:39 PM  LOS: 1 day

## 2015-10-21 NOTE — Progress Notes (Signed)
   Patient Saturations on Room Air at Rest = 96%  Patient Saturations on [employer] while Ambulating = 88%  Patient Saturations on 2 Liters of oxygen while Ambulating = 94%

## 2015-10-22 MED ORDER — PREDNISONE 20 MG PO TABS: ORAL_TABLET | ORAL | Status: DC

## 2015-10-22 MED ORDER — OSELTAMIVIR PHOSPHATE 75 MG PO CAPS: 75.0000 mg | ORAL_CAPSULE | Freq: Two times a day (BID) | ORAL | Status: DC

## 2015-10-22 MED ORDER — LEVOFLOXACIN 750 MG PO TABS: 750.0000 mg | ORAL_TABLET | Freq: Every day | ORAL | Status: DC

## 2015-10-22 MED ORDER — CETYLPYRIDINIUM CHLORIDE 0.05 % MT LIQD
7.0000 mL | Freq: Two times a day (BID) | OROMUCOSAL | Status: DC
  Administered 2015-10-22 (×2): 7 mL via OROMUCOSAL

## 2015-10-22 MED ORDER — LEVOFLOXACIN 500 MG PO TABS: 500.0000 mg | ORAL_TABLET | Freq: Every day | ORAL | Status: DC

## 2015-10-22 NOTE — Discharge Summary (Signed)
Physician Discharge Summary  GLOVER DIBERT [id number] DOB: [birth year] DOA: 10/20/2015  PCP: [name], FNP  Admit date: 10/20/2015 Discharge date: 10/22/2015  Time spent:  Greater than 30 minutes  Recommendations for Outpatient Follow-up:  1.  Recommend tobacco cessation.     Discharge Diagnoses:   1. Bilateral community-acquired pneumonia.  2. Superimposed influenza A infection.  3. COPD with exacerbation; Mild  4. Acute respiratory failure with hypoxia secondary to pneumonia. Resolved.  5. CAD, status post PTCA in the past.  6. Tobacco abuse. Patient advised to stop smoking.  7. Bradycardia, asymptomatic, secondary to metoprolol.  8. Obesity.   Discharge Condition:  Improved.  Diet recommendation:  Heart healthy.  Filed Weights   10/20/15 1600 10/21/15 0506 10/22/15 0530  Weight: 118.933 kg (262 lb 3.2 oz) 119.84 kg (264 lb 3.2 oz) 118.026 kg (260 lb 3.2 oz)    History of present illness:    William Carson is a 50 y.o. male with a history of CAD, COPD, obesity, and tobacco abuse, who presented to the ED with a chief complaint of shortness of breath. Patient was diagnosed by his PCP with influenza A on 10/17/15. He was started on Tamiflu then. He presented to the ED on 10/18/15 because he was not getting any better. He was diagnosed with pneumonia at that time. He was treated with Levaquin and given a prescription for Levaquin. He did not feel any better. He tried to smoke, but could not due to his shortness of breath. He had some nonproductive coughing, but no pleurisy and no chest pain. He had subjective fever and chills but it subsided. He had some body aches. He denied nausea or vomiting, but had several loose stools. He denied black tarry stools or bright red blood per rectum. He denied swelling in his legs. He denied pain with urination. In the ED, he was afebrile and hemodynamically stable, but his blood pressure was on the low-normal side. His initial oxygen  saturation with ambulation on room air was 85%, but improved to 96% with nasal cannula oxygen. His chest x-ray revealed no active disease. His d-dimer was within normal limits. His BNP was within normal limits. Troponin I was negative. His EKG revealed sinus rhythm with a heart rate of 75 bpm and no concerning ST or T-wave abnormalities. He was admitted for further evaluation and management.    Hospital Course:  Although the patient's chest x-ray revealed no active disease, CT scan of his chest on 10/18/15 revealed emphysema and bilateral lower lobe pneumonia. Patient was continued on Tamiflu.  Levaquin was also continued, but was given IV. He was given IV Solu-Medrol several days ago in the ED, but initially, it was not continued. When he developed a few more wheezes, prednisone was started.  He was placed on nasal cannula oxygen. Oxygen was titrated to keep his oxygen saturations greater than 90%. Tussionex and Tessalon Perles were ordered for cough. He was strongly advised to stop smoking. A nicotine patch was placed. He was continued on his chronic medications for CAD including metoprolol and aspirin. Gentle IV fluids were started.  He developed mild bradycardia with a heart rate ranging from 52-60 bpm. This was thought to be secondary to metoprolol, but he was asymptomatic.Over the course of the hospitalization, he improved clinically and symptomatically. He had very few crackles and wheezes on exam at the time of discharge. He completed 4 days out of 5 days of Tamiflu therapy including dosing prior to admission. He received 2  additional days of Levaquin during the hospitalization. His oxygen saturations progressively improved. Prior to discharge, his oxygen saturation on room air at rest was 98% and with ambulation it was 92%.  He was discharged on 1 more day of Tamiflu and 2 more days of Levaquin as well as 3 more days of a short prednisone taper. He was instructed to continue using his inhalers that  were prescribed prior to hospital admission.  Procedures:   None  Consultations:   none  Discharge Exam: Filed Vitals:   10/21/15 2236 10/22/15 0530  BP: 117/78 119/61  Pulse: 67 52  Temp: 97.9 F (36.6 C) 97.7 F (36.5 C)  Resp: 20 22   oxygen saturation 98% on room air at rest; 92% when ambulating on room air.  General:  50 year old obese man who looks much better. Cardiovascular:  S1, S2, with mild bradycardia. Respiratory:  Rare wheezes; minimal crackles; breathing  Nonlabored.  Discharge Instructions   Discharge Instructions    Diet - low sodium heart healthy    Complete by:  As directed      Discharge instructions    Complete by:  As directed   Try not to smoke.     Increase activity slowly    Complete by:  As directed           Current Discharge Medication List    START taking these medications   Details  predniSONE (DELTASONE) 20 MG tablet Starting 10/23/15 take 1 tablet daily until completed. Qty: 3 tablet, Refills: 30      CONTINUE these medications which have CHANGED   Details  levofloxacin (LEVAQUIN) 500 MG tablet Take 1 tablet (500 mg total) by mouth daily. Continue for 3 more days.    oseltamivir (TAMIFLU) 75 MG capsule Take 1 capsule (75 mg total) by mouth 2 (two) times daily. Continue for 1 more day.   Associated Diagnoses: Influenza A      CONTINUE these medications which have NOT CHANGED   Details  albuterol (PROVENTIL HFA;VENTOLIN HFA) 108 (90 Base) MCG/ACT inhaler Inhale 1-2 puffs into the lungs every 6 (six) hours as needed for wheezing or shortness of breath. Qty: 1 Inhaler, Refills: 0    aspirin 81 MG EC tablet Take 81 mg by mouth daily.      budesonide-formoterol (SYMBICORT) 160-4.5 MCG/ACT inhaler Inhale 2 puffs into the lungs 2 (two) times daily. Qty: 1 Inhaler, Refills: 11   Associated Diagnoses: Pulmonary emphysema, unspecified emphysema type (HCC)    EPINEPHrine 0.3 mg/0.3 mL IJ SOAJ injection Inject 0.3 mg into the muscle  once.    HYDROcodone-acetaminophen (NORCO) 5-325 MG per tablet Take 1 tablet by mouth every 6 (six) hours as needed for moderate pain. Qty: 30 tablet, Refills: 0    HYDROcodone-homatropine (HYCODAN) 5-1.5 MG/5ML syrup Take 5 mLs by mouth every 8 (eight) hours as needed for cough. Qty: 120 mL, Refills: 0   Associated Diagnoses: Influenza A    metoprolol tartrate (LOPRESSOR) 25 MG tablet Take 1 tablet (25 mg total) by mouth 2 (two) times daily. Qty: 60 tablet, Refills: 11    nitroGLYCERIN (NITROSTAT) 0.4 MG SL tablet Place 1 tablet (0.4 mg total) under the tongue every 5 (five) minutes as needed for chest pain. May repeat up to 3 doses. Qty: 25 tablet, Refills: 3       Allergies  Allergen Reactions  . Other Swelling    Mammelain meat allergy   Follow-up Information    Follow up with [name], FNP. Schedule  an appointment as soon as possible for a visit in 1 week.   Specialty:  Family Medicine   Contact information:   [place] [id number] [phone]        The results of significant diagnostics from this hospitalization (including imaging, microbiology, ancillary and laboratory) are listed below for reference.    Significant Diagnostic Studies: Dg Chest 2 View  10/20/2015  CLINICAL DATA:  Shortness of Breath with worsening last 2 days EXAM: CHEST  2 VIEW COMPARISON:  10/18/2015 FINDINGS: Cardiomediastinal silhouette is unremarkable. No acute infiltrate or pleural effusion. No pulmonary edema. Mild hyperinflation. Bony thorax is unremarkable. IMPRESSION: Mild hyperinflation.  No active disease. Electronically Signed   By: Lahoma Crocker M.D.   On: 10/20/2015 08:53   Dg Chest 2 View  10/18/2015  CLINICAL DATA:  Cough. History of COPD and coronary artery disease. Recent diagnosis of flu. Complains of shortness of breath. EXAM: CHEST  2 VIEW COMPARISON:  12/09/2013.  CT 06/15/2010 FINDINGS: Lucency in the upper lungs suggest underlying emphysema. There is  concern for a 1.3 cm nodule along the periphery of the left upper lung. Few patchy densities in the lower lungs could represent atelectasis. Subtle airspace disease in left lung cannot be completely excluded. Heart and mediastinum are within normal limits. The trachea is midline. No pleural effusions. Degenerative endplate changes in the upper thoracic spine. IMPRESSION: Concern for a 1.3 cm nodule in the periphery of the left upper lung. Based on the history of COPD and evidence for emphysema, recommend further characterization with a chest CT to evaluate for a pulmonary nodule. Emphysematous changes with patchy densities in the lower lungs. Findings probably represent atelectasis but subtle infection cannot be excluded, these areas could also be better characterized with CT. Electronically Signed   By: Markus Daft M.D.   On: 10/18/2015 10:20   Ct Chest W Contrast  10/18/2015  CLINICAL DATA:  50 year old male with abnormal chest x-ray today. Cough and shortness of breath. Initial encounter. EXAM: CT CHEST WITH CONTRAST TECHNIQUE: Multidetector CT imaging of the chest was performed during intravenous contrast administration. CONTRAST:  16mL OMNIPAQUE IOHEXOL 300 MG/ML  SOLN COMPARISON:  Chest radiographs 0952 hours today. Chest CTA 06/15/2010. FINDINGS: Chronic centrilobular emphysema. Curvilinear bilateral lower lobe opacity most resembles a combination of scarring and atelectasis, although there are some air bronchograms with peribronchial thickening in the posterior medial basal segment of the right lower lobe. Stable mild scarring in the lingula since 2011. No finding to correspond to the earlier radiographic nodule. No lung nodule. Major airways are patent aside from some retained secretions in the trachea. No pleural effusion. No pericardial effusion. Calcified aortic and coronary artery atherosclerosis. No thoracic lymphadenopathy. Negative thoracic inlet. Chronic hepatic steatosis. Otherwise negative  visualized liver, spleen, pancreas, adrenal glands, kidneys, and bowel in the upper abdomen. No acute osseous abnormality identified. IMPRESSION: 1. Negative for pulmonary nodule. Suspect artifact in the left upper lobe on the chest radiograph from earlier today. 2. Emphysema. Small volume retained secretions in the trachea and bilateral lower lobe opacity greater on the right suspicious for acute infectious exacerbation (bronchopneumonia) superimposed on chronic lung scarring. No pleural effusion.  No thoracic lymphadenopathy. 3. Calcified aortic and coronary artery atherosclerosis. Electronically Signed   By: Genevie Ann M.D.   On: 10/18/2015 12:45    Microbiology: Recent Results (from the past 240 hour(s))  Veritor Flu A/B Waived     Status: Abnormal   Collection Time: 10/17/15 11:44 AM  Result Value Ref Range Status   Influenza A Positive (A) Negative Final   Influenza B Negative Negative Final    Comment: If the test is negative for the presence of influenza A or influenza B antigen, infection due to influenza cannot be ruled-out because the antigen present in the sample may be below the detection limit of the test. It is recommended that these results be confirmed by viral culture or an FDA-cleared influenza A and B molecular assay.      Labs: Basic Metabolic Panel:  Recent Labs Lab 10/18/15 0839 10/20/15 0800 10/21/15 0621  NA 138 137 142  K 4.0 3.5 4.0  CL 103 105 107  CO2 26 24 26   GLUCOSE 113* 102* 96  BUN 8 9 10   CREATININE 0.92 0.73 0.79  CALCIUM 8.6* 8.1* 8.7*   Liver Function Tests:  Recent Labs Lab 10/21/15 0621  AST 44*  ALT 55  ALKPHOS 35*  BILITOT 0.6  PROT 6.4*  ALBUMIN 3.5   No results for input(s): LIPASE, AMYLASE in the last 168 hours. No results for input(s): AMMONIA in the last 168 hours. CBC:  Recent Labs Lab 10/18/15 0839 10/20/15 0800 10/21/15 0621  WBC 9.4 5.3 4.1  NEUTROABS  --  3.6  --   HGB 16.3 15.5 15.2  HCT 50.1 47.6 46.8  MCV  97.3 95.0 95.7  PLT 193 189 189   Cardiac Enzymes:  Recent Labs Lab 10/18/15 0813 10/20/15 0800  TROPONINI <0.03 <0.03   BNP: BNP (last 3 results)  Recent Labs  10/20/15 0800  BNP 99.0    ProBNP (last 3 results) No results for input(s): PROBNP in the last 8760 hours.  CBG: No results for input(s): GLUCAP in the last 168 hours.     Signed:  Jazmyne Beauchesne MD.  [facility] 10/22/2015, 1:56 PM

## 2015-10-22 NOTE — Progress Notes (Signed)
Patient alert and oriented, independent, VSS, pt. Tolerating diet well. No complaints of pain or nausea. Pt. Had IV removed tip intact. Pt. Had prescriptions given. Pt. Voiced understanding of discharge instructions with no further questions. Pt. Discharged via wheelchair with auxilliary.  

## 2015-10-22 NOTE — Progress Notes (Addendum)
SATURATION QUALIFICATIONS:   Patient Saturations on Room Air at Rest = 98%  Patient Saturations on [employer] while Ambulating (1000 feet)  = 92%  Patient Saturations on Room Air at Rest/sitting post ambulation = 94%

## 2015-10-31 ENCOUNTER — Ambulatory Visit (INDEPENDENT_AMBULATORY_CARE_PROVIDER_SITE_OTHER): Payer: BLUE CROSS/BLUE SHIELD | Admitting: Family Medicine

## 2015-10-31 ENCOUNTER — Encounter: Payer: Self-pay | Admitting: Family Medicine

## 2015-10-31 VITALS — BP 113/66 | HR 58 | Temp 97.1°F | Ht 75.0 in | Wt 258.4 lb

## 2015-10-31 DIAGNOSIS — J189 Pneumonia, unspecified organism: Secondary | ICD-10-CM

## 2015-10-31 DIAGNOSIS — Z720 Tobacco use: Secondary | ICD-10-CM

## 2015-10-31 DIAGNOSIS — J449 Chronic obstructive pulmonary disease, unspecified: Secondary | ICD-10-CM | POA: Diagnosis not present

## 2015-10-31 DIAGNOSIS — J101 Influenza due to other identified influenza virus with other respiratory manifestations: Secondary | ICD-10-CM

## 2015-10-31 DIAGNOSIS — J438 Other emphysema: Secondary | ICD-10-CM | POA: Diagnosis not present

## 2015-10-31 MED ORDER — ALBUTEROL SULFATE (2.5 MG/3ML) 0.083% IN NEBU
2.5000 mg | INHALATION_SOLUTION | Freq: Once | RESPIRATORY_TRACT | Status: AC
  Administered 2015-10-31: 2.5 mg via RESPIRATORY_TRACT

## 2015-10-31 MED ORDER — ALBUTEROL SULFATE (2.5 MG/3ML) 0.083% IN NEBU: 2.5000 mg | INHALATION_SOLUTION | RESPIRATORY_TRACT | Status: DC | PRN

## 2015-10-31 NOTE — Progress Notes (Signed)
   HPI  Patient presents today for hospital follow-up after influenza in bilateral pneumonia.  He states that since leaving the hospital he feels much better, however has bruxism's of malaise and shortness of breath. He has burning type chest pain with deep inspiration and cough. He has no chest pain with exertion. He does have shortness of breath with exertion and his baseline breathing is not as good as usual.  He has not been using albuterol, he has been using Dulera as a rescue inhaler.  He is tolerating fluids well, at times he has difficulty eating. He feels well currently and denies any shortness of breath  PMH: Smoking status noted ROS: Per HPI  Objective: BP 113/66 mmHg  Pulse 58  Temp(Src) 97.1 F (36.2 C) (Oral)  Ht '6\' 3"'$  (1.905 m)  Wt 258 lb 6.4 oz (117.209 kg)  BMI 32.30 kg/m2 Gen: NAD, alert, cooperative with exam HEENT: NCAT CV: RRR, good S1/S2, no murmur Resp: Nonlabored, good air movement, clear Ext: No edema, warm Neuro: Alert and oriented, No gross deficits  Nebulizer treatment given in the clinic for education purposes as well as symptomatic treatment  Assessment and plan:  # Influenza,  bilateral pneumonia Resolving Breathing better but still having bruxism's of what sounds like flulike symptoms. Given his underlying lung disease is likely that he still hasn't completely cleared the flu. Nebulizer prescribed, the family thinks that he will tolerate this better  # COPD, emphysema With severe symptoms and multiple exacerbations I'm referring him to pulmonology for their opinion on treatment  He has quit smoking- I congratulated him     Orders Placed This Encounter  Procedures  . CBC with Differential  . CMP14+EGFR  . Ambulatory referral to Pulmonology    Referral Priority:  Routine    Referral Type:  Consultation    Referral Reason:  Specialty Services Required    Requested Specialty:  Pulmonary Disease    Number of Visits Requested:  1     Meds ordered this encounter  Medications  . albuterol (PROVENTIL) (2.5 MG/3ML) 0.083% nebulizer solution    Sig: Take 3 mLs (2.5 mg total) by nebulization every 4 (four) hours as needed for wheezing or shortness of breath.    Dispense:  150 mL    Refill:  [place], MD [place] 10/31/2015, 11:07 AM

## 2015-10-31 NOTE — Patient Instructions (Signed)
Great to meet you!  I thik you will steadily improve over the next 2 weeks, you have had a very serious illness and it will take time to come back from it.   Use albuterol as a rescue inhaler.   Please call or come back with any concerns

## 2015-10-31 NOTE — Addendum Note (Signed)
Addended by: Nigel Berthold C on: 10/31/2015 11:33 AM   Modules accepted: Orders

## 2015-11-01 DIAGNOSIS — J449 Chronic obstructive pulmonary disease, unspecified: Secondary | ICD-10-CM | POA: Diagnosis not present

## 2015-11-01 LAB — CBC WITH DIFFERENTIAL/PLATELET
BASOS: 1 %
Basophils Absolute: 0.1 10*3/uL (ref 0.0–0.2)
EOS (ABSOLUTE): 0.1 10*3/uL (ref 0.0–0.4)
EOS: 1 %
Hematocrit: 48.2 % (ref 37.5–51.0)
Hemoglobin: 16.3 g/dL (ref 12.6–17.7)
Immature Grans (Abs): 0 10*3/uL (ref 0.0–0.1)
Immature Granulocytes: 0 %
Lymphocytes Absolute: 2.4 10*3/uL (ref 0.7–3.1)
Lymphs: 20 %
MCH: 30.5 pg (ref 26.6–33.0)
MCHC: 33.8 g/dL (ref 31.5–35.7)
MCV: 90 fL (ref 79–97)
MONOS ABS: 1 10*3/uL — AB (ref 0.1–0.9)
Monocytes: 8 %
NEUTROS ABS: 8.5 10*3/uL — AB (ref 1.4–7.0)
Neutrophils: 70 %
PLATELETS: 360 10*3/uL (ref 150–379)
RBC: 5.35 x10E6/uL (ref 4.14–5.80)
RDW: 15.3 % (ref 12.3–15.4)
WBC: 12 10*3/uL — ABNORMAL HIGH (ref 3.4–10.8)

## 2015-11-01 LAB — CMP14+EGFR
ALT: 40 IU/L (ref 0–44)
AST: 22 IU/L (ref 0–40)
Albumin/Globulin Ratio: 1.6 (ref 1.2–2.2)
Albumin: 4.2 g/dL (ref 3.5–5.5)
Alkaline Phosphatase: 47 IU/L (ref 39–117)
BUN/Creatinine Ratio: 10 (ref 9–20)
BUN: 10 mg/dL (ref 6–24)
Bilirubin Total: 0.8 mg/dL (ref 0.0–1.2)
CALCIUM: 9.9 mg/dL (ref 8.7–10.2)
CO2: 19 mmol/L (ref 18–29)
CREATININE: 0.98 mg/dL (ref 0.76–1.27)
Chloride: 102 mmol/L (ref 96–106)
GFR, EST AFRICAN AMERICAN: 103 mL/min/{1.73_m2} (ref >59)
GFR, EST NON AFRICAN AMERICAN: 89 mL/min/{1.73_m2} (ref >59)
GLUCOSE: 118 mg/dL — AB (ref 65–99)
Globulin, Total: 2.6 g/dL (ref 1.5–4.5)
Potassium: 4.9 mmol/L (ref 3.5–5.2)
Sodium: 142 mmol/L (ref 134–144)
TOTAL PROTEIN: 6.8 g/dL (ref 6.0–8.5)

## 2015-11-02 ENCOUNTER — Telehealth: Payer: Self-pay | Admitting: Family Medicine

## 2015-11-02 NOTE — Telephone Encounter (Signed)
Called and discussed labs  Elavted WBC above when he left the hosp, with L shift. However he feels better.   Discussed cautions to get seen again if needed.   He understands.   Laroy Apple, MD [facility] 11/02/2015, 1:28 PM

## 2015-11-09 ENCOUNTER — Ambulatory Visit (INDEPENDENT_AMBULATORY_CARE_PROVIDER_SITE_OTHER): Payer: BLUE CROSS/BLUE SHIELD | Admitting: Family

## 2015-11-09 ENCOUNTER — Encounter: Payer: Self-pay | Admitting: Family

## 2015-11-09 VITALS — BP 134/80 | HR 84 | Temp 97.3°F | Ht 75.0 in | Wt 266.0 lb

## 2015-11-09 DIAGNOSIS — J189 Pneumonia, unspecified organism: Secondary | ICD-10-CM | POA: Diagnosis not present

## 2015-11-09 DIAGNOSIS — J438 Other emphysema: Secondary | ICD-10-CM | POA: Diagnosis not present

## 2015-11-09 MED ORDER — PREDNISONE 10 MG (21) PO TBPK: 10.0000 mg | ORAL_TABLET | Freq: Every day | ORAL | Status: DC

## 2015-11-09 MED ORDER — DOXYCYCLINE HYCLATE 100 MG PO TABS: 100.0000 mg | ORAL_TABLET | Freq: Two times a day (BID) | ORAL | Status: DC

## 2015-11-09 NOTE — Patient Instructions (Signed)

## 2015-11-09 NOTE — Progress Notes (Signed)
Subjective:    Patient ID: William Carson, male    DOB: 1965-12-19, 50 y.o.   MRN: [id number]  Pt presents to the office with cough and fever. PT was treated for Flu and Bilateral CAP. PT was given levaquin and prednisone, but states he felt better for awhile, but now feels horrible.  Fever  This is a recurrent problem. The current episode started 1 to 4 weeks ago. The problem occurs constantly. The maximum temperature noted was 99 to 99.9 F. Associated symptoms include congestion, coughing, headaches, muscle aches and sleepiness. Pertinent negatives include no chest pain, nausea, sore throat or vomiting. He has tried acetaminophen for the symptoms. The treatment provided mild relief.  Headache  Associated symptoms include coughing, a fever and muscle aches. Pertinent negatives include no nausea, sore throat or vomiting.      Review of Systems  Constitutional: Positive for fever.  HENT: Positive for congestion. Negative for sore throat.   Respiratory: Positive for cough.   Cardiovascular: Negative.  Negative for chest pain.  Gastrointestinal: Negative.  Negative for nausea and vomiting.  Endocrine: Negative.   Genitourinary: Negative.   Musculoskeletal: Negative.   Neurological: Positive for headaches.  Hematological: Negative.   Psychiatric/Behavioral: Negative.   All other systems reviewed and are negative.      Objective:   Physical Exam  Constitutional: He is oriented to person, place, and time. He appears well-developed and well-nourished. No distress.  HENT:  Head: Normocephalic.  Right Ear: External ear normal.  Left Ear: External ear normal.  Nasal passage erythemas with mild swelling  Oropharynx erythemas  Eyes: Pupils are equal, round, and reactive to light. Right eye exhibits no discharge. Left eye exhibits no discharge.  Neck: Normal range of motion. Neck supple. No thyromegaly present.  Cardiovascular: Normal rate, regular rhythm, normal heart sounds and  intact distal pulses.   No murmur heard. Pulmonary/Chest: Effort normal. No respiratory distress. He has wheezes.  Abdominal: Soft. Bowel sounds are normal. He exhibits no distension. There is no tenderness.  Musculoskeletal: Normal range of motion. He exhibits no edema or tenderness.  Neurological: He is alert and oriented to person, place, and time. He has normal reflexes. No cranial nerve deficit.  Skin: Skin is warm and dry. No rash noted. No erythema.  Psychiatric: He has a normal mood and affect. His behavior is normal. Judgment and thought content normal.  Vitals reviewed.     BP 134/80 mmHg  Pulse 84  Temp(Src) 97.3 F (36.3 C) (Oral)  Ht 6\' 3"  (1.905 m)  Wt 266 lb (120.657 kg)  BMI 33.25 kg/m2     Assessment & Plan:  1. Other emphysema ([place]) -Smoking cessation discussed -Continue Symbicort - doxycycline (VIBRA-TABS) 100 MG tablet; Take 1 tablet (100 mg total) by mouth 2 (two) times daily.  Dispense: 20 tablet; Refill: 0 - predniSONE (STERAPRED UNI-PAK 21 TAB) 10 MG (21) TBPK tablet; Take 1 tablet (10 mg total) by mouth daily. As directed x 6 days  Dispense: 21 tablet; Refill: 0  2. Bilateral pneumonia -- Take meds as prescribed - Use a cool mist humidifier  -Use saline nose sprays frequently -Saline irrigations of the nose can be very helpful if done frequently.  * 4X daily for 1 week*  * Use of a nettie pot can be helpful with this. Follow directions with this* -Force fluids -For any cough or congestion  Use plain Mucinex- regular strength or max strength is fine   * Children- consult with Pharmacist for  dosing -For fever or aces or pains- take tylenol or ibuprofen appropriate for age and weight.  * for fevers greater than 101 orally you may alternate ibuprofen and tylenol every  3 hours. -Throat lozenges if help -RTO in 1 week  - doxycycline (VIBRA-TABS) 100 MG tablet; Take 1 tablet (100 mg total) by mouth 2 (two) times daily.  Dispense: 20 tablet; Refill: 0 -  predniSONE (STERAPRED UNI-PAK 21 TAB) 10 MG (21) TBPK tablet; Take 1 tablet (10 mg total) by mouth daily. As directed x 6 days  Dispense: 21 tablet; Refill: 0  [name], FNP

## 2015-11-15 ENCOUNTER — Encounter: Payer: Self-pay | Admitting: *Deleted

## 2015-11-16 ENCOUNTER — Encounter: Payer: Self-pay | Admitting: Family Medicine

## 2015-11-16 ENCOUNTER — Ambulatory Visit (INDEPENDENT_AMBULATORY_CARE_PROVIDER_SITE_OTHER): Payer: BLUE CROSS/BLUE SHIELD | Admitting: Family Medicine

## 2015-11-16 VITALS — BP 119/75 | HR 84 | Temp 96.9°F | Ht 75.0 in | Wt 268.6 lb

## 2015-11-16 DIAGNOSIS — J1100 Influenza due to unidentified influenza virus with unspecified type of pneumonia: Secondary | ICD-10-CM

## 2015-11-16 DIAGNOSIS — J189 Pneumonia, unspecified organism: Secondary | ICD-10-CM

## 2015-11-16 NOTE — Progress Notes (Signed)
   HPI  Patient presents today for follow-up.  Patient was hospitalized March 31 for bilateral pneumonia and influenza.  Since that time he feels much better than when he first went to the hospital but has had lingering fatigue, malaise, and night sweats since that time.  Last week he was seen and started on a course of doxycycline and prednisone. He was wheezing badly that time and his breathing was getting worse.  He is breathing better than he was a week ago states that he largely feels the same.  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  PMH: Smoking status noted ROS: Per HPI  Objective: BP 119/75 mmHg  Pulse 84  Temp(Src) 96.9 F (36.1 C) (Oral)  Ht 6\' 3"  (1.905 m)  Wt 268 lb 9.6 oz (121.836 kg)  BMI 33.57 kg/m2 Gen: NAD, alert, cooperative with exam HEENT: NCAT CV: RRR, good S1/S2, no murmur Resp: CTABL, no wheezes, non-labored Ext: No edema, warm Neuro: Alert and oriented, No gross deficits  Assessment and plan:  # Influenza, bilateral pneumonia Improving slowly, his breathing is improved after the new course of prednisone He's finishing his course of doxycycline which was started last week. Reassurance provided Low threshold for follow-up Follow-up 2-3 weeks otherwise Recommended that he is okay to go back to work but if he has worsening symptoms after starting he needs to stay home   Laroy Apple, MD [place] 11/16/2015, 3:46 PM

## 2015-11-16 NOTE — Patient Instructions (Signed)
Great to see you!  Please come back if anything changes or if you get worse.   Lets plan to see you in 2-3 weeks, I expect a long slow recovery for the next 6-8 weeks

## 2015-12-01 ENCOUNTER — Ambulatory Visit (INDEPENDENT_AMBULATORY_CARE_PROVIDER_SITE_OTHER): Payer: BLUE CROSS/BLUE SHIELD | Admitting: Family Medicine

## 2015-12-01 ENCOUNTER — Encounter: Payer: Self-pay | Admitting: Family Medicine

## 2015-12-01 VITALS — BP 129/81 | HR 76 | Temp 96.7°F | Ht 75.0 in | Wt 270.6 lb

## 2015-12-01 DIAGNOSIS — I2510 Atherosclerotic heart disease of native coronary artery without angina pectoris: Secondary | ICD-10-CM | POA: Diagnosis not present

## 2015-12-01 DIAGNOSIS — Z720 Tobacco use: Secondary | ICD-10-CM | POA: Diagnosis not present

## 2015-12-01 DIAGNOSIS — J1100 Influenza due to unidentified influenza virus with unspecified type of pneumonia: Secondary | ICD-10-CM

## 2015-12-01 DIAGNOSIS — Z9861 Coronary angioplasty status: Secondary | ICD-10-CM | POA: Diagnosis not present

## 2015-12-01 MED ORDER — NITROGLYCERIN 0.4 MG SL SUBL: 0.4000 mg | SUBLINGUAL_TABLET | SUBLINGUAL | Status: DC | PRN

## 2015-12-01 NOTE — Patient Instructions (Addendum)
Great to see you!  I am glad you are getting better.   Lets see you again in July unless you need [place] sooner.

## 2015-12-01 NOTE — Progress Notes (Signed)
   HPI  Patient presents today for follow-up for recent illness.  Patient states that he is beginning to cough more and clear more pulmonary secretions, overall he feels much better. His breathing is good.  He is tolerating food and fluids normally.  He has gone back to work.  He feels like he is getting tired more easily than he was previously having pneumonia and the flu. By he feels much better in general.  He has not had any chest pain since he had his heart attack 5 years ago, he has not had a refill of nitroglycerin in 5 years. He needs a refill today.  Hypertension Has good medication compliance.  PMH: Smoking status noted ROS: Per HPI  Objective: BP 129/81 mmHg  Pulse 76  Temp(Src) 96.7 F (35.9 C) (Oral)  Ht 6\' 3"  (1.905 m)  Wt 270 lb 9.6 oz (122.743 kg)  BMI 33.82 kg/m2 Gen: NAD, alert, cooperative with exam HEENT: NCAT CV: RRR, good S1/S2, no murmur Resp: CTABL, no wheezes, non-labored Ext: No edema, warm Neuro: Alert and oriented, No gross deficits  Assessment and plan:  # Bilateral pneumonia, influenza Resolved, he is still slowly recovering from the perspective of regaining energy and appetite. His cough has become more productive than his breathing feels better. Overall he is much better energy. He's gone back to work I discussed with him the usual course of illness and slow recovery after this serious illness Return to clinic in 2 months unless needed sooner.  CAD status post 2 stents Doing well On aspirin and beta blocker Nitroglycerin refilled, no chest pain, just to have a fresh supply. After he left I have reviewed his chart and appears that he has not tolerated statins in the past, to discuss with him at the next visit, consider [facility]-9 nhibitors  Tobacco abuse He is stop smoking, I congratulated him and encouraged him to never re-start  Meds ordered this encounter  Medications  . nitroGLYCERIN (NITROSTAT) 0.4 MG SL tablet    Sig: Place 1  tablet (0.4 mg total) under the tongue every 5 (five) minutes as needed for chest pain. May repeat up to 3 doses.    Dispense:  25 tablet    Refill:  3    Pt will fill with next [place], MD [facility] 12/01/2015, 4:12 PM

## 2015-12-06 ENCOUNTER — Encounter: Payer: Self-pay | Admitting: Emergency Medicine

## 2015-12-06 ENCOUNTER — Ambulatory Visit (INDEPENDENT_AMBULATORY_CARE_PROVIDER_SITE_OTHER): Payer: BLUE CROSS/BLUE SHIELD | Admitting: Emergency Medicine

## 2015-12-06 VITALS — BP 112/78 | HR 74 | Ht 75.0 in | Wt 272.0 lb

## 2015-12-06 DIAGNOSIS — J449 Chronic obstructive pulmonary disease, unspecified: Secondary | ICD-10-CM

## 2015-12-06 DIAGNOSIS — Z720 Tobacco use: Secondary | ICD-10-CM | POA: Diagnosis not present

## 2015-12-06 NOTE — Assessment & Plan Note (Signed)
COPD with class III symptoms. He has clear exertional limitation. He has not returned to baseline since his hospitalization for influenza and pneumonia. He has daily cough productive of purulent sputum. I would like to check full pulmonary function testing. He would likely benefit from an alternative to Symbicort, probably a LAMA/LABA. We will consider changing this after his PFT. Walking oximetry today. He had the Pneumovax in 2011. He should get prevnar-13.

## 2015-12-06 NOTE — Patient Instructions (Signed)
Walking oximetry today Please continue Symbicort 2 puffs twice a day for now. We will consider changing this in the future depending on your symptoms and your breathing tests. We will perform full pulmonary function testing Follow with Dr Lamonte Sakai next available after your breathing tests to review the results.

## 2015-12-06 NOTE — Assessment & Plan Note (Signed)
Congratulated him on his cessation. We discussed ways to continue to succeed

## 2015-12-06 NOTE — Progress Notes (Signed)
Subjective:    Patient ID: William Carson, male    DOB: 1965/12/17, 50 y.o.   MRN: [id number]  HPI 50 yo man, history tobacco use (45-pack-years), coronary artery disease, hyperlipidemia. He carries a diagnosis of COPD that was made about 4-5 years ago, was started on Symbicort a couple years ago (was only using qd). He was treated for influenza compensated by bilateral computer acquired pneumonia 3/31 - 4/2. He continues to have exertional dyspnea, worse since this hospitalization. He has cough, happens every day, prod of colored mucous. He does not wheeze. He quit smoking end of March.    Review of Systems  Constitutional: Negative for fever and unexpected weight change.  HENT: Positive for congestion. Negative for dental problem, ear pain, nosebleeds, postnasal drip, rhinorrhea, sinus pressure, sneezing, sore throat and trouble swallowing.   Eyes: Negative for redness and itching.  Respiratory: Positive for cough and shortness of breath. Negative for chest tightness and wheezing.   Cardiovascular: Negative for palpitations and leg swelling.  Gastrointestinal: Negative for nausea and vomiting.  Genitourinary: Negative for dysuria.  Musculoskeletal: Negative for joint swelling.  Skin: Negative for rash.  Neurological: Negative for headaches.  Hematological: Does not bruise/bleed easily.  Psychiatric/Behavioral: Negative for dysphoric mood. The patient is not nervous/anxious.    Past Medical History  Diagnosis Date  . Hyperlipidemia   . Coronary artery disease   . Obesity   . COPD (chronic obstructive pulmonary disease) ([place])   . Adenomatous polyps   . Diverticulosis   . Hemorrhoids   . Influenza A 10/20/2015     Family History  Problem Relation Age of Onset  . Heart attack Mother     Infarction  . Arrhythmia Father     Atrial fibrillation     Social History   Social History  . Marital Status: Married    Spouse Name: N/A  . Number of Children: N/A  . Years of  Education: N/A   Occupational History  . [employer] Other    [place], [place]   Social History Main Topics  . Smoking status: Former Smoker -- 1.50 packs/day for 30 years    Types: Cigarettes    Quit date: 10/16/2015  . Smokeless tobacco: Former Systems developer  . Alcohol Use: 0.6 oz/week    1 Standard drinks or equivalent per week  . Drug Use: No  . Sexual Activity: Not on file   Other Topics Concern  . 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 Has lived locally   Allergies  Allergen Reactions  . Other Swelling    Mammelain meat allergy     Outpatient Prescriptions Prior to Visit  Medication Sig Dispense Refill  . albuterol (PROVENTIL HFA;VENTOLIN HFA) 108 (90 Base) MCG/ACT inhaler Inhale 1-2 puffs into the lungs every 6 (six) hours as needed for wheezing or shortness of breath. 1 Inhaler 0  . albuterol (PROVENTIL) (2.5 MG/3ML) 0.083% nebulizer solution Take 3 mLs (2.5 mg total) by nebulization every 4 (four) hours as needed for wheezing or shortness of breath. 150 mL 1  . aspirin 81 MG EC tablet Take 81 mg by mouth daily.      . budesonide-formoterol (SYMBICORT) 160-4.5 MCG/ACT inhaler Inhale 2 puffs into the lungs 2 (two) times daily. 1 Inhaler 11  . EPINEPHrine 0.3 mg/0.3 mL IJ  SOAJ injection Inject 0.3 mg into the muscle once.    Marland Kitchen HYDROcodone-acetaminophen (NORCO) 5-325 MG per tablet Take 1 tablet by mouth every 6 (six) hours as needed for moderate pain. 30 tablet 0  . metoprolol tartrate (LOPRESSOR) 25 MG tablet Take 1 tablet (25 mg total) by mouth 2 (two) times daily. 60 tablet 11  . nitroGLYCERIN (NITROSTAT) 0.4 MG SL tablet Place 1 tablet (0.4 mg total) under the tongue every 5 (five) minutes as needed for chest pain. May repeat up to 3 doses. 25 tablet 3   No facility-administered medications prior to visit.           Objective:   Physical Exam  Filed Vitals:   12/06/15 1550 12/06/15 1551  BP:  112/78  Pulse:  74  Height: 6\' 3"  (1.905 m)   Weight: 272 lb (123.378 kg)   SpO2:  93%   Gen: Pleasant, obese man, in no distress,  normal affect  ENT: No lesions,  mouth clear,  oropharynx clear, no postnasal drip  Neck: No JVD, no TMG, no carotid bruits  Lungs: No use of accessory muscles, distant, clear without rales or rhonchi  Cardiovascular: RRR, heart sounds normal, no murmur or gallops, no peripheral edema  Musculoskeletal: No deformities, no cyanosis or clubbing  Neuro: alert, non focal  Skin: Warm, no lesions or rashes    10/18/15 -  COMPARISON: Chest radiographs 0952 hours today. Chest CTA 06/15/2010.  FINDINGS: Chronic centrilobular emphysema. Curvilinear bilateral lower lobe opacity most resembles a combination of scarring and atelectasis, although there are some air bronchograms with peribronchial thickening in the posterior medial basal segment of the right lower lobe. Stable mild scarring in the lingula since 2011. No finding to correspond to the earlier radiographic nodule. No lung nodule. Major airways are patent aside from some retained secretions in the trachea.  No pleural effusion. No pericardial effusion. Calcified aortic and coronary artery atherosclerosis.  No thoracic lymphadenopathy.  Negative thoracic inlet. Chronic hepatic steatosis. Otherwise negative visualized liver, spleen, pancreas, adrenal glands, kidneys, and bowel in the upper abdomen.  No acute osseous abnormality identified.  IMPRESSION: 1. Negative for pulmonary nodule. Suspect artifact in the left upper lobe on the chest radiograph from earlier today. 2. Emphysema. Small volume retained secretions in the trachea and bilateral lower lobe opacity greater on the right suspicious for acute infectious exacerbation (bronchopneumonia) superimposed on chronic lung scarring. No pleural  effusion. No thoracic lymphadenopathy. 3. Calcified aortic and coronary artery atherosclerosis.       Assessment & Plan:  COPD (chronic obstructive pulmonary disease) COPD with class III symptoms. He has clear exertional limitation. He has not returned to baseline since his hospitalization for influenza and pneumonia. He has daily cough productive of purulent sputum. I would like to check full pulmonary function testing. He would likely benefit from an alternative to Symbicort, probably a LAMA/LABA. We will consider changing this after his PFT. Walking oximetry today. He had the Pneumovax in 2011. He should get prevnar-13.   Tobacco abuse Congratulated him on his cessation. We discussed ways to continue to succeed   Baltazar Apo, MD, PhD 12/06/2015, 4:23 PM [facility] [phone] or if no answer [phone]

## 2016-02-01 ENCOUNTER — Ambulatory Visit: Payer: BLUE CROSS/BLUE SHIELD | Admitting: Family Medicine

## 2016-02-19 ENCOUNTER — Other Ambulatory Visit: Payer: Self-pay | Admitting: *Deleted

## 2016-02-19 MED ORDER — EPINEPHRINE 0.3 MG/0.3ML IJ SOAJ
0.3000 mg | Freq: Once | INTRAMUSCULAR | 1 refills | Status: AC
Start: 2016-02-19 — End: 2016-02-19

## 2016-02-27 ENCOUNTER — Encounter: Payer: Self-pay | Admitting: Emergency Medicine

## 2016-02-27 ENCOUNTER — Ambulatory Visit (INDEPENDENT_AMBULATORY_CARE_PROVIDER_SITE_OTHER): Payer: BLUE CROSS/BLUE SHIELD | Admitting: Emergency Medicine

## 2016-02-27 DIAGNOSIS — J449 Chronic obstructive pulmonary disease, unspecified: Secondary | ICD-10-CM | POA: Diagnosis not present

## 2016-02-27 DIAGNOSIS — J438 Other emphysema: Secondary | ICD-10-CM

## 2016-02-27 LAB — PULMONARY FUNCTION TEST
DL/VA % PRED: 66 %
DL/VA: 3.24 ml/min/mmHg/L
DLCO COR % PRED: 63 %
DLCO cor: 24.12 ml/min/mmHg
DLCO unc % pred: 62 %
DLCO unc: 23.63 ml/min/mmHg
FEF 25-75 POST: 1.13 L/s
FEF 25-75 Pre: 0.94 L/sec
FEF2575-%CHANGE-POST: 20 %
FEF2575-%PRED-POST: 29 %
FEF2575-%Pred-Pre: 24 %
FEV1-%CHANGE-POST: 5 %
FEV1-%Pred-Post: 53 %
FEV1-%Pred-Pre: 50 %
FEV1-PRE: 2.23 L
FEV1-Post: 2.35 L
FEV1FVC-%Change-Post: 2 %
FEV1FVC-%PRED-PRE: 66 %
FEV6-%Change-Post: 4 %
FEV6-%Pred-Post: 79 %
FEV6-%Pred-Pre: 75 %
FEV6-POST: 4.36 L
FEV6-PRE: 4.15 L
FEV6FVC-%Change-Post: 1 %
FEV6FVC-%PRED-PRE: 99 %
FEV6FVC-%Pred-Post: 100 %
FVC-%CHANGE-POST: 3 %
FVC-%PRED-PRE: 76 %
FVC-%Pred-Post: 78 %
FVC-PRE: 4.36 L
FVC-Post: 4.5 L
PRE FEV6/FVC RATIO: 95 %
Post FEV1/FVC ratio: 52 %
Post FEV6/FVC ratio: 97 %
Pre FEV1/FVC ratio: 51 %
RV % PRED: 169 %
RV: 3.89 L
TLC % pred: 111 %
TLC: 8.64 L

## 2016-02-27 MED ORDER — EPINEPHRINE 0.3 MG/0.3ML IJ SOAJ: 0.3000 mg | Freq: Once | INTRAMUSCULAR | 0 refills | Status: AC

## 2016-02-27 MED ORDER — TIOTROPIUM BROMIDE-OLODATEROL 2.5-2.5 MCG/ACT IN AERS: 2.0000 | INHALATION_SPRAY | Freq: Every day | RESPIRATORY_TRACT | 0 refills | Status: AC

## 2016-02-27 MED ORDER — TIOTROPIUM BROMIDE-OLODATEROL 2.5-2.5 MCG/ACT IN AERS: 2.0000 | INHALATION_SPRAY | Freq: Every day | RESPIRATORY_TRACT | 5 refills | Status: DC

## 2016-02-27 NOTE — Assessment & Plan Note (Signed)
Pulmonary function testing today confirms severe obstructive lung disease without bronchodilator response. He is currently using Symbicort but only once a day. I believe that this is a good opportunity for [place] to change him to LABA/LAMA. We will try changing to Stiolto 2 puffs once a day to see if he benefits from it. If so we will continue it.

## 2016-02-27 NOTE — Addendum Note (Signed)
Addended by: Maryanna Shape A on: 02/27/2016 04:51 PM   Modules accepted: Orders

## 2016-02-27 NOTE — Progress Notes (Signed)
Patient ID: William Carson, male   DOB: 1966-04-06, 50 y.o.   MRN: [id number] Patient seen in the office today and instructed on use of stiolto respimat.  Patient expressed understanding and demonstrated technique.

## 2016-02-27 NOTE — Patient Instructions (Signed)
We will perform a trial of changing Symbicort to Stiolto 2 puffs once a day Take albuterol 2 puffs up to every 4 hours if needed for shortness of breath.  Congratulations on stopping smoking Get the flu shot this Fall Follow with Dr Lamonte Sakai in 4 months or sooner if you have any problems.

## 2016-02-27 NOTE — Progress Notes (Signed)
Subjective:    Patient ID: William Carson, male    DOB: 10/02/1965, 50 y.o.   MRN: [id number]  HPI 50 yo man, history tobacco use (45-pack-years), coronary artery disease, hyperlipidemia. He carries a diagnosis of COPD that was made about 4-5 years ago, was started on Symbicort a couple years ago (was only using qd). He was treated for influenza compensated by bilateral Community acquired pneumonia 3/31 - 4/2. He continues to have exertional dyspnea, worse since this hospitalization. He has cough, happens every day, prod of colored mucous. He does not wheeze. He quit smoking end of March.   ROV 02/27/16 -- Follow-up visit for history of tobacco use, COPD and recent hospitalization for bilateral pneumonias. He underwent pulmonary function testing Today and have reviewed results. This consistent with severe obstructive lung disease without bronchodilator response, possible superimposed restriction but evidence for hyperinflation on lung volumes, increased diffusion capacity. He is currently managed on Symbicort, only taking in the am. His bfreathing does sometimes limit him, heavy exertion. Minimal cough.     Review of Systems  Constitutional: Negative for fever and unexpected weight change.  HENT: Negative for congestion, dental problem, ear pain, nosebleeds, postnasal drip, rhinorrhea, sinus pressure, sneezing, sore throat and trouble swallowing.   Eyes: Negative for redness and itching.  Respiratory: Positive for shortness of breath. Negative for chest tightness and wheezing.   Cardiovascular: Negative for palpitations and leg swelling.  Gastrointestinal: Negative for nausea and vomiting.  Genitourinary: Negative for dysuria.  Musculoskeletal: Negative for joint swelling.  Skin: Negative for rash.  Neurological: Negative for headaches.  Hematological: Does not bruise/bleed easily.  Psychiatric/Behavioral: Negative for dysphoric mood. The patient is not nervous/anxious.        Objective:     Physical Exam  Vitals:   02/27/16 1556  BP: 120/72  Pulse: 77  SpO2: 95%  Weight: 289 lb (131.1 kg)  Height: 6\' 2"  (1.88 m)   Gen: Pleasant, obese man, in no distress,  normal affect  ENT: No lesions,  mouth clear,  oropharynx clear, no postnasal drip  Neck: No JVD, no TMG, no carotid bruits  Lungs: No use of accessory muscles, distant, clear without rales or rhonchi  Cardiovascular: RRR, heart sounds normal, no murmur or gallops, no peripheral edema  Musculoskeletal: No deformities, no cyanosis or clubbing  Neuro: alert, non focal  Skin: Warm, no lesions or rashes    10/18/15 -  COMPARISON: Chest radiographs 0952 hours today. Chest CTA 06/15/2010.  FINDINGS: Chronic centrilobular emphysema. Curvilinear bilateral lower lobe opacity most resembles a combination of scarring and atelectasis, although there are some air bronchograms with peribronchial thickening in the posterior medial basal segment of the right lower lobe. Stable mild scarring in the lingula since 2011. No finding to correspond to the earlier radiographic nodule. No lung nodule. Major airways are patent aside from some retained secretions in the trachea.  No pleural effusion. No pericardial effusion. Calcified aortic and coronary artery atherosclerosis.  No thoracic lymphadenopathy.  Negative thoracic inlet. Chronic hepatic steatosis. Otherwise negative visualized liver, spleen, pancreas, adrenal glands, kidneys, and bowel in the upper abdomen.  No acute osseous abnormality identified.  IMPRESSION: 1. Negative for pulmonary nodule. Suspect artifact in the left upper lobe on the chest radiograph from earlier today. 2. Emphysema. Small volume retained secretions in the trachea and bilateral lower lobe opacity greater on the right suspicious for acute infectious exacerbation (bronchopneumonia) superimposed on chronic lung scarring. No pleural effusion. No thoracic lymphadenopathy. 3.  Calcified aortic  and coronary artery atherosclerosis.       Assessment & Plan:  COPD (chronic obstructive pulmonary disease) Pulmonary function testing today confirms severe obstructive lung disease without bronchodilator response. He is currently using Symbicort but only once a day. I believe that this is a good opportunity for [place] to change him to LABA/LAMA. We will try changing to Stiolto 2 puffs once a day to see if he benefits from it. If so we will continue it.   Baltazar Apo, MD, PhD 02/27/2016, 4:42 PM [facility] [phone] or if no answer [phone]

## 2016-02-27 NOTE — Addendum Note (Signed)
Addended by: Desmond Dike C on: 02/27/2016 04:46 PM   Modules accepted: Orders

## 2016-05-01 ENCOUNTER — Other Ambulatory Visit: Payer: Self-pay | Admitting: Family Medicine

## 2016-05-01 DIAGNOSIS — J439 Emphysema, unspecified: Secondary | ICD-10-CM

## 2016-05-07 ENCOUNTER — Encounter: Payer: Self-pay | Admitting: Family Medicine

## 2016-05-07 ENCOUNTER — Ambulatory Visit (INDEPENDENT_AMBULATORY_CARE_PROVIDER_SITE_OTHER): Payer: BLUE CROSS/BLUE SHIELD | Admitting: Family Medicine

## 2016-05-07 VITALS — BP 136/82 | HR 59 | Temp 96.8°F | Ht 74.0 in | Wt 288.0 lb

## 2016-05-07 DIAGNOSIS — Z91018 Allergy to other foods: Secondary | ICD-10-CM | POA: Insufficient documentation

## 2016-05-07 DIAGNOSIS — M5442 Lumbago with sciatica, left side: Secondary | ICD-10-CM | POA: Diagnosis not present

## 2016-05-07 DIAGNOSIS — G8929 Other chronic pain: Secondary | ICD-10-CM | POA: Diagnosis not present

## 2016-05-07 DIAGNOSIS — J439 Emphysema, unspecified: Secondary | ICD-10-CM | POA: Diagnosis not present

## 2016-05-07 DIAGNOSIS — Z23 Encounter for immunization: Secondary | ICD-10-CM

## 2016-05-07 DIAGNOSIS — E785 Hyperlipidemia, unspecified: Secondary | ICD-10-CM

## 2016-05-07 MED ORDER — HYDROCODONE-ACETAMINOPHEN 5-325 MG PO TABS: 1.0000 | ORAL_TABLET | Freq: Four times a day (QID) | ORAL | 0 refills | Status: DC | PRN

## 2016-05-07 MED ORDER — BUDESONIDE-FORMOTEROL FUMARATE 160-4.5 MCG/ACT IN AERO: 2.0000 | INHALATION_SPRAY | Freq: Two times a day (BID) | RESPIRATORY_TRACT | 11 refills | Status: DC

## 2016-05-07 NOTE — Patient Instructions (Signed)
Great to see you!  Come back in 4 months unless you need us sooner.    

## 2016-05-07 NOTE — Progress Notes (Signed)
   HPI  Patient presents today to follow-up for chronic medical conditions and back pain.  Left-sided low back pain with sciatica, interittently bothers him, sometimes he goes up to 2 months with no problems, however when his back pain is most severe he uses 1 hydrocodone to ease the pain with good relief.  He has used 30 pills in the last 1 year.  Hyperlipidemia Previously had severe myalgias with statins.  Meat allergy for follow-up if he has other lab work. He also follows up with allergy and immunology.  Pulmonary emphysema He transitioned to stiolto for 2 months then went back to symbicort due to increased dyspnea and rescue inhaler use.    PMH: Smoking status noted ROS: Per HPI  Objective: BP 136/82   Pulse (!) 59   Temp (!) 96.8 F (36 C) (Oral)   Ht '6\' 2"'$  (1.88 m)   Wt 288 lb (130.6 kg)   BMI 36.98 kg/m  Gen: NAD, alert, cooperative with exam HEENT: NCAT,  CV: RRR, good S1/S2, no murmur Resp: CTABL, no wheezes, non-labored Ext: No edema, warm Neuro: Alert and oriented, negative modified sraigh leg raise, 2+ patellar tendon reflexes bilaterally, strength 5/5 and sensation intact lower extremities  MSK No TTP of Low back    Assessment and plan:  #chronic low back pain, with sciatica stable Very intermittent use, he has used 30 pills over the last 1 year. refilled  # Pulmonary emphysema Stable, Felt that symbicort worked better for him than stioilto  # HLD LDL goal less than 54 given CAD with stenting Recheck Failed lipitor, crestor due to myalgias Consider PSK-9  # Meat allergy Diet controlled Alpha gal  prevnar and flu vaccine given- counseling provided  Orders Placed This Encounter  Procedures  . Alpha-Gal Panel  . Lipid panel  . CBC with Differential/Platelet  . CMP14+EGFR  . TSH    Meds ordered this encounter  Medications  . budesonide-formoterol (SYMBICORT) 160-4.5 MCG/ACT inhaler    Sig: Inhale 2 puffs into the lungs 2 (two) times  daily.    Dispense:  10.2 g    Refill:  11  . HYDROcodone-acetaminophen (NORCO) 5-325 MG tablet    Sig: Take 1 tablet by mouth every 6 (six) hours as needed for moderate pain.    Dispense:  30 tablet    Refill:  0    Laroy Apple, MD [facility] 05/07/2016, 8:20 AM

## 2016-05-07 NOTE — Addendum Note (Signed)
Addended by: Nigel Berthold C on: 05/07/2016 08:54 AM   Modules accepted: Orders

## 2016-05-10 LAB — LIPID PANEL
CHOL/HDL RATIO: 6.8 ratio — AB (ref 0.0–5.0)
Cholesterol, Total: 216 mg/dL — ABNORMAL HIGH (ref 100–199)
HDL: 32 mg/dL — ABNORMAL LOW (ref >39)
LDL CALC: 166 mg/dL — AB (ref 0–99)
Triglycerides: 88 mg/dL (ref 0–149)
VLDL CHOLESTEROL CAL: 18 mg/dL (ref 5–40)

## 2016-05-10 LAB — ALPHA-GAL PANEL
Alpha Gal IgE*: 12.3 kU/L — ABNORMAL HIGH (ref <0.35)
BEEF (BOS SPP) IGE: 2.13 kU/L — AB (ref <0.35)
BEEF CLASS INTERPRETATION: 2
Class Interpretation: 1
LAMB/MUTTON (OVIS SPP) IGE: 0.33 kU/L (ref <0.35)
PORK (SUS SPP) IGE: 0.52 kU/L — AB (ref <0.35)

## 2016-05-10 LAB — CBC WITH DIFFERENTIAL/PLATELET
BASOS ABS: 0.1 10*3/uL (ref 0.0–0.2)
BASOS: 1 %
EOS (ABSOLUTE): 0.2 10*3/uL (ref 0.0–0.4)
Eos: 3 %
Hematocrit: 48.5 % (ref 37.5–51.0)
Hemoglobin: 15.7 g/dL (ref 12.6–17.7)
Immature Grans (Abs): 0 10*3/uL (ref 0.0–0.1)
Immature Granulocytes: 0 %
LYMPHS: 27 %
Lymphocytes Absolute: 2.1 10*3/uL (ref 0.7–3.1)
MCH: 30.7 pg (ref 26.6–33.0)
MCHC: 32.4 g/dL (ref 31.5–35.7)
MCV: 95 fL (ref 79–97)
MONOS ABS: 1 10*3/uL — AB (ref 0.1–0.9)
Monocytes: 12 %
NEUTROS PCT: 57 %
Neutrophils Absolute: 4.5 10*3/uL (ref 1.4–7.0)
PLATELETS: 294 10*3/uL (ref 150–379)
RBC: 5.11 x10E6/uL (ref 4.14–5.80)
RDW: 14 % (ref 12.3–15.4)
WBC: 7.8 10*3/uL (ref 3.4–10.8)

## 2016-05-10 LAB — TSH: TSH: 3.12 u[IU]/mL (ref 0.450–4.500)

## 2016-05-10 LAB — CMP14+EGFR
A/G RATIO: 1.9 (ref 1.2–2.2)
ALT: 96 IU/L — AB (ref 0–44)
AST: 51 IU/L — AB (ref 0–40)
Albumin: 4.3 g/dL (ref 3.5–5.5)
Alkaline Phosphatase: 52 IU/L (ref 39–117)
BILIRUBIN TOTAL: 0.3 mg/dL (ref 0.0–1.2)
BUN/Creatinine Ratio: 11 (ref 9–20)
BUN: 9 mg/dL (ref 6–24)
CALCIUM: 9.8 mg/dL (ref 8.7–10.2)
CHLORIDE: 103 mmol/L (ref 96–106)
CO2: 24 mmol/L (ref 18–29)
Creatinine, Ser: 0.81 mg/dL (ref 0.76–1.27)
GFR calc Af Amer: 119 mL/min/{1.73_m2} (ref >59)
GFR, EST NON AFRICAN AMERICAN: 103 mL/min/{1.73_m2} (ref >59)
GLUCOSE: 139 mg/dL — AB (ref 65–99)
Globulin, Total: 2.3 g/dL (ref 1.5–4.5)
POTASSIUM: 5.1 mmol/L (ref 3.5–5.2)
Sodium: 143 mmol/L (ref 134–144)
Total Protein: 6.6 g/dL (ref 6.0–8.5)

## 2016-05-11 LAB — HGB A1C W/O EAG: HEMOGLOBIN A1C: 6.8 % — AB (ref 4.8–5.6)

## 2016-05-11 LAB — SPECIMEN STATUS REPORT

## 2016-05-13 ENCOUNTER — Telehealth: Payer: Self-pay | Admitting: Family Medicine

## 2016-05-13 DIAGNOSIS — E119 Type 2 diabetes mellitus without complications: Secondary | ICD-10-CM | POA: Insufficient documentation

## 2016-05-13 NOTE — Telephone Encounter (Signed)
Called and discussed new Dx of DM2 Diet controled so really just needs education.   Needs discussion about PCSK-9 inhibitor as well given Hx of MI X 2 and lipitor/crestor failure. My do well with livalo.   Laroy Apple, MD [facility] 05/13/2016, 2:17 PM

## 2016-05-14 ENCOUNTER — Encounter: Payer: Self-pay | Admitting: Pharmacist

## 2016-05-14 ENCOUNTER — Ambulatory Visit (INDEPENDENT_AMBULATORY_CARE_PROVIDER_SITE_OTHER): Payer: BLUE CROSS/BLUE SHIELD | Admitting: Pharmacist

## 2016-05-14 VITALS — BP 136/80 | HR 78 | Ht 74.0 in | Wt 287.0 lb

## 2016-05-14 DIAGNOSIS — E782 Mixed hyperlipidemia: Secondary | ICD-10-CM

## 2016-05-14 DIAGNOSIS — E119 Type 2 diabetes mellitus without complications: Secondary | ICD-10-CM

## 2016-05-14 DIAGNOSIS — E6609 Other obesity due to excess calories: Secondary | ICD-10-CM

## 2016-05-14 DIAGNOSIS — IMO0001 Reserved for inherently not codable concepts without codable children: Secondary | ICD-10-CM

## 2016-05-14 DIAGNOSIS — Z6837 Body mass index (BMI) 37.0-37.9, adult: Secondary | ICD-10-CM | POA: Diagnosis not present

## 2016-05-14 MED ORDER — GLUCOSE BLOOD VI STRP: ORAL_STRIP | 2 refills | Status: DC

## 2016-05-14 MED ORDER — PITAVASTATIN CALCIUM 4 MG PO TABS: 4.0000 mg | ORAL_TABLET | Freq: Every day | ORAL | 2 refills | Status: DC

## 2016-05-14 MED ORDER — ONETOUCH DELICA LANCETS 33G MISC: 2 refills | Status: DC

## 2016-05-14 NOTE — Patient Instructions (Signed)
Diabetes and Standards of Medical Care   Diabetes is complicated. You may find that your diabetes team includes a dietitian, nurse, diabetes educator, eye doctor, and more. To help everyone know what is going on and to help you get the care you deserve, the following schedule of care was developed to help keep you on track. Below are the tests, exams, vaccines, medicines, education, and plans you will need.  Blood Glucose Goals Prior to meals = 80 - 130 Within 2 hours of the start of a meal = less than 180  HbA1c test (goal is less than 6.5% - your last value was 6.8%) This test shows how well you have controlled your glucose over the past 2 to 3 months. It is used to see if your diabetes management plan needs to be adjusted.   It is performed at least 2 times a year if you are meeting treatment goals.  It is performed 4 times a year if therapy has changed or if you are not meeting treatment goals.  Blood pressure test  This test is performed at every routine medical visit. The goal is less than 140/90 mmHg for most people, but 130/80 mmHg in some cases. Ask your health care provider about your goal.  Dental exam  Follow up with the dentist regularly.  Eye exam  If you are diagnosed with type 1 diabetes as a child, get an exam upon reaching the age of 10 years or older and have had diabetes for 3 to 5 years. Yearly eye exams are recommended after that initial eye exam.  If you are diagnosed with type 1 diabetes as an adult, get an exam within 5 years of diagnosis and then yearly.  If you are diagnosed with type 2 diabetes, get an exam as soon as possible after the diagnosis and then yearly.  Foot care exam  Visual foot exams are performed at every routine medical visit. The exams check for cuts, injuries, or other problems with the feet.  A comprehensive foot exam should be done yearly. This includes visual inspection as well as assessing foot pulses and testing for loss of  sensation.  Check your feet nightly for cuts, injuries, or other problems with your feet. Tell your health care provider if anything is not healing.  Kidney function test (urine microalbumin)  This test is performed once a year.  Type 1 diabetes: The first test is performed 5 years after diagnosis.  Type 2 diabetes: The first test is performed at the time of diagnosis.  A serum creatinine and estimated glomerular filtration rate (eGFR) test is done once a year to assess the level of chronic kidney disease (CKD), if present.  Lipid profile (cholesterol, HDL, LDL, triglycerides)  Performed every 5 years for most people.  The goal for LDL is less than 100 mg/dL. If you are at high risk, the goal is less than 70 mg/dL.  The goal for HDL is 40 mg/dL to 50 mg/dL for men and 50 mg/dL to 60 mg/dL for women. An HDL cholesterol of 60 mg/dL or higher gives some protection against heart disease.  The goal for triglycerides is less than 150 mg/dL.  Influenza vaccine, pneumococcal vaccine, and hepatitis B vaccine  The influenza vaccine is recommended yearly.  The pneumococcal vaccine is generally given once in a lifetime. However, there are some instances when another vaccination is recommended. Check with your health care provider.  The hepatitis B vaccine is also recommended for adults with diabetes.    Diabetes self-management education  Education is recommended at diagnosis and ongoing as needed.  Treatment plan  Your treatment plan is reviewed at every medical visit.  Document Released: 05/05/2009 Document Revised: 03/10/2013 Document Reviewed: 12/08/2012 [employer] Patient Information 2014 [employer].   

## 2016-05-17 NOTE — Progress Notes (Signed)
Patient ID: William Carson, male   DOB: 08-23-1965, 50 y.o.   MRN: [id number]   Subjective:    William Carson is a 50 y.o. male who presents for an initial evaluation of Type 2 diabetes mellitus.  Mr. Loduca was recently diagnosed with type 2 DM based on BG os 139 and A1c of 6.8%.  He has a brother with pre DM but no other family members with elevated BG / diabetes.  No recent meds such as oral corticosteroids that might have increased BG.  Mr. Obier also has hyperlipidemia and history of CAD / CVD.  He has taken rosuvastatin, atorvastatin and simvastatin.  He experienced myalgias with all of them.   Cardiovascular risk factors: diabetes mellitus, dyslipidemia, male gender, obesity (BMI >= 30 kg/m2) and sedentary lifestyle Current diabetic medications include none.   Eye exam current (within one year): no Weight trend: stable Prior visit with CDE: no Current diet: in general, an "unhealthy" diet - drinks Mt Dew (has decreased to 1 per day since diagnosis) Current exercise: none Medication Compliance?  Yes  Current monitoring regimen: none Home blood sugar records: none Any episodes of hypoglycemia? no  Is He on ACE inhibitor or angiotensin II receptor blocker?  No       The following portions of the patient's history were reviewed and updated as appropriate: allergies, current medications, past family history, past medical history, past social history, past surgical history and problem list.    Objective:    BP 136/80   Pulse 78   Ht 6\' 2"  (1.88 m)   Wt 287 lb (130.2 kg)   BMI 36.85 kg/m    A1c = 6.8%  Lab Review Glucose (mg/dL)  Date Value  05/07/2016 139 (H)  10/31/2015 118 (H)   Glucose, Bld (mg/dL)  Date Value  10/21/2015 96  10/20/2015 102 (H)  10/18/2015 113 (H)   CO2 (mmol/L)  Date Value  05/07/2016 24  10/31/2015 19  10/21/2015 26   BUN (mg/dL)  Date Value  05/07/2016 9  10/31/2015 10  10/21/2015 10  10/20/2015 9  10/18/2015 8  04/12/2015 12    Creatinine, Ser (mg/dL)  Date Value  05/07/2016 0.81  10/31/2015 0.98  10/21/2015 0.79   Assessment:      1. Diabetes Mellitus type II, newly diagnosed - would like to see A1c 6.5% or less.  2. Obesity - patient motivated to decreased weight to control BG and for better health 3. Hyperlipidemia - with CVD and DM goal LDL is less than 70 - patient should be on statin but has been intolerant of 3 tried in past.    Plan:    1.  Rx changes: start Livalo 4mg  - take 1 tablet daily 2.  Education: Reviewed 'ABCs' of diabetes management (respective goals in parentheses):  A1C (<7), blood pressure (<130/80), and cholesterol (LDL <100). 3. Discussed pathophysiology of DM; difference between type 1 and type 2 DM. Discussed complications of uncontrolled DM and prevention 4. CHO counting / low calorie diet discussed.  Reviewed CHO amount in various foods and how to read nutrition labels.  Patient to focus on substituting non sugar beverages for soda, decreasing serving sizes of high CHO  / calorie foods and increasing non starchy vegetables.  5.  Recommend check BG 1-2 times a day.  Patient was given glucometer in office today 6.  Recommended increase physical activity - goal is 150 minutes per week 7. Follow up: 2 months

## 2016-05-31 ENCOUNTER — Ambulatory Visit (HOSPITAL_COMMUNITY)
Admission: RE | Admit: 2016-05-31 | Discharge: 2016-05-31 | Disposition: A | Discharge status: Home / Self Care | Payer: BLUE CROSS/BLUE SHIELD | Source: Ambulatory Visit | Attending: Nurse Practitioner | Admitting: Nurse Practitioner

## 2016-05-31 ENCOUNTER — Ambulatory Visit (INDEPENDENT_AMBULATORY_CARE_PROVIDER_SITE_OTHER): Payer: BLUE CROSS/BLUE SHIELD | Admitting: Nurse Practitioner

## 2016-05-31 ENCOUNTER — Ambulatory Visit (INDEPENDENT_AMBULATORY_CARE_PROVIDER_SITE_OTHER): Payer: BLUE CROSS/BLUE SHIELD

## 2016-05-31 ENCOUNTER — Other Ambulatory Visit: Payer: Self-pay | Admitting: Nurse Practitioner

## 2016-05-31 ENCOUNTER — Encounter: Payer: Self-pay | Admitting: Nurse Practitioner

## 2016-05-31 VITALS — BP 125/78 | HR 82 | Temp 97.6°F | Ht 74.0 in | Wt 285.0 lb

## 2016-05-31 DIAGNOSIS — R102 Pelvic and perineal pain: Secondary | ICD-10-CM | POA: Insufficient documentation

## 2016-05-31 DIAGNOSIS — I700 Atherosclerosis of aorta: Secondary | ICD-10-CM | POA: Insufficient documentation

## 2016-05-31 DIAGNOSIS — K5732 Diverticulitis of large intestine without perforation or abscess without bleeding: Secondary | ICD-10-CM

## 2016-05-31 DIAGNOSIS — K760 Fatty (change of) liver, not elsewhere classified: Secondary | ICD-10-CM | POA: Insufficient documentation

## 2016-05-31 DIAGNOSIS — N281 Cyst of kidney, acquired: Secondary | ICD-10-CM | POA: Insufficient documentation

## 2016-05-31 DIAGNOSIS — J9811 Atelectasis: Secondary | ICD-10-CM | POA: Diagnosis not present

## 2016-05-31 DIAGNOSIS — R1915 Other abnormal bowel sounds: Secondary | ICD-10-CM

## 2016-05-31 LAB — URINALYSIS, COMPLETE
Bilirubin, UA: NEGATIVE
GLUCOSE, UA: NEGATIVE
LEUKOCYTES UA: NEGATIVE
NITRITE UA: NEGATIVE
Protein, UA: NEGATIVE
RBC, UA: NEGATIVE
SPEC GRAV UA: 1.03 (ref 1.005–1.030)
Urobilinogen, Ur: 0.2 mg/dL (ref 0.2–1.0)
pH, UA: 6 (ref 5.0–7.5)

## 2016-05-31 LAB — MICROSCOPIC EXAMINATION
Bacteria, UA: NONE SEEN
Epithelial Cells (non renal): NONE SEEN /hpf (ref 0–10)
RBC MICROSCOPIC, UA: NONE SEEN /HPF (ref 0–?)
WBC, UA: NONE SEEN /hpf (ref 0–?)

## 2016-05-31 MED ORDER — METRONIDAZOLE 500 MG PO TABS: 500.0000 mg | ORAL_TABLET | Freq: Two times a day (BID) | ORAL | 0 refills | Status: DC

## 2016-05-31 MED ORDER — IOPAMIDOL (ISOVUE-300) INJECTION 61%
100.0000 mL | Freq: Once | INTRAVENOUS | Status: AC | PRN
  Administered 2016-05-31: 100 mL via INTRAVENOUS

## 2016-05-31 MED ORDER — CIPROFLOXACIN HCL 500 MG PO TABS: 500.0000 mg | ORAL_TABLET | Freq: Two times a day (BID) | ORAL | 0 refills | Status: DC

## 2016-05-31 NOTE — Progress Notes (Signed)
   Subjective:    Patient ID: William Carson, male    DOB: 1966-04-21, 50 y.o.   MRN: [id number]  HPI  Patient comes in today c/o severs abdominal pain.Started yesterday evening when he got home form work- feels like someone hit him with Statistician". Pain has gradually gotten worse. Rates pain 6/10. Keep waist straight helps with pain. Bending over increases pain. Denies nausea and vomiting, no diarrhea or constipation. denies trouble voiding.   Review of Systems  Constitutional: Negative.   HENT: Negative.   Respiratory: Negative.   Cardiovascular: Negative.   Gastrointestinal: Positive for abdominal pain. Negative for constipation, diarrhea, nausea and vomiting.  Genitourinary: Negative for dysuria, frequency, hematuria and urgency.  Neurological: Negative.   Psychiatric/Behavioral: Negative.   All other systems reviewed and are negative.      Objective:   Physical Exam  Constitutional: He is oriented to person, place, and time. He appears well-developed and well-nourished. He appears distressed.  Cardiovascular: Normal rate.   Pulmonary/Chest: Effort normal and breath sounds normal.  Abdominal: Soft. He exhibits distension. He exhibits no mass. There is tenderness (all along suprpubic region). There is no rebound and no guarding.  Hypoactive bowel sounds  Neurological: He is alert and oriented to person, place, and time.  Skin: Skin is warm.  Psychiatric: He has a normal mood and affect. His behavior is normal. Judgment and thought content normal.   BP 125/78   Pulse 82   Temp 97.6 F (36.4 C) (Oral)   Ht 6\' 2"  (1.88 m)   Wt 285 lb (129.3 kg)   BMI 36.59 kg/m   Urine clear-   KUB- stool in upper colon-Preliminary reading by Ronnald Collum, FNP  [facility]       Assessment & Plan:  1. Suprapubic pain/hypoactive bowel sounds NPO until tests are done Will call with test results - Urinalysis, Complete - DG Abd 1 View; Future - CT Abdomen Pelvis W Contrast; Future *  verbally consulted with Dr. Abbie Sons, FNP

## 2016-05-31 NOTE — Patient Instructions (Signed)

## 2016-06-19 ENCOUNTER — Encounter: Payer: Self-pay | Admitting: Pharmacist

## 2016-06-19 ENCOUNTER — Ambulatory Visit (INDEPENDENT_AMBULATORY_CARE_PROVIDER_SITE_OTHER): Payer: BLUE CROSS/BLUE SHIELD | Admitting: Pharmacist

## 2016-06-19 VITALS — BP 130/80 | HR 68 | Ht 74.0 in | Wt 285.0 lb

## 2016-06-19 DIAGNOSIS — E119 Type 2 diabetes mellitus without complications: Secondary | ICD-10-CM

## 2016-06-19 DIAGNOSIS — E785 Hyperlipidemia, unspecified: Secondary | ICD-10-CM

## 2016-06-19 NOTE — Progress Notes (Signed)
Patient ID: William Carson, male   DOB: Dec 15, 1965, 50 y.o.   MRN: [id number]    Subjective:    William Carson is a 50 y.o. male who presents for a follow up of Type 2 diabetes mellitus.  William Carson was diagnosed with type 2 DM 05/07/2016 with BG of 139 and A1c of 6.8%.  He is trying to control DM with TLC.   Diet: Over the last month he has made significant changes to his diet - following CHO counting diet.  He is eating 3 smaller meals daily and 1-2 snacks.  Has increased non starchy vegetables.  Decreased potato intake and no longer drinking regular sodas and only 1 Coke Zero per day.  His only exercise is work related.  He is checking BG daily.  Reviewed the memory in his glucometer today Avg BG = 124 Log showed the following BG readings - 121, 86, 124, 132, 145, 94, 102, 82, 76, 199, 156, 154, 133, 134  William Carson also has hyperlipidemia and history of CAD / CVD.  He has taken rosuvastatin, atorvastatin and simvastatin in the past and experienced myalgias with all of them. At our last visit she started Livalo 4mg  qd and has been tolerting well without significant changes in leg pain.  Eye exam current (within one year): no Weight trend: stable Prior visit with CDE: yes Current exercise: none Medication Compliance?  Yes    Objective:    BP 130/80   Pulse 68   Ht 6\' 2"  (1.88 m)   Wt 285 lb (129.3 kg)   BMI 36.59 kg/m    A1c = 6.8% (05/07/2016)  Lab Review Glucose (mg/dL)  Date Value  05/07/2016 139 (H)  10/31/2015 118 (H)   Glucose, Bld (mg/dL)  Date Value  10/21/2015 96  10/20/2015 102 (H)  10/18/2015 113 (H)   CO2 (mmol/L)  Date Value  05/07/2016 24  10/31/2015 19  10/21/2015 26   BUN (mg/dL)  Date Value  05/07/2016 9  10/31/2015 10  10/21/2015 10  10/20/2015 9  10/18/2015 8  04/12/2015 12   Creatinine, Ser (mg/dL)  Date Value  05/07/2016 0.81  10/31/2015 0.98  10/21/2015 0.79   Assessment:      1. Diabetes Mellitus type II, newly diagnosed  - doing well with dietary change 2. Obesity - patient motivated to decreased weight to control BG and for better health 3. Hyperlipidemia - with CVD and DM goal LDL is less than 70 - tolerating livalo    Plan:    1.  Rx changes: continue Livalo 4mg  - take 1 tablet daily 2.  Reviewed 'ABCs' of diabetes management (respective goals in parentheses):  A1C (<7), blood pressure (<130/80), and cholesterol (LDL <100). 3.  Urine microalbumin checked today - Pending.  Reminded to get eye exam 4. Continue CHO counting / low calorie diet discussed.  R5.  Recommend check BG 1-2 times a day.  Patient was given glucometer in office today 5.  Recommended increase physical activity - goal is 150 minutes per week 6. Follow up: 2 months with PCP as planned  Cherre Robins, PharmD, CPP, CDE

## 2016-06-19 NOTE — Patient Instructions (Signed)
Continue to limit serving sizes and carbohydrate intake.  Try to increase physical activity / walking - goal is 150 minutes per week.  Diabetes and Standards of Medical Care   Diabetes is complicated. You may find that your diabetes team includes a dietitian, nurse, diabetes educator, eye doctor, and more. To help everyone know what is going on and to help you get the care you deserve, the following schedule of care was developed to help keep you on track. Below are the tests, exams, vaccines, medicines, education, and plans you will need.  Blood Glucose Goals Prior to meals = 80 - 130 Within 2 hours of the start of a meal = less than 180  HbA1c test (goal is less than 6.5% - your last value was 6.8%) This test shows how well you have controlled your glucose over the past 2 to 3 months. It is used to see if your diabetes management plan needs to be adjusted.   It is performed at least 2 times a year if you are meeting treatment goals.  It is performed 4 times a year if therapy has changed or if you are not meeting treatment goals.  Blood pressure test  This test is performed at every routine medical visit. The goal is less than 140/90 mmHg for most people, but 130/80 mmHg in some cases. Ask your health care provider about your goal.  Dental exam  Follow up with the dentist regularly.  Eye exam  If you are diagnosed with type 1 diabetes as a child, get an exam upon reaching the age of 92 years or older and have had diabetes for 3 to 5 years. Yearly eye exams are recommended after that initial eye exam.  If you are diagnosed with type 1 diabetes as an adult, get an exam within 5 years of diagnosis and then yearly.  If you are diagnosed with type 2 diabetes, get an exam as soon as possible after the diagnosis and then yearly.  Foot care exam  Visual foot exams are performed at every routine medical visit. The exams check for cuts, injuries, or other problems with the feet.  A  comprehensive foot exam should be done yearly. This includes visual inspection as well as assessing foot pulses and testing for loss of sensation.  Check your feet nightly for cuts, injuries, or other problems with your feet. Tell your health care provider if anything is not healing.  Kidney function test (urine microalbumin)  This test is performed once a year.  Type 1 diabetes: The first test is performed 5 years after diagnosis.  Type 2 diabetes: The first test is performed at the time of diagnosis.  A serum creatinine and estimated glomerular filtration rate (eGFR) test is done once a year to assess the level of chronic kidney disease (CKD), if present.  Lipid profile (cholesterol, HDL, LDL, triglycerides)  Performed every 5 years for most people.  The goal for LDL is less than 100 mg/dL. If you are at high risk, the goal is less than 70 mg/dL.  The goal for HDL is 40 mg/dL to 50 mg/dL for men and 50 mg/dL to 60 mg/dL for women. An HDL cholesterol of 60 mg/dL or higher gives some protection against heart disease.  The goal for triglycerides is less than 150 mg/dL.  Influenza vaccine, pneumococcal vaccine, and hepatitis B vaccine  The influenza vaccine is recommended yearly.  The pneumococcal vaccine is generally given once in a lifetime. However, there are some instances  when another vaccination is recommended. Check with your health care provider.  The hepatitis B vaccine is also recommended for adults with diabetes.  Diabetes self-management education  Education is recommended at diagnosis and ongoing as needed.  Treatment plan  Your treatment plan is reviewed at every medical visit.  Document Released: 05/05/2009 Document Revised: 03/10/2013 Document Reviewed: 12/08/2012 [facility] Patient Information 2014 [employer].

## 2016-06-20 LAB — MICROALBUMIN / CREATININE URINE RATIO
CREATININE, UR: 144.3 mg/dL
Microalb/Creat Ratio: <2.1 mg/g creat (ref 0.0–30.0)

## 2016-07-03 ENCOUNTER — Encounter: Payer: Self-pay | Admitting: Emergency Medicine

## 2016-07-03 ENCOUNTER — Ambulatory Visit (INDEPENDENT_AMBULATORY_CARE_PROVIDER_SITE_OTHER): Payer: BLUE CROSS/BLUE SHIELD | Admitting: Emergency Medicine

## 2016-07-03 DIAGNOSIS — J438 Other emphysema: Secondary | ICD-10-CM | POA: Diagnosis not present

## 2016-07-03 NOTE — Progress Notes (Signed)
Subjective:    Patient ID: William Carson, male    DOB: [birth year], 50 y.o.   MRN: [id number]  HPI 50 yo man, history tobacco use (45-pack-years), coronary artery disease, hyperlipidemia. He carries a diagnosis of COPD that was made about 4-5 years ago, was started on Symbicort a couple years ago (was only using qd). He was treated for influenza compensated by bilateral Community acquired pneumonia 3/31 - 4/2. He continues to have exertional dyspnea, worse since this hospitalization. He has cough, happens every day, prod of colored mucous. He does not wheeze. He quit smoking end of March.   ROV 02/27/16 -- Follow-up visit for history of tobacco use, COPD and recent hospitalization for bilateral pneumonias. He underwent pulmonary function testing Today and have reviewed results. This consistent with severe obstructive lung disease without bronchodilator response, possible superimposed restriction but evidence for hyperinflation on lung volumes, increased diffusion capacity. He is currently managed on Symbicort, only taking in the am. His bfreathing does sometimes limit him, heavy exertion. Minimal cough.   ROV 12/13 /17 -- This is a follow-up visit for history of COPD. Pulmonary function testing with severe obstructive disease. At his last visit we tried changing Symbicort to Stiolto to see if he would tolerate. He felt that the stiolto was not as good so he went back to symbicort. He does have some exertional SOB. Uses albuterol very rarely. No flares since last time. Flu shot up to date.     Review of Systems  Constitutional: Negative for fever and unexpected weight change.  HENT: Negative for congestion, dental problem, ear pain, nosebleeds, postnasal drip, rhinorrhea, sinus pressure, sneezing, sore throat and trouble swallowing.   Eyes: Negative for redness and itching.  Respiratory: Positive for shortness of breath. Negative for chest tightness and wheezing.   Cardiovascular: Negative for  palpitations and leg swelling.  Gastrointestinal: Negative for nausea and vomiting.  Genitourinary: Negative for dysuria.  Musculoskeletal: Negative for joint swelling.  Skin: Negative for rash.  Neurological: Negative for headaches.  Hematological: Does not bruise/bleed easily.  Psychiatric/Behavioral: Negative for dysphoric mood. The patient is not nervous/anxious.        Objective:   Physical Exam  Vitals:   07/03/16 1404  BP: 120/76  Pulse: 68  SpO2: 96%  Weight: 287 lb (130.2 kg)  Height: 6\' 2"  (1.88 m)   Gen: Pleasant, obese man, in no distress,  normal affect  ENT: No lesions,  mouth clear,  oropharynx clear, no postnasal drip  Neck: No JVD, no TMG, no carotid bruits  Lungs: No use of accessory muscles, distant, clear without rales or rhonchi  Cardiovascular: RRR, heart sounds normal, no murmur or gallops, no peripheral edema  Musculoskeletal: No deformities, no cyanosis or clubbing  Neuro: alert, non focal  Skin: Warm, no lesions or rashes    10/18/15 -  COMPARISON: Chest radiographs 0952 hours today. Chest CTA 06/15/2010.  FINDINGS: Chronic centrilobular emphysema. Curvilinear bilateral lower lobe opacity most resembles a combination of scarring and atelectasis, although there are some air bronchograms with peribronchial thickening in the posterior medial basal segment of the right lower lobe. Stable mild scarring in the lingula since 2011. No finding to correspond to the earlier radiographic nodule. No lung nodule. Major airways are patent aside from some retained secretions in the trachea.  No pleural effusion. No pericardial effusion. Calcified aortic and coronary artery atherosclerosis.  No thoracic lymphadenopathy.  Negative thoracic inlet. Chronic hepatic steatosis. Otherwise negative visualized liver, spleen, pancreas, adrenal glands, kidneys,  and bowel in the upper abdomen.  No acute osseous abnormality  identified.  IMPRESSION: 1. Negative for pulmonary nodule. Suspect artifact in the left upper lobe on the chest radiograph from earlier today. 2. Emphysema. Small volume retained secretions in the trachea and bilateral lower lobe opacity greater on the right suspicious for acute infectious exacerbation (bronchopneumonia) superimposed on chronic lung scarring. No pleural effusion. No thoracic lymphadenopathy. 3. Calcified aortic and coronary artery atherosclerosis.       Assessment & Plan:  COPD (chronic obstructive pulmonary disease) We will try increasing your Symbicort to 2 puffs twice a day for 6 weeks to see if you benefit. Based on how you are doing, we will either continue this dose or go back to once a day.  Flu shot up to date.  Take albuterol 2 puffs up to every 4 hours if needed for shortness of breath.  Follow with Dr Lamonte Sakai in 6 months or sooner if you have any problems  Baltazar Apo, MD, PhD 07/03/2016, 2:35 PM [facility] [phone] or if no answer [phone]

## 2016-07-03 NOTE — Assessment & Plan Note (Signed)
We will try increasing your Symbicort to 2 puffs twice a day for 6 weeks to see if you benefit. Based on how you are doing, we will either continue this dose or go back to once a day.  Flu shot up to date.  Take albuterol 2 puffs up to every 4 hours if needed for shortness of breath.  Follow with Dr Lamonte Sakai in 6 months or sooner if you have any problems

## 2016-07-03 NOTE — Patient Instructions (Signed)
We will try increasing your Symbicort to 2 puffs twice a day for 6 weeks to see if you benefit. Based on how you are doing, we will either continue this dose or go back to once a day.  Flu shot up to date.  Take albuterol 2 puffs up to every 4 hours if needed for shortness of breath.  Follow with Dr Lamonte Sakai in 6 months or sooner if you have any problems

## 2016-08-23 LAB — HM DIABETES EYE EXAM

## 2016-08-27 DIAGNOSIS — R21 Rash and other nonspecific skin eruption: Secondary | ICD-10-CM | POA: Diagnosis not present

## 2016-08-27 DIAGNOSIS — J3089 Other allergic rhinitis: Secondary | ICD-10-CM | POA: Diagnosis not present

## 2016-08-27 DIAGNOSIS — Z91018 Allergy to other foods: Secondary | ICD-10-CM | POA: Diagnosis not present

## 2016-08-27 DIAGNOSIS — R0602 Shortness of breath: Secondary | ICD-10-CM | POA: Diagnosis not present

## 2016-08-29 ENCOUNTER — Other Ambulatory Visit: Payer: Self-pay | Admitting: Cardiology

## 2016-08-29 NOTE — Telephone Encounter (Signed)
Rx(s) sent to pharmacy electronically.  

## 2016-09-09 ENCOUNTER — Ambulatory Visit (INDEPENDENT_AMBULATORY_CARE_PROVIDER_SITE_OTHER): Payer: BLUE CROSS/BLUE SHIELD | Admitting: Family Medicine

## 2016-09-09 ENCOUNTER — Encounter: Payer: Self-pay | Admitting: Family Medicine

## 2016-09-09 VITALS — BP 117/72 | HR 62 | Temp 97.0°F | Ht 74.0 in | Wt 283.4 lb

## 2016-09-09 DIAGNOSIS — E119 Type 2 diabetes mellitus without complications: Secondary | ICD-10-CM

## 2016-09-09 DIAGNOSIS — E785 Hyperlipidemia, unspecified: Secondary | ICD-10-CM | POA: Diagnosis not present

## 2016-09-09 DIAGNOSIS — E669 Obesity, unspecified: Secondary | ICD-10-CM | POA: Diagnosis not present

## 2016-09-09 DIAGNOSIS — IMO0001 Reserved for inherently not codable concepts without codable children: Secondary | ICD-10-CM

## 2016-09-09 DIAGNOSIS — J438 Other emphysema: Secondary | ICD-10-CM

## 2016-09-09 DIAGNOSIS — Z6836 Body mass index (BMI) 36.0-36.9, adult: Secondary | ICD-10-CM

## 2016-09-09 LAB — BAYER DCA HB A1C WAIVED: HB A1C: 6 % (ref <7.0)

## 2016-09-09 MED ORDER — METOPROLOL TARTRATE 25 MG PO TABS: 25.0000 mg | ORAL_TABLET | Freq: Two times a day (BID) | ORAL | 3 refills | Status: DC

## 2016-09-09 NOTE — Progress Notes (Signed)
   HPI  Patient presents today here to follow-up for chronic medical conditions.  Type 2 diabetes New diagnosis last fall, patient states that he has improved his diet quite a bit, he's occupationally active but does not have any regular exercise routine. No medications.  Hyperlipidemia Taking livalo every other day, was having myalgias with daily.   Overall he feels well and has no complaints.  He is breathing easily, only takes Symbicort once daily, we discussed this several times. Using albuterol only once every 1-2 months.  PMH: Smoking status noted ROS: Per HPI  Objective: BP 117/72   Pulse 62   Temp 97 F (36.1 C) (Oral)   Ht _0  (1.88 m)   Wt 283 lb 6.4 oz (128.5 kg)   BMI 36.39 kg/m  Gen: NAD, alert, cooperative with exam HEENT: NCAT CV: RRR, good S1/S2, no murmur Resp: CTABL, no wheezes, non-labored Ext: No edema, warm Neuro: Alert and oriented  Diabetic Foot Exam - Simple   Simple Foot Form Diabetic Foot exam was performed with the following findings:  Yes 09/09/2016  8:44 AM  Visual Inspection No deformities, no ulcerations, no other skin breakdown bilaterally:  Yes Sensation Testing Intact to touch and monofilament testing bilaterally:  Yes Pulse Check Posterior Tibialis and Dorsalis pulse intact bilaterally:  Yes Comments      Eye exam at [facility] recently    Assessment and plan:  # Type 2 diabetes Clinically stable, A1c pending Foot exam today Eye exam recently performed, unsure if this was ophthalmology. Urine micro-is up-to-date. Diet Controlled, no medications   # COPD Well controlled on Symbicort, discussed good compliance.  # Hyperlipidemia Repeat labs Patient currently taking pitavastatin   # Obesity Slightly improved, patient making good diet choices, discussed exercise   Needs Cardiology follow-up, emphasized this. No CP   Orders Placed This Encounter  Procedures  . Bayer DCA Hb A1c Waived  . Lipid panel  .  CMP14+EGFR  . CBC with Differential/Platelet    Meds ordered this encounter  Medications  . metoprolol tartrate (LOPRESSOR) 25 MG tablet    Sig: Take 1 tablet (25 mg total) by mouth 2 (two) times daily. <PLEASE MAKE APPOINTMENT FOR REFILLS>    Dispense:  180 tablet    Refill:  3    Laroy Apple, MD [facility] 09/09/2016, 8:47 AM

## 2016-09-09 NOTE — Patient Instructions (Signed)
Great to see you!  Lets follow up in 3-4 months for diabetes.

## 2016-09-10 LAB — CBC WITH DIFFERENTIAL/PLATELET
Basophils Absolute: 0.1 10*3/uL (ref 0.0–0.2)
Basos: 1 %
EOS (ABSOLUTE): 0.2 10*3/uL (ref 0.0–0.4)
Eos: 2 %
Hematocrit: 47.8 % (ref 37.5–51.0)
Hemoglobin: 15.5 g/dL (ref 13.0–17.7)
Immature Grans (Abs): 0 10*3/uL (ref 0.0–0.1)
Immature Granulocytes: 0 %
Lymphocytes Absolute: 2 10*3/uL (ref 0.7–3.1)
Lymphs: 24 %
MCH: 29.8 pg (ref 26.6–33.0)
MCHC: 32.4 g/dL (ref 31.5–35.7)
MCV: 92 fL (ref 79–97)
Monocytes Absolute: 0.9 10*3/uL (ref 0.1–0.9)
Monocytes: 10 %
Neutrophils Absolute: 5.1 10*3/uL (ref 1.4–7.0)
Neutrophils: 63 %
Platelets: 281 10*3/uL (ref 150–379)
RBC: 5.2 x10E6/uL (ref 4.14–5.80)
RDW: 15.1 % (ref 12.3–15.4)
WBC: 8.2 10*3/uL (ref 3.4–10.8)

## 2016-09-10 LAB — CMP14+EGFR
A/G RATIO: 2 (ref 1.2–2.2)
ALK PHOS: 45 IU/L (ref 39–117)
ALT: 43 IU/L (ref 0–44)
AST: 24 IU/L (ref 0–40)
Albumin: 4.6 g/dL (ref 3.5–5.5)
BUN/Creatinine Ratio: 15 (ref 9–20)
BUN: 12 mg/dL (ref 6–24)
Bilirubin Total: 0.5 mg/dL (ref 0.0–1.2)
CO2: 22 mmol/L (ref 18–29)
CREATININE: 0.8 mg/dL (ref 0.76–1.27)
Calcium: 9.5 mg/dL (ref 8.7–10.2)
Chloride: 103 mmol/L (ref 96–106)
GFR calc Af Amer: 120 mL/min/{1.73_m2} (ref >59)
GFR calc non Af Amer: 103 mL/min/{1.73_m2} (ref >59)
GLOBULIN, TOTAL: 2.3 g/dL (ref 1.5–4.5)
Glucose: 125 mg/dL — ABNORMAL HIGH (ref 65–99)
POTASSIUM: 4.9 mmol/L (ref 3.5–5.2)
SODIUM: 141 mmol/L (ref 134–144)
Total Protein: 6.9 g/dL (ref 6.0–8.5)

## 2016-09-10 LAB — LIPID PANEL
CHOLESTEROL TOTAL: 159 mg/dL (ref 100–199)
Chol/HDL Ratio: 4.7 ratio units (ref 0.0–5.0)
HDL: 34 mg/dL — ABNORMAL LOW (ref >39)
LDL CALC: 107 mg/dL — AB (ref 0–99)
Triglycerides: 91 mg/dL (ref 0–149)
VLDL CHOLESTEROL CAL: 18 mg/dL (ref 5–40)

## 2016-09-17 DIAGNOSIS — S83207A Unspecified tear of unspecified meniscus, current injury, left knee, initial encounter: Secondary | ICD-10-CM | POA: Diagnosis not present

## 2016-09-17 DIAGNOSIS — M1712 Unilateral primary osteoarthritis, left knee: Secondary | ICD-10-CM | POA: Diagnosis not present

## 2016-09-17 DIAGNOSIS — M1711 Unilateral primary osteoarthritis, right knee: Secondary | ICD-10-CM | POA: Diagnosis not present

## 2016-09-24 NOTE — Progress Notes (Deleted)
HPI: FU coronary artery disease. Admitted in November of 2011 and ruled in for a subendocardial myocardial infarctions. Cardiac catheterization performed on June 19, 2010 revealed normal LM; 30% proximal LAD; normal Lcx; 90% RCA; Normal EF; patient had 2 drug-eluting stents to the right coronary artery at that time. At last OV, ETT planned but not performed. Abdominal CT November 2017 showed no aneurysm. Since I last saw him,   Current Outpatient Prescriptions  Medication Sig Dispense Refill  . albuterol (PROVENTIL HFA;VENTOLIN HFA) 108 (90 Base) MCG/ACT inhaler Inhale 1-2 puffs into the lungs every 6 (six) hours as needed for wheezing or shortness of breath. 1 Inhaler 0  . albuterol (PROVENTIL) (2.5 MG/3ML) 0.083% nebulizer solution Take 3 mLs (2.5 mg total) by nebulization every 4 (four) hours as needed for wheezing or shortness of breath. 150 mL 1  . aspirin 81 MG EC tablet Take 81 mg by mouth daily.      . budesonide-formoterol (SYMBICORT) 160-4.5 MCG/ACT inhaler Inhale 2 puffs into the lungs 2 (two) times daily. 10.2 g 11  . glucose blood (ONETOUCH VERIO) test strip Use to check BG up to once daily 100 each 2  . HYDROcodone-acetaminophen (NORCO) 5-325 MG tablet Take 1 tablet by mouth every 6 (six) hours as needed for moderate pain. 30 tablet 0  . metoprolol tartrate (LOPRESSOR) 25 MG tablet Take 1 tablet (25 mg total) by mouth 2 (two) times daily. <PLEASE MAKE APPOINTMENT FOR REFILLS> 180 tablet 3  . nitroGLYCERIN (NITROSTAT) 0.4 MG SL tablet Place 1 tablet (0.4 mg total) under the tongue every 5 (five) minutes as needed for chest pain. May repeat up to 3 doses. 25 tablet 3  . ONETOUCH DELICA LANCETS 99991111 MISC Use to check BG up to once daily 100 each 2  . Pitavastatin Calcium (LIVALO) 4 MG TABS Take 1 tablet (4 mg total) by mouth daily. 30 tablet 2   No current facility-administered medications for this visit.      Past Medical History:  Diagnosis Date  . Adenomatous polyps     . COPD (chronic obstructive pulmonary disease) ([place])   . Coronary artery disease   . Diabetes mellitus without complication ([place])   . Diverticulosis   . Hemorrhoids   . Hyperlipidemia   . Influenza A 10/20/2015  . Obesity     Past Surgical History:  Procedure Laterality Date  . ANTERIOR CRUCIATE LIGAMENT REPAIR     S/P  . CORONARY STENT PLACEMENT  2011   2 stents    Social History   Social History  . Marital status: Married    Spouse name: N/A  . Number of children: N/A  . Years of education: N/A   Occupational History  . [employer] Other    [place], [place]   Social History Main Topics  . Smoking status: Former Smoker    Packs/day: 1.50    Years: 30.00    Types: Cigarettes    Quit date: 10/16/2015  . Smokeless tobacco: Former Systems developer  . Alcohol use 0.6 oz/week    1 Standard drinks or equivalent per week  . Drug use: No  . Sexual activity: Yes   Other Topics Concern  . Not on file   Social History Narrative   Has been married twice   Married to current wife for 3 years   No children    Family History  Problem Relation Age of Onset  . Heart attack Mother     Infarction  .  Arrhythmia Father     Atrial fibrillation  . Heart attack Maternal Grandfather   . Heart attack Paternal Grandfather     ROS: no fevers or chills, productive cough, hemoptysis, dysphasia, odynophagia, melena, hematochezia, dysuria, hematuria, rash, seizure activity, orthopnea, PND, pedal edema, claudication. Remaining systems are negative.  Physical Exam: Well-developed well-nourished in no acute distress.  Skin is warm and dry.  HEENT is normal.  Neck is supple.  Chest is clear to auscultation with normal expansion.  Cardiovascular exam is regular rate and rhythm.  Abdominal exam nontender or distended. No masses palpated. Extremities show no edema. neuro grossly intact  ECG- personally reviewed  A/P  1  Kirk Ruths, MD

## 2016-10-01 ENCOUNTER — Ambulatory Visit: Payer: BLUE CROSS/BLUE SHIELD | Admitting: Cardiology

## 2016-10-05 ENCOUNTER — Other Ambulatory Visit: Payer: Self-pay | Admitting: Family Medicine

## 2016-10-14 ENCOUNTER — Ambulatory Visit (INDEPENDENT_AMBULATORY_CARE_PROVIDER_SITE_OTHER): Payer: BLUE CROSS/BLUE SHIELD | Admitting: Family

## 2016-10-14 ENCOUNTER — Encounter: Payer: Self-pay | Admitting: Family

## 2016-10-14 VITALS — BP 105/62 | HR 57 | Temp 96.8°F | Ht 74.0 in | Wt 286.0 lb

## 2016-10-14 DIAGNOSIS — L03012 Cellulitis of left finger: Secondary | ICD-10-CM

## 2016-10-14 MED ORDER — BACITRACIN 500 UNIT/GM EX OINT: 1.0000 "application " | TOPICAL_OINTMENT | Freq: Two times a day (BID) | CUTANEOUS | 0 refills | Status: DC

## 2016-10-14 MED ORDER — CEPHALEXIN 500 MG PO CAPS: 500.0000 mg | ORAL_CAPSULE | Freq: Three times a day (TID) | ORAL | 0 refills | Status: DC

## 2016-10-14 NOTE — Progress Notes (Signed)
   Subjective:    Patient ID: William Carson, male    DOB: 1965-08-15, 51 y.o.   MRN: [id number]  HPI PT presents to the office today with left middle finger infection. Pt states he noticed it two days ago and the redness has become worse and spread around the entire nail. Pt states he has been soaking in peroxide, warm water, and squeezing. Pt states he has moderate amount of "white/yellow pus" coming out which "helped the swelling".  PT reports intermittent pain 2-8 out 10.    Review of Systems  Skin: Positive for wound.  All other systems reviewed and are negative.      Objective:   Physical Exam  Constitutional: He appears well-developed and well-nourished.  Cardiovascular: Normal rate, normal heart sounds and intact distal pulses.   Pulmonary/Chest: Effort normal and breath sounds normal.  Skin: Skin is warm and dry. There is erythema.  Erythemas, mild swelling, and tenderness present on left middle finger      BP 105/62   Pulse (!) 57   Temp (!) 96.8 F (36 C) (Oral)   Ht 6\' 2"  (1.88 m)   Wt 286 lb (129.7 kg)   BMI 36.72 kg/m       Assessment & Plan:  1. Paronychia of finger, left Keep clean and dry -Continue soaking BID -Do not pick or squeeze RTO prn  - cephALEXin (KEFLEX) 500 MG capsule; Take 1 capsule (500 mg total) by mouth 3 (three) times daily.  Dispense: 21 capsule; Refill: 0 - bacitracin 500 UNIT/GM ointment; Apply 1 application topically 2 (two) times daily.  Dispense: 453 g; Refill: 0   [name], FNP

## 2016-10-14 NOTE — Patient Instructions (Signed)
Paronychia Paronychia is an infection of the skin that surrounds a nail. It usually affects the skin around a fingernail, but it may also occur near a toenail. It often causes pain and swelling around the nail. This condition may come on suddenly or develop over a longer period. In some cases, a collection of pus (abscess) can form near or under the nail. Usually, paronychia is not serious and it clears up with treatment. What are the causes? This condition may be caused by bacteria or fungi. It is commonly caused by either Streptococcus or Staphylococcus bacteria. The bacteria or fungi often cause the infection by getting into the affected area through an opening in the skin, such as a cut or a hangnail. What increases the risk? This condition is more likely to develop in:  People who get their hands wet often, such as those who work as dishwashers, bartenders, or nurses.  People who bite their fingernails or suck their thumbs.  People who trim their nails too short.  People who have hangnails or injured fingertips.  People who get manicures.  People who have diabetes. What are the signs or symptoms? Symptoms of this condition include:  Redness and swelling of the skin near the nail.  Tenderness around the nail when you touch the area.  Pus-filled bumps under the cuticle. The cuticle is the skin at the base or sides of the nail.  Fluid or pus under the nail.  Throbbing pain in the area. How is this diagnosed? This condition is usually diagnosed with a physical exam. In some cases, a sample of pus may be taken from an abscess to be tested in a lab. This can help to determine what type of bacteria or fungi is causing the condition. How is this treated? Treatment for this condition depends on the cause and severity of the condition. If the condition is mild, it may clear up on its own in a few days. Your health care provider may recommend soaking the affected area in warm water a few  times a day. When treatment is needed, the options may include:  Antibiotic medicine, if the condition is caused by a bacterial infection.  Antifungal medicine, if the condition is caused by a fungal infection.  Incision and drainage, if an abscess is present. In this procedure, the health care provider will cut open the abscess so the pus can drain out. Follow these instructions at home:  Soak the affected area in warm water if directed to do so by your health care provider. You may be told to do this for 20 minutes, 2-3 times a day. Keep the area dry in between soakings.  Take medicines only as directed by your health care provider.  If you were prescribed an antibiotic medicine, finish all of it even if you start to feel better.  Keep the affected area clean.  Do not try to drain a fluid-filled bump yourself.  If you will be washing dishes or performing other tasks that require your hands to get wet, wear rubber gloves. You should also wear gloves if your hands might come in contact with irritating substances, such as cleaners or chemicals.  Follow your health care provider's instructions about:  Wound care.  Bandage (dressing) changes and removal. Contact a health care provider if:  Your symptoms get worse or do not improve with treatment.  You have a fever or chills.  You have redness spreading from the affected area.  You have continued or increased fluid,   blood, or pus coming from the affected area.  Your finger or knuckle becomes swollen or is difficult to move. This information is not intended to replace advice given to you by your health care provider. Make sure you discuss any questions you have with your health care provider. Document Released: 01/01/2001 Document Revised: 12/14/2015 Document Reviewed: 06/15/2014 Elsevier Interactive Patient Education  2017 Elsevier Inc.  

## 2016-12-02 NOTE — Progress Notes (Signed)
HPI: FU coronary artery disease. Admitted in November of 2011 and ruled in for a subendocardial myocardial infarctions. Cardiac catheterization performed on June 19, 2010 revealed normal LM; 30% proximal LAD; normal Lcx; 90% RCA; normal EF; patient had 2 drug-eluting stents to the right coronary artery at that time. We have scheduled exercise treadmills previously but they have not been performed. Patient is intolerant to most statins but now taking livalo. Since I last saw him, patient notes dyspnea on exertion but no orthopnea, PND, pedal edema, chest pain or syncope.  Current Outpatient Prescriptions  Medication Sig Dispense Refill  . albuterol (PROVENTIL HFA;VENTOLIN HFA) 108 (90 Base) MCG/ACT inhaler Inhale 1-2 puffs into the lungs every 6 (six) hours as needed for wheezing or shortness of breath. 1 Inhaler 0  . albuterol (PROVENTIL) (2.5 MG/3ML) 0.083% nebulizer solution Take 3 mLs (2.5 mg total) by nebulization every 4 (four) hours as needed for wheezing or shortness of breath. 150 mL 1  . aspirin 81 MG EC tablet Take 81 mg by mouth daily.      . bacitracin 500 UNIT/GM ointment Apply 1 application topically 2 (two) times daily. 453 g 0  . budesonide-formoterol (SYMBICORT) 160-4.5 MCG/ACT inhaler Inhale 2 puffs into the lungs 2 (two) times daily. 10.2 g 11  . glucose blood (ONETOUCH VERIO) test strip Use to check BG up to once daily 100 each 2  . HYDROcodone-acetaminophen (NORCO) 5-325 MG tablet Take 1 tablet by mouth every 6 (six) hours as needed for moderate pain. 30 tablet 0  . LIVALO 4 MG TABS TAKE 1 TABLET DAILY 30 tablet 4  . metoprolol tartrate (LOPRESSOR) 25 MG tablet Take 1 tablet (25 mg total) by mouth 2 (two) times daily. <PLEASE MAKE APPOINTMENT FOR REFILLS> 180 tablet 3  . nitroGLYCERIN (NITROSTAT) 0.4 MG SL tablet Place 1 tablet (0.4 mg total) under the tongue every 5 (five) minutes as needed for chest pain. May repeat up to 3 doses. 25 tablet 3  . ONETOUCH DELICA  LANCETS 39J MISC Use to check BG up to once daily 100 each 2   No current facility-administered medications for this visit.      Past Medical History:  Diagnosis Date  . Adenomatous polyps   . COPD (chronic obstructive pulmonary disease) ([place])   . Coronary artery disease   . Diabetes mellitus without complication ([place])   . Diverticulosis   . Hemorrhoids   . Hyperlipidemia   . Influenza A 10/20/2015  . Obesity     Past Surgical History:  Procedure Laterality Date  . ANTERIOR CRUCIATE LIGAMENT REPAIR     S/P  . CORONARY STENT PLACEMENT  2011   2 stents    Social History   Social History  . Marital status: Married    Spouse name: N/A  . Number of children: N/A  . Years of education: N/A   Occupational History  . [employer] Other    [place], [place]   Social History Main Topics  . Smoking status: Former Smoker    Packs/day: 1.50    Years: 30.00    Types: Cigarettes    Quit date: 10/16/2015  . Smokeless tobacco: Former Systems developer  . Alcohol use 0.6 oz/week    1 Standard drinks or equivalent per week  . Drug use: No  . Sexual activity: Yes   Other Topics Concern  . Not on file   Social History Narrative   Has been married twice   Married to current  wife for 3 years   No children    Family History  Problem Relation Age of Onset  . Heart attack Mother        Infarction  . Arrhythmia Father        Atrial fibrillation  . Heart attack Maternal Grandfather   . Heart attack Paternal Grandfather     ROS: Knee arthralgias but no fevers or chills, productive cough, hemoptysis, dysphasia, odynophagia, melena, hematochezia, dysuria, hematuria, rash, seizure activity, orthopnea, PND, pedal edema, claudication. Remaining systems are negative.  Physical Exam: Well-developed obese in no acute distress.  Skin is warm and dry.  HEENT is normal.  Neck is supple.  Chest is clear to auscultation with normal expansion.  Cardiovascular exam is regular rate and rhythm.  Distant heart sounds Abdominal exam nontender or distended. No masses palpated. Extremities show no edema. neuro grossly intact  ECG- Sinus rhythm at a rate of 61. No ST changes. personally reviewed  A/P  1 Coronary artery disease-continue aspirin. Intolerant to statins. Patient complains of dyspnea on exertion. This is likely related to COPD. However we will arrange an exercise treadmill for risk stratification.  2 hyperlipidemia-patient has been intolerant to most statins. Continue Livalo. Laboratories from February 2018 personally reviewed. Total cholesterol 159 with LDL 107. Liver functions normal. I will add Zetia 10 mg daily. Check lipids and liver in 6 weeks. If we are not at goal with above measures I will refer to the lipid clinic for consideration of repatha.  3 tobacco abuse-patient discontinued 1 year ago following bout of pneumonia.  4 obesity-we discussed the importance of risk factor modification including diet, exercise and weight loss.  Kirk Ruths, MD

## 2016-12-06 ENCOUNTER — Encounter: Payer: Self-pay | Admitting: Cardiology

## 2016-12-06 ENCOUNTER — Ambulatory Visit (INDEPENDENT_AMBULATORY_CARE_PROVIDER_SITE_OTHER): Payer: BLUE CROSS/BLUE SHIELD | Admitting: Cardiology

## 2016-12-06 VITALS — BP 106/62 | HR 61 | Ht 75.0 in | Wt 285.6 lb

## 2016-12-06 DIAGNOSIS — I2510 Atherosclerotic heart disease of native coronary artery without angina pectoris: Secondary | ICD-10-CM

## 2016-12-06 DIAGNOSIS — R0609 Other forms of dyspnea: Secondary | ICD-10-CM | POA: Diagnosis not present

## 2016-12-06 DIAGNOSIS — E7800 Pure hypercholesterolemia, unspecified: Secondary | ICD-10-CM

## 2016-12-06 DIAGNOSIS — E669 Obesity, unspecified: Secondary | ICD-10-CM

## 2016-12-06 DIAGNOSIS — R0600 Dyspnea, unspecified: Secondary | ICD-10-CM

## 2016-12-06 MED ORDER — EZETIMIBE 10 MG PO TABS: 10.0000 mg | ORAL_TABLET | Freq: Every day | ORAL | 3 refills | Status: DC

## 2016-12-06 NOTE — Patient Instructions (Signed)
Medication Instructions:   START ZETIA 10 MG ONCE DAILY  Labwork:  Your physician recommends that you return for lab work in: 6 WEEKS=DO NOT EAT PRIOR TO LAB WORK  Testing/Procedures:  Your physician has requested that you have an exercise tolerance test. For further information please visit [email or web address]. Please also follow instruction sheet, as given.    Follow-Up:  Your physician wants you to follow-up in: [place] will receive a reminder letter in the mail two months in advance. If you don't receive a letter, please call our office to schedule the follow-up appointment.   If you need a refill on your cardiac medications before your next appointment, please call your pharmacy.

## 2016-12-10 ENCOUNTER — Ambulatory Visit: Payer: BLUE CROSS/BLUE SHIELD | Admitting: Family Medicine

## 2016-12-11 ENCOUNTER — Encounter: Payer: Self-pay | Admitting: Family Medicine

## 2016-12-12 ENCOUNTER — Ambulatory Visit: Payer: BLUE CROSS/BLUE SHIELD | Admitting: Family Medicine

## 2016-12-19 ENCOUNTER — Ambulatory Visit (INDEPENDENT_AMBULATORY_CARE_PROVIDER_SITE_OTHER): Payer: BLUE CROSS/BLUE SHIELD | Admitting: Family Medicine

## 2016-12-19 ENCOUNTER — Encounter: Payer: Self-pay | Admitting: Family Medicine

## 2016-12-19 VITALS — BP 108/69 | HR 70 | Temp 97.2°F | Ht 75.0 in | Wt 278.6 lb

## 2016-12-19 DIAGNOSIS — E119 Type 2 diabetes mellitus without complications: Secondary | ICD-10-CM | POA: Diagnosis not present

## 2016-12-19 DIAGNOSIS — D126 Benign neoplasm of colon, unspecified: Secondary | ICD-10-CM | POA: Diagnosis not present

## 2016-12-19 DIAGNOSIS — E7800 Pure hypercholesterolemia, unspecified: Secondary | ICD-10-CM | POA: Diagnosis not present

## 2016-12-19 LAB — BAYER DCA HB A1C WAIVED: HB A1C: 5.8 % (ref <7.0)

## 2016-12-19 NOTE — Progress Notes (Signed)
   HPI  Patient presents today here to follow-up for chronic medical conditions.  Type 2 diabetes No medications, watching diet carefully. Preprandial blood sugar checked before lunch is usually in the 90s and low 100s.  Hyperlipidemia Patient states that he started ezetimibe in about 4 days later began having abdominal pain, body aches, and back pain, he stopped the medication today.  Colon polyps Patient reports history of bleeding polyp in 2012, this was removed with colonoscopy. He is unsure of when he needs follow-up. No rectal bleeding at this time  PMH: Smoking status noted ROS: Per HPI  Objective: BP 108/69   Pulse 70   Temp 97.2 F (36.2 C) (Oral)   Ht 6\' 3"  (1.905 m)   Wt 278 lb 9.6 oz (126.4 kg)   BMI 34.82 kg/m  Gen: NAD, alert, cooperative with exam HEENT: NCAT CV: RRR, good S1/S2, no murmur Resp: CTABL, no wheezes, non-labored Ext: No edema, warm Neuro: Alert and oriented, No gross deficits  Assessment and plan:  # Type 2 diabetes Clinically stable, A1c pending. Continue diet control only, patient with good investment with his diet  # Hyperlipidemia Patient stopped ezetimibe, he is continuing his statin. He has a history of statin intolerance Consider WelChol  # Adenomatous polyp of the colon Recommended contacting GI to see when he needs a follow-up colonoscopy, I don't see any recommendations in the chart, however with adenomatous polyps he could need 5 year follow-up which would be due last year. No hyperplastic polyps, he is asymptomatic    Laroy Apple, MD [facility] 12/19/2016, 8:36 AM

## 2016-12-19 NOTE — Patient Instructions (Signed)
Great to see you!  It would be smart to consider seeing Dr. Oneida Alar to discuss when you need another colonoscopy  We will call or send a letter with your A1C.   Come back in 4 months unless you need [place] sooner.

## 2016-12-24 ENCOUNTER — Encounter: Payer: Self-pay | Admitting: Physician Assistant

## 2016-12-24 ENCOUNTER — Ambulatory Visit (INDEPENDENT_AMBULATORY_CARE_PROVIDER_SITE_OTHER): Payer: BLUE CROSS/BLUE SHIELD | Admitting: Physician Assistant

## 2016-12-24 VITALS — BP 118/72 | HR 68 | Temp 98.1°F | Ht 75.0 in | Wt 282.0 lb

## 2016-12-24 DIAGNOSIS — K5792 Diverticulitis of intestine, part unspecified, without perforation or abscess without bleeding: Secondary | ICD-10-CM

## 2016-12-24 DIAGNOSIS — R1031 Right lower quadrant pain: Secondary | ICD-10-CM

## 2016-12-24 DIAGNOSIS — R10824 Left lower quadrant rebound abdominal tenderness: Secondary | ICD-10-CM

## 2016-12-24 DIAGNOSIS — Z87442 Personal history of urinary calculi: Secondary | ICD-10-CM | POA: Insufficient documentation

## 2016-12-24 DIAGNOSIS — Z8719 Personal history of other diseases of the digestive system: Secondary | ICD-10-CM

## 2016-12-24 LAB — URINALYSIS, COMPLETE
Bilirubin, UA: NEGATIVE
Glucose, UA: NEGATIVE
Ketones, UA: NEGATIVE
Leukocytes, UA: NEGATIVE
NITRITE UA: NEGATIVE
Protein, UA: NEGATIVE
RBC UA: NEGATIVE
SPEC GRAV UA: 1.02 (ref 1.005–1.030)
UUROB: 0.2 mg/dL (ref 0.2–1.0)
pH, UA: 7 (ref 5.0–7.5)

## 2016-12-24 LAB — MICROSCOPIC EXAMINATION
Bacteria, UA: NONE SEEN
Epithelial Cells (non renal): NONE SEEN /hpf (ref 0–10)
RBC MICROSCOPIC, UA: NONE SEEN /HPF (ref 0–?)
RENAL EPITHEL UA: NONE SEEN /HPF
WBC UA: NONE SEEN /HPF (ref 0–?)

## 2016-12-24 MED ORDER — OXYCODONE-ACETAMINOPHEN 5-325 MG PO TABS: 1.0000 | ORAL_TABLET | Freq: Three times a day (TID) | ORAL | 0 refills | Status: DC | PRN

## 2016-12-24 MED ORDER — CIPROFLOXACIN HCL 500 MG PO TABS: 500.0000 mg | ORAL_TABLET | Freq: Two times a day (BID) | ORAL | 0 refills | Status: DC

## 2016-12-24 MED ORDER — METRONIDAZOLE 500 MG PO TABS: 500.0000 mg | ORAL_TABLET | Freq: Four times a day (QID) | ORAL | 0 refills | Status: DC

## 2016-12-24 NOTE — Progress Notes (Signed)
BP 118/72   Pulse 68   Temp 98.1 F (36.7 C) (Oral)   Ht 6\' 3"  (1.905 m)   Wt 282 lb (127.9 kg)   BMI 35.25 kg/m    Subjective:    Patient ID: William Carson, male    DOB: [birth year], 51 y.o.   MRN: [id number]  HPI: William Carson is a 51 y.o. male presenting on 12/24/2016 for GI Problem (week, lower left pains, thinks new cholesterol meds not sure, BM Sat, Sun this morning, irregular from normal)  This patient has had one week of abdominal pain concentrated mainly in the left lower quadrant. He has a known history of diverticulitis and kidney stones. He states that he has had decreased number of bowel movements. Normally when he has diverticulitis he has a increased amount. He denies any fever or chills. He has been much less active than normal. He was attributed the beginning to his Zetia. But he has stopped this medicine but the pain has gotten worse.  Relevant past medical, surgical, family and social history reviewed and updated as indicated. Allergies and medications reviewed and updated.  Past Medical History:  Diagnosis Date  . Adenomatous polyps   . COPD (chronic obstructive pulmonary disease) ([place])   . Coronary artery disease   . Diabetes mellitus without complication ([place])   . Diverticulosis   . Hemorrhoids   . Hyperlipidemia   . Influenza A 10/20/2015  . Obesity     Past Surgical History:  Procedure Laterality Date  . ANTERIOR CRUCIATE LIGAMENT REPAIR     S/P  . CORONARY STENT PLACEMENT  2011   2 stents    Review of Systems  Constitutional: Positive for fatigue. Negative for appetite change and fever.  HENT: Negative.   Eyes: Negative.  Negative for pain and visual disturbance.  Respiratory: Negative.  Negative for cough, chest tightness, shortness of breath and wheezing.   Cardiovascular: Negative.  Negative for chest pain, palpitations and leg swelling.  Gastrointestinal: Positive for abdominal pain and constipation. Negative for abdominal distention,  blood in stool, diarrhea, nausea and vomiting.  Endocrine: Negative.   Genitourinary: Negative.  Negative for dysuria and frequency.  Musculoskeletal: Negative.   Skin: Negative.  Negative for color change and rash.  Neurological: Negative.  Negative for weakness, numbness and headaches.  Psychiatric/Behavioral: Negative.     Allergies as of 12/24/2016      Reactions   Crestor [rosuvastatin Calcium] Other (See Comments)   myalgias   Lipitor [atorvastatin] Other (See Comments)   Myalgias   Other Swelling   Mammelain meat allergy   Simvastatin Other (See Comments)   Myalgias      Medication List       Accurate as of 12/24/16 12:49 PM. Always use your most recent med list.          albuterol 108 (90 Base) MCG/ACT inhaler Commonly known as:  PROVENTIL HFA;VENTOLIN HFA Inhale 1-2 puffs into the lungs every 6 (six) hours as needed for wheezing or shortness of breath.   albuterol (2.5 MG/3ML) 0.083% nebulizer solution Commonly known as:  PROVENTIL Take 3 mLs (2.5 mg total) by nebulization every 4 (four) hours as needed for wheezing or shortness of breath.   aspirin 81 MG EC tablet Take 81 mg by mouth daily.   bacitracin 500 UNIT/GM ointment Apply 1 application topically 2 (two) times daily.   budesonide-formoterol 160-4.5 MCG/ACT inhaler Commonly known as:  SYMBICORT Inhale 2 puffs into the lungs 2 (two) times daily.  ciprofloxacin 500 MG tablet Commonly known as:  CIPRO Take 1 tablet (500 mg total) by mouth 2 (two) times daily.   glucose blood test strip Commonly known as:  ONETOUCH VERIO Use to check BG up to once daily   HYDROcodone-acetaminophen 5-325 MG tablet Commonly known as:  NORCO Take 1 tablet by mouth every 6 (six) hours as needed for moderate pain.   LIVALO 4 MG Tabs Generic drug:  Pitavastatin Calcium TAKE 1 TABLET DAILY   metoprolol tartrate 25 MG tablet Commonly known as:  LOPRESSOR Take 1 tablet (25 mg total) by mouth 2 (two) times daily.  <PLEASE MAKE APPOINTMENT FOR REFILLS>   metroNIDAZOLE 500 MG tablet Commonly known as:  FLAGYL Take 1 tablet (500 mg total) by mouth 4 (four) times daily.   nitroGLYCERIN 0.4 MG SL tablet Commonly known as:  NITROSTAT Place 1 tablet (0.4 mg total) under the tongue every 5 (five) minutes as needed for chest pain. May repeat up to 3 doses.   ONETOUCH DELICA LANCETS 42A Misc Use to check BG up to once daily   oxyCODONE-acetaminophen 5-325 MG tablet Commonly known as:  ROXICET Take 1 tablet by mouth every 8 (eight) hours as needed for severe pain.          Objective:    BP 118/72   Pulse 68   Temp 98.1 F (36.7 C) (Oral)   Ht 6\' 3"  (1.905 m)   Wt 282 lb (127.9 kg)   BMI 35.25 kg/m   Allergies  Allergen Reactions  . Crestor [Rosuvastatin Calcium] Other (See Comments)    myalgias  . Lipitor [Atorvastatin] Other (See Comments)    Myalgias   . Other Swelling    Mammelain meat allergy  . Simvastatin Other (See Comments)    Myalgias     Physical Exam  Constitutional: He appears well-developed and well-nourished. He appears distressed.  HENT:  Head: Normocephalic and atraumatic.  Eyes: Conjunctivae and EOM are normal. Pupils are equal, round, and reactive to light.  Neck: Normal range of motion. Neck supple.  Cardiovascular: Normal rate, regular rhythm and normal heart sounds.   Pulmonary/Chest: Effort normal and breath sounds normal.  Abdominal: Soft. Bowel sounds are decreased. There is no hepatosplenomegaly. There is tenderness in the left lower quadrant. There is no rigidity, no rebound, no guarding, no CVA tenderness, no tenderness at McBurney's point and negative Murphy's sign. No hernia.    Musculoskeletal: Normal range of motion.  Skin: Skin is warm and dry.    Results for orders placed or performed in visit on 12/24/16  Microscopic Examination  Result Value Ref Range   WBC, UA None seen 0 - 5 /hpf   RBC, UA None seen 0 - 2 /hpf   Epithelial Cells (non  renal) None seen 0 - 10 /hpf   Renal Epithel, UA None seen None seen /hpf   Bacteria, UA None seen None seen/Few  Urinalysis, Complete  Result Value Ref Range   Specific Gravity, UA 1.020 1.005 - 1.030   pH, UA 7.0 5.0 - 7.5   Color, UA Yellow Yellow   Appearance Ur Clear Clear   Leukocytes, UA Negative Negative   Protein, UA Negative Negative/Trace   Glucose, UA Negative Negative   Ketones, UA Negative Negative   RBC, UA Negative Negative   Bilirubin, UA Negative Negative   Urobilinogen, Ur 0.2 0.2 - 1.0 mg/dL   Nitrite, UA Negative Negative   Microscopic Examination See below:       Assessment &  Plan:   1. Left lower quadrant abdominal tenderness with rebound tenderness - Urinalysis, Complete - Microscopic Examination  2. Right lower quadrant abdominal pain - Urinalysis, Complete - Microscopic Examination  3. History of diverticulitis  4. History of kidney stones  5. Diverticulitis - metroNIDAZOLE (FLAGYL) 500 MG tablet; Take 1 tablet (500 mg total) by mouth 4 (four) times daily.  Dispense: 40 tablet; Refill: 0 - ciprofloxacin (CIPRO) 500 MG tablet; Take 1 tablet (500 mg total) by mouth 2 (two) times daily.  Dispense: 20 tablet; Refill: 0 - oxyCODONE-acetaminophen (ROXICET) 5-325 MG tablet; Take 1 tablet by mouth every 8 (eight) hours as needed for severe pain.  Dispense: 20 tablet; Refill: 0 Patient is instructed to start the antibiotics. He is to call in one day if he gets worse. We will plan a CT if needed.  Dr. Wendi Snipes is aware of the patient's condition.   Current Outpatient Prescriptions:  .  albuterol (PROVENTIL HFA;VENTOLIN HFA) 108 (90 Base) MCG/ACT inhaler, Inhale 1-2 puffs into the lungs every 6 (six) hours as needed for wheezing or shortness of breath., Disp: 1 Inhaler, Rfl: 0 .  albuterol (PROVENTIL) (2.5 MG/3ML) 0.083% nebulizer solution, Take 3 mLs (2.5 mg total) by nebulization every 4 (four) hours as needed for wheezing or shortness of breath., Disp: 150  mL, Rfl: 1 .  aspirin 81 MG EC tablet, Take 81 mg by mouth daily.  , Disp: , Rfl:  .  bacitracin 500 UNIT/GM ointment, Apply 1 application topically 2 (two) times daily., Disp: 453 g, Rfl: 0 .  budesonide-formoterol (SYMBICORT) 160-4.5 MCG/ACT inhaler, Inhale 2 puffs into the lungs 2 (two) times daily., Disp: 10.2 g, Rfl: 11 .  glucose blood (ONETOUCH VERIO) test strip, Use to check BG up to once daily, Disp: 100 each, Rfl: 2 .  HYDROcodone-acetaminophen (NORCO) 5-325 MG tablet, Take 1 tablet by mouth every 6 (six) hours as needed for moderate pain., Disp: 30 tablet, Rfl: 0 .  LIVALO 4 MG TABS, TAKE 1 TABLET DAILY, Disp: 30 tablet, Rfl: 4 .  metoprolol tartrate (LOPRESSOR) 25 MG tablet, Take 1 tablet (25 mg total) by mouth 2 (two) times daily. <PLEASE MAKE APPOINTMENT FOR REFILLS>, Disp: 180 tablet, Rfl: 3 .  nitroGLYCERIN (NITROSTAT) 0.4 MG SL tablet, Place 1 tablet (0.4 mg total) under the tongue every 5 (five) minutes as needed for chest pain. May repeat up to 3 doses., Disp: 25 tablet, Rfl: 3 .  ONETOUCH DELICA LANCETS 65K MISC, Use to check BG up to once daily, Disp: 100 each, Rfl: 2 .  ciprofloxacin (CIPRO) 500 MG tablet, Take 1 tablet (500 mg total) by mouth 2 (two) times daily., Disp: 20 tablet, Rfl: 0 .  metroNIDAZOLE (FLAGYL) 500 MG tablet, Take 1 tablet (500 mg total) by mouth 4 (four) times daily., Disp: 40 tablet, Rfl: 0 .  oxyCODONE-acetaminophen (ROXICET) 5-325 MG tablet, Take 1 tablet by mouth every 8 (eight) hours as needed for severe pain., Disp: 20 tablet, Rfl: 0  Continue all other maintenance medications as listed above.  Follow up plan: Return if symptoms worsen or fail to improve.  Educational handout given for diverticulitis  Terald Sleeper PA-C [facility] [address]  [place], [place] [address] [phone]   12/24/2016, 12:49 PM

## 2016-12-24 NOTE — Patient Instructions (Signed)

## 2016-12-25 ENCOUNTER — Ambulatory Visit: Payer: BLUE CROSS/BLUE SHIELD | Admitting: Family Medicine

## 2016-12-27 ENCOUNTER — Ambulatory Visit (INDEPENDENT_AMBULATORY_CARE_PROVIDER_SITE_OTHER): Payer: BLUE CROSS/BLUE SHIELD | Admitting: Physician Assistant

## 2016-12-27 ENCOUNTER — Emergency Department (HOSPITAL_COMMUNITY)
Admission: EM | Admit: 2016-12-27 | Discharge: 2016-12-27 | Disposition: A | Discharge status: Home / Self Care | Payer: BLUE CROSS/BLUE SHIELD | Attending: Emergency Medicine | Admitting: Emergency Medicine

## 2016-12-27 ENCOUNTER — Ambulatory Visit (HOSPITAL_COMMUNITY)
Admission: RE | Admit: 2016-12-27 | Discharge: 2016-12-27 | Disposition: A | Discharge status: Home / Self Care | Payer: BLUE CROSS/BLUE SHIELD | Source: Ambulatory Visit | Attending: Physician Assistant | Admitting: Physician Assistant

## 2016-12-27 ENCOUNTER — Inpatient Hospital Stay (HOSPITAL_COMMUNITY): Admission: RE | Admit: 2016-12-27 | Payer: BLUE CROSS/BLUE SHIELD | Source: Ambulatory Visit

## 2016-12-27 ENCOUNTER — Encounter (HOSPITAL_COMMUNITY): Payer: Self-pay | Admitting: *Deleted

## 2016-12-27 ENCOUNTER — Encounter: Payer: Self-pay | Admitting: Physician Assistant

## 2016-12-27 VITALS — BP 111/76 | HR 67 | Temp 96.7°F | Ht 75.0 in | Wt 278.6 lb

## 2016-12-27 DIAGNOSIS — R1032 Left lower quadrant pain: Secondary | ICD-10-CM

## 2016-12-27 DIAGNOSIS — K5780 Diverticulitis of intestine, part unspecified, with perforation and abscess without bleeding: Secondary | ICD-10-CM | POA: Diagnosis not present

## 2016-12-27 DIAGNOSIS — Z87891 Personal history of nicotine dependence: Secondary | ICD-10-CM | POA: Insufficient documentation

## 2016-12-27 DIAGNOSIS — K5792 Diverticulitis of intestine, part unspecified, without perforation or abscess without bleeding: Secondary | ICD-10-CM | POA: Diagnosis not present

## 2016-12-27 DIAGNOSIS — Z9861 Coronary angioplasty status: Secondary | ICD-10-CM | POA: Insufficient documentation

## 2016-12-27 DIAGNOSIS — K5732 Diverticulitis of large intestine without perforation or abscess without bleeding: Secondary | ICD-10-CM | POA: Diagnosis not present

## 2016-12-27 DIAGNOSIS — Z79899 Other long term (current) drug therapy: Secondary | ICD-10-CM | POA: Insufficient documentation

## 2016-12-27 DIAGNOSIS — J9811 Atelectasis: Secondary | ICD-10-CM

## 2016-12-27 DIAGNOSIS — E119 Type 2 diabetes mellitus without complications: Secondary | ICD-10-CM | POA: Diagnosis not present

## 2016-12-27 DIAGNOSIS — J449 Chronic obstructive pulmonary disease, unspecified: Secondary | ICD-10-CM | POA: Insufficient documentation

## 2016-12-27 DIAGNOSIS — Z7982 Long term (current) use of aspirin: Secondary | ICD-10-CM | POA: Insufficient documentation

## 2016-12-27 DIAGNOSIS — K5720 Diverticulitis of large intestine with perforation and abscess without bleeding: Secondary | ICD-10-CM | POA: Insufficient documentation

## 2016-12-27 DIAGNOSIS — K630 Abscess of intestine: Secondary | ICD-10-CM

## 2016-12-27 MED ORDER — ACETAMINOPHEN 325 MG PO TABS: 650.0000 mg | ORAL_TABLET | Freq: Once | ORAL | Status: DC

## 2016-12-27 MED ORDER — IOPAMIDOL (ISOVUE-300) INJECTION 61%
100.0000 mL | Freq: Once | INTRAVENOUS | Status: AC | PRN
  Administered 2016-12-27: 100 mL via INTRAVENOUS

## 2016-12-27 NOTE — Discharge Instructions (Signed)
I discussed your case with the general surgeon on-call. He agreed that you could be treated as an out patient. Continue antibiotics. Return for fever, chills, worsening pain.

## 2016-12-27 NOTE — ED Provider Notes (Signed)
[facility] DEPT Provider Note   CSN: [id number] Arrival date & time: 12/27/16  1731     History   Chief Complaint Chief Complaint  Patient presents with  . Abdominal Pain    HPI William Carson is a 51 y.o. male.  Left lower quadrant pain for 3 weeks. He was seen by his primary care doctor this week and put on Cipro and Flagyl for suspected diverticulitis. A CT scan was ordered as an outpatient today. Patient was instructed to follow-up in the ED. No fever, sweats, chills. He is eating and ambulatory. Severity of pain is moderate. Palpation makes pain worse.      Past Medical History:  Diagnosis Date  . Adenomatous polyps   . COPD (chronic obstructive pulmonary disease) ([place])   . Coronary artery disease   . Diabetes mellitus without complication ([place])   . Diverticulosis   . Hemorrhoids   . Hyperlipidemia   . Influenza A 10/20/2015  . Obesity     Patient Active Problem List   Diagnosis Date Noted  . History of diverticulitis 12/24/2016  . History of kidney stones 12/24/2016  . T2DM (type 2 diabetes mellitus) ([place]) 05/13/2016  . Allergy to meat 05/07/2016  . Chronic left-sided low back pain with left-sided sciatica 05/07/2016  . Respiratory failure with hypoxia and hypercapnia ([place]) 10/20/2015  . Obesity (BMI 30-39.9) 08/04/2015  . Low serum vitamin D 04/12/2015  . Tobacco abuse 01/18/2011  . Hyperlipidemia 07/18/2010  . Obesity 07/18/2010  . CAD S/P percutaneous coronary angioplasty 07/18/2010  . COPD (chronic obstructive pulmonary disease) ([place]) 07/18/2010    Past Surgical History:  Procedure Laterality Date  . ANTERIOR CRUCIATE LIGAMENT REPAIR     S/P  . CORONARY STENT PLACEMENT  2011   2 stents       Home Medications    Prior to Admission medications   Medication Sig Start Date End Date Taking? Authorizing Provider  albuterol (PROVENTIL HFA;VENTOLIN HFA) 108 (90 Base) MCG/ACT inhaler Inhale 1-2 puffs into the lungs every 6 (six) hours as needed  for wheezing or shortness of breath. 10/18/15  Yes Nat Christen, MD  aspirin 81 MG EC tablet Take 81 mg by mouth daily.     Yes [provider]  budesonide-formoterol (SYMBICORT) 160-4.5 MCG/ACT inhaler Inhale 2 puffs into the lungs 2 (two) times daily. Patient taking differently: Inhale 2 puffs into the lungs daily.  05/07/16  Yes Timmothy Euler, MD  ciprofloxacin (CIPRO) 500 MG tablet Take 1 tablet (500 mg total) by mouth 2 (two) times daily. 12/24/16  Yes Terald Sleeper, PA-C  HYDROcodone-acetaminophen (NORCO) 5-325 MG tablet Take 1 tablet by mouth every 6 (six) hours as needed for moderate pain. 05/07/16  Yes Timmothy Euler, MD  LIVALO 4 MG TABS TAKE 1 TABLET DAILY Patient taking differently: TAKE 1 TABLET BY MOUTH EVERY OTHER DAY 10/07/16  Yes Timmothy Euler, MD  metoprolol tartrate (LOPRESSOR) 25 MG tablet Take 1 tablet (25 mg total) by mouth 2 (two) times daily. <PLEASE MAKE APPOINTMENT FOR REFILLS> Patient taking differently: Take 25 mg by mouth 2 (two) times daily.  09/09/16  Yes Timmothy Euler, MD  metroNIDAZOLE (FLAGYL) 500 MG tablet Take 1 tablet (500 mg total) by mouth 4 (four) times daily. 12/24/16  Yes Terald Sleeper, PA-C  nitroGLYCERIN (NITROSTAT) 0.4 MG SL tablet Place 1 tablet (0.4 mg total) under the tongue every 5 (five) minutes as needed for chest pain. May repeat up to 3 doses. 12/01/15  Yes Timmothy Euler, MD  oxyCODONE-acetaminophen (ROXICET) 5-325 MG tablet Take 1 tablet by mouth every 8 (eight) hours as needed for severe pain. 12/24/16  Yes Terald Sleeper, PA-C  albuterol (PROVENTIL) (2.5 MG/3ML) 0.083% nebulizer solution Take 3 mLs (2.5 mg total) by nebulization every 4 (four) hours as needed for wheezing or shortness of breath. 10/31/15   Timmothy Euler, MD  glucose blood [facility] VERIO) test strip Use to check BG up to once daily 05/14/16   Cherre Robins, PharmD  [facility] DELICA LANCETS 42P MISC Use to check BG up to once daily 05/14/16   Cherre Robins, PharmD    Family History Family History  Problem Relation Age of Onset  . Heart attack Mother        Infarction  . Arrhythmia Father        Atrial fibrillation  . Heart attack Maternal Grandfather   . Heart attack Paternal Grandfather     Social History Social History  Substance Use Topics  . Smoking status: Former Smoker    Packs/day: 1.50    Years: 30.00    Types: Cigarettes    Quit date: 10/16/2015  . Smokeless tobacco: Former Systems developer  . Alcohol use 0.6 oz/week    1 Standard drinks or equivalent per week     Allergies   Crestor [rosuvastatin calcium]; Lipitor [atorvastatin]; Other; and Simvastatin   Review of Systems Review of Systems  All other systems reviewed and are negative.    Physical Exam Updated Vital Signs BP 123/74   Pulse 77   Temp 97.8 F (36.6 C) (Oral)   Resp 20   Ht 6\' 3"  (1.905 m)   Wt 127 kg (280 lb)   SpO2 98%   BMI 35.00 kg/m   Physical Exam  Constitutional: He is oriented to person, place, and time.  Obese, no acute distress  HENT:  Head: Normocephalic and atraumatic.  Eyes: Conjunctivae are normal.  Neck: Neck supple.  Cardiovascular: Normal rate and regular rhythm.   Pulmonary/Chest: Effort normal and breath sounds normal.  Abdominal:  Tender left lower quadrant  Musculoskeletal: Normal range of motion.  Neurological: He is alert and oriented to person, place, and time.  Skin: Skin is warm and dry.  Psychiatric: He has a normal mood and affect. His behavior is normal.  Nursing note and vitals reviewed.    ED Treatments / Results  Labs (all labs ordered are listed, but only abnormal results are displayed) Labs Reviewed - No data to display  EKG  EKG Interpretation None       Radiology Ct Abdomen Pelvis W Contrast  Result Date: 12/27/2016 CLINICAL DATA:  Progressive left lower quadrant pain EXAM: CT ABDOMEN AND PELVIS WITH CONTRAST TECHNIQUE: Multidetector CT imaging of the abdomen and pelvis was performed  using the standard protocol following bolus administration of intravenous contrast. CONTRAST:  147mL ISOVUE-300 IOPAMIDOL (ISOVUE-300) INJECTION 61% COMPARISON:  05/31/2016 FINDINGS: Lower chest: There is chronic atelectasis at both lung bases posterior medially, slightly progressed at the left base. Hepatobiliary: 8 mm cyst in the dome of the left lobe of the liver, of no significance. Gallbladder is contracted. No dilated bile ducts. Pancreas: Unremarkable. No pancreatic ductal dilatation or surrounding inflammatory changes. Spleen: Normal in size without focal abnormality. Adrenals/Urinary Tract: 15 and 13 mm cyst in the mid right kidney. 9 mm exophytic cyst on the lower pole of the right kidney. 11 mm cyst in the mid left kidney. Kidneys are otherwise normal. Adrenal glands are  normal. No hydronephrosis. Bladder is normal. Stomach/Bowel: There is a pericolonic abscess in the left lower quadrant arising from an area of diverticulitis. There is no pus collection but there are air collections in the pericolonic soft tissues extending over an area of approximately 6 cm in diameter. There are numerous diverticula in the mid sigmoid portion of the colon with scattered diverticula in the descending colon. The small bowel and appendix appear normal. Stomach appears normal. Vascular/Lymphatic: Aortic atherosclerosis.  No adenopathy. Reproductive: Prostate is unremarkable. Other: No abdominal wall hernia or abnormality. No abdominopelvic ascites. Musculoskeletal: No acute or significant osseous findings. IMPRESSION: Sigmoid diverticulitis with pericolonic abscess containing air but no discrete fluid. I suspect this represents a resolving pericolonic abscess given the patient's 3 week antibody treatment. Diverticulitis of the sigmoid region. Chronic bibasilar atelectasis, increased slightly at the left lung base. Electronically Signed   By: Lorriane Shire M.D.   On: 12/27/2016 16:48    Procedures Procedures (including  critical care time)  Medications Ordered in ED Medications - No data to display   Initial Impression / Assessment and Plan / ED Course  I have reviewed the triage vital signs and the nursing notes.  Pertinent labs & imaging results that were available during my care of the patient were reviewed by me and considered in my medical decision making (see chart for details).     Patient is hemodynamically stable. Normal vital signs. He does not have an acute abdomen. I discussed his CT report with the general surgeon on-call. We both agreed the patient could be treated as an outpatient. This was discussed with the patient and his wife who is a retired Marine scientist. He will continue Cipro and Flagyl and return for fever, chills, worsening pain.  Final Clinical Impressions(s) / ED Diagnoses   Final diagnoses:  Diverticulitis  Pericolonic abscess    New Prescriptions New Prescriptions   No medications on file     Nat Christen, MD 12/27/16 [phone]

## 2016-12-27 NOTE — ED Triage Notes (Signed)
Here from CT   To be admitted thru ED per RN  [place] Dr Wendi Snipes is PCP

## 2016-12-27 NOTE — Patient Instructions (Signed)

## 2016-12-27 NOTE — Progress Notes (Signed)
Subjective:     Patient ID: William Carson, male   DOB: 12-20-1965, 51 y.o.   MRN: [id number]  HPI Pt seen earlier this week for LLQ abd pain He was started on Cipro and Flagyl to cover diverticulitis Sx have continued despite treatment and here for further review + Hx of diverticulitis in the past but states this is acting differently Also questionable hx of renal stone but states he has never been seen prev for dx  Review of Systems  Constitutional: Positive for activity change and chills. Negative for appetite change, diaphoresis, fatigue and fever.  Gastrointestinal: Positive for abdominal pain. Negative for abdominal distention, anal bleeding, blood in stool, constipation, diarrhea, nausea and vomiting.  Genitourinary: Negative for decreased urine volume, dysuria, flank pain, frequency, hematuria and urgency.       Objective:   Physical Exam  Constitutional: He appears well-developed and well-nourished.  HENT:  Mouth/Throat: Oropharynx is clear and moist. No oropharyngeal exudate.  Cardiovascular: Normal rate, regular rhythm and normal heart sounds.   No murmur heard. Pulmonary/Chest: Effort normal and breath sounds normal.  Abdominal: Soft. He exhibits no distension and no mass. There is tenderness. There is no rebound and no guarding.  +TTP to the LLQ  and now to the suprapubic region  Nursing note and vitals reviewed. Prev labs reviewed     Assessment:     LLQ abdominal pain - Plan: CT Abdomen Pelvis W Contrast      Plan:     Due to progressive sx despite current ATB tx will proceed with CT scan today Pt would like at the [facility] area Will f/u with pt following scan Call report of CT scan showed diverticulitis and pericolonic abscess Due to progression of sx discussed with pt and wife would like for them to be eval through ER They agreed F/U here per ER

## 2017-01-14 ENCOUNTER — Telehealth (HOSPITAL_COMMUNITY): Payer: Self-pay

## 2017-01-14 NOTE — Telephone Encounter (Signed)
Encounter complete. 

## 2017-01-16 ENCOUNTER — Ambulatory Visit (HOSPITAL_COMMUNITY)
Admission: RE | Admit: 2017-01-16 | Discharge: 2017-01-16 | Disposition: A | Discharge status: Home / Self Care | Payer: BLUE CROSS/BLUE SHIELD | Source: Ambulatory Visit | Attending: Cardiology | Admitting: Cardiology

## 2017-01-16 DIAGNOSIS — I2510 Atherosclerotic heart disease of native coronary artery without angina pectoris: Secondary | ICD-10-CM

## 2017-01-16 DIAGNOSIS — R0609 Other forms of dyspnea: Secondary | ICD-10-CM | POA: Insufficient documentation

## 2017-01-16 DIAGNOSIS — R0600 Dyspnea, unspecified: Secondary | ICD-10-CM

## 2017-01-16 LAB — EXERCISE TOLERANCE TEST
CSEPED: 3 min
CSEPEDS: 26 s
CSEPEW: 5.1 METS
CSEPHR: 75 %
CSEPPHR: 126 {beats}/min
MPHR: 168 {beats}/min
RPE: 19
Rest HR: 64 {beats}/min

## 2017-01-27 ENCOUNTER — Encounter: Payer: Self-pay | Admitting: *Deleted

## 2017-03-21 ENCOUNTER — Encounter: Payer: Self-pay | Admitting: Family Medicine

## 2017-03-21 ENCOUNTER — Ambulatory Visit (INDEPENDENT_AMBULATORY_CARE_PROVIDER_SITE_OTHER): Payer: BLUE CROSS/BLUE SHIELD | Admitting: Family Medicine

## 2017-03-21 VITALS — BP 97/61 | HR 60 | Temp 96.5°F | Ht 75.0 in | Wt 284.0 lb

## 2017-03-21 DIAGNOSIS — E7800 Pure hypercholesterolemia, unspecified: Secondary | ICD-10-CM | POA: Diagnosis not present

## 2017-03-21 DIAGNOSIS — E119 Type 2 diabetes mellitus without complications: Secondary | ICD-10-CM | POA: Diagnosis not present

## 2017-03-21 DIAGNOSIS — H8110 Benign paroxysmal vertigo, unspecified ear: Secondary | ICD-10-CM | POA: Diagnosis not present

## 2017-03-21 LAB — BAYER DCA HB A1C WAIVED: HB A1C: 6.4 % (ref <7.0)

## 2017-03-21 MED ORDER — MECLIZINE HCL 25 MG PO TABS: 25.0000 mg | ORAL_TABLET | Freq: Three times a day (TID) | ORAL | 0 refills | Status: DC | PRN

## 2017-03-21 NOTE — Patient Instructions (Addendum)
Great to see you!  Come back in 4 months unless you need [place] sooner.   Try Meclizine for dizziness, also consider trying Epley's Manuevers     Continue current medications. Continue good therapeutic lifestyle changes which include good diet and exercise. Fall precautions discussed with patient. If an FOBT was given today- please return it to our front desk. If you are over 52 years old - you may need Prevnar 31 or the adult Pneumonia vaccine.  **Flu shots are available--- please call and schedule a FLU-CLINIC appointment**  After your visit with [place] today you will receive a survey in the mail or online from [employer] regarding your care with [place]. Please take a moment to fill this out. Your feedback is very important to [place] as you can help [place] better understand your patient needs as well as improve your experience and satisfaction. WE CARE ABOUT YOU!!!

## 2017-03-21 NOTE — Progress Notes (Signed)
   HPI  Patient presents today here to follow-up for chronic medical conditions.  Type 2 diabetes Random blood sugars consistently in the 90s. No medications He is watching his diet moderately. He's occupationally active but has no formal exercise routine.  Hypercholesterolemia Patient has stopped his area, he states that it causes myopathies, he is still taking his statin. He is fasting today Would like copy of labs sent to Dr. Stanford Breed No side effects.  Dizziness Patient states for about 1-1/2 weeks he had dizziness off and on. He states that most the time it's just unsteadiness type feeling. At times it's worse with standing up. He's been consistently checking his blood pressure and has been in the 309M systolic.     PMH: Smoking status noted ROS: Per HPI  Objective: BP 97/61 (BP Location: Right Arm)   Pulse 60   Temp (!) 96.5 F (35.8 C) (Oral)   Ht 6\' 3"  (1.905 m)   Wt 284 lb (128.8 kg)   BMI 35.50 kg/m  Gen: NAD, alert, cooperative with exam HEENT: NCAT, EOMI, PERRL, TMs normal bilaterally, nares clear CV: RRR, good S1/S2, no murmur Resp: CTABL, no wheezes, non-labored Ext: No edema, warm Neuro: Alert and oriented, No gross deficits  Diabetic Foot Exam - Simple   Simple Foot Form Diabetic Foot exam was performed with the following findings:  Yes 03/21/2017  8:40 AM  Visual Inspection No deformities, no ulcerations, no other skin breakdown bilaterally:  Yes Sensation Testing Intact to touch and monofilament testing bilaterally:  Yes Pulse Check Posterior Tibialis and Dorsalis pulse intact bilaterally:  Yes Comments Thick callus BL heel      Assessment and plan:  # BPPV Symptoms most consistent with vertigo Discussed Epley's maneuver, trial of meclizine No red flags  # Type 2 diabetes A1c pending, likely well controlled No medications, diet controlled Congratulated on success  # Pure hypercholesterolemia With history of CAD LDL goal less than  70 Patient did not tolerate Zetia, he has tolerated livalo Consider repatha Labs today    Meds ordered this encounter  Medications  . meclizine (ANTIVERT) 25 MG tablet    Sig: Take 1 tablet (25 mg total) by mouth 3 (three) times daily as needed for dizziness.    Dispense:  30 tablet    Refill:  0    Laroy Apple, MD [facility] 03/21/2017, 8:31 AM

## 2017-03-22 LAB — CMP14+EGFR
ALK PHOS: 41 IU/L (ref 39–117)
ALT: 53 IU/L — ABNORMAL HIGH (ref 0–44)
AST: 32 IU/L (ref 0–40)
Albumin/Globulin Ratio: 1.8 (ref 1.2–2.2)
Albumin: 4.4 g/dL (ref 3.5–5.5)
BILIRUBIN TOTAL: 0.4 mg/dL (ref 0.0–1.2)
BUN / CREAT RATIO: 11 (ref 9–20)
BUN: 10 mg/dL (ref 6–24)
CHLORIDE: 105 mmol/L (ref 96–106)
CO2: 23 mmol/L (ref 20–29)
Calcium: 9.9 mg/dL (ref 8.7–10.2)
Creatinine, Ser: 0.92 mg/dL (ref 0.76–1.27)
GFR calc Af Amer: 110 mL/min/{1.73_m2} (ref >59)
GFR calc non Af Amer: 95 mL/min/{1.73_m2} (ref >59)
GLUCOSE: 126 mg/dL — AB (ref 65–99)
Globulin, Total: 2.5 g/dL (ref 1.5–4.5)
Potassium: 4.8 mmol/L (ref 3.5–5.2)
SODIUM: 145 mmol/L — AB (ref 134–144)
Total Protein: 6.9 g/dL (ref 6.0–8.5)

## 2017-03-22 LAB — CBC WITH DIFFERENTIAL/PLATELET
BASOS ABS: 0.1 10*3/uL (ref 0.0–0.2)
Basos: 1 %
EOS (ABSOLUTE): 0.2 10*3/uL (ref 0.0–0.4)
Eos: 2 %
Hematocrit: 46.9 % (ref 37.5–51.0)
Hemoglobin: 15.3 g/dL (ref 13.0–17.7)
Immature Grans (Abs): 0 10*3/uL (ref 0.0–0.1)
Immature Granulocytes: 0 %
Lymphocytes Absolute: 1.7 10*3/uL (ref 0.7–3.1)
Lymphs: 20 %
MCH: 30.3 pg (ref 26.6–33.0)
MCHC: 32.6 g/dL (ref 31.5–35.7)
MCV: 93 fL (ref 79–97)
MONOS ABS: 1 10*3/uL — AB (ref 0.1–0.9)
Monocytes: 11 %
NEUTROS PCT: 66 %
Neutrophils Absolute: 5.9 10*3/uL (ref 1.4–7.0)
PLATELETS: 312 10*3/uL (ref 150–379)
RBC: 5.05 x10E6/uL (ref 4.14–5.80)
RDW: 15.6 % — ABNORMAL HIGH (ref 12.3–15.4)
WBC: 8.9 10*3/uL (ref 3.4–10.8)

## 2017-03-22 LAB — LIPID PANEL
CHOL/HDL RATIO: 4 ratio (ref 0.0–5.0)
CHOLESTEROL TOTAL: 137 mg/dL (ref 100–199)
HDL: 34 mg/dL — ABNORMAL LOW (ref >39)
LDL CALC: 88 mg/dL (ref 0–99)
Triglycerides: 75 mg/dL (ref 0–149)
VLDL CHOLESTEROL CAL: 15 mg/dL (ref 5–40)

## 2017-03-25 ENCOUNTER — Telehealth: Payer: Self-pay | Admitting: Family Medicine

## 2017-03-25 NOTE — Telephone Encounter (Signed)
Patient has not improved since beginning the Meclizine.  Has actually been feeling more light headed and dizzy.  Would like to know what you recommend he do or if there is anything he could try?

## 2017-03-25 NOTE — Telephone Encounter (Signed)
Apt made

## 2017-03-25 NOTE — Telephone Encounter (Signed)
Needs to be seen   May be BP or vertigo  Laroy Apple, MD [facility] 03/25/2017, 3:12 PM

## 2017-03-27 ENCOUNTER — Encounter: Payer: Self-pay | Admitting: Family Medicine

## 2017-03-27 ENCOUNTER — Ambulatory Visit (INDEPENDENT_AMBULATORY_CARE_PROVIDER_SITE_OTHER): Payer: BLUE CROSS/BLUE SHIELD | Admitting: Family Medicine

## 2017-03-27 VITALS — BP 126/73 | HR 69 | Temp 97.3°F | Ht 75.0 in | Wt 286.3 lb

## 2017-03-27 DIAGNOSIS — R51 Headache: Secondary | ICD-10-CM | POA: Diagnosis not present

## 2017-03-27 DIAGNOSIS — R42 Dizziness and giddiness: Secondary | ICD-10-CM | POA: Diagnosis not present

## 2017-03-27 DIAGNOSIS — R519 Headache, unspecified: Secondary | ICD-10-CM

## 2017-03-27 MED ORDER — DIAZEPAM 10 MG PO TABS: 5.0000 mg | ORAL_TABLET | Freq: Three times a day (TID) | ORAL | 0 refills | Status: DC | PRN

## 2017-03-27 NOTE — Progress Notes (Signed)
   HPI  Patient presents today here for dizziness and headache.  Patient has been having these symptoms for about 2 weeks. He was seen on 8/31 for similar symptoms, he had been about 1-1/2 weeks at that time, so he has actually had symptoms for about 3 weeks. Described as severe headaches that resolve and then he is left with severe dizziness. At times he has room spinning sensation and at times it is only an unsteadiness type sensation. He has had worsening with meclizine on a few doses and help with meclizine on one dose. Epley's maneuvers have been minimally helpful.  His wife is with him, she is a retired Therapist, sports, she believes that he's been stumbling more than usual. He does not have any focal deficits  PMH: Smoking status noted ROS: Per HPI  Objective: BP 126/73   Pulse 69   Temp (!) 97.3 F (36.3 C) (Oral)   Ht 6\' 3"  (1.905 m)   Wt 286 lb 4.8 oz (129.9 kg)   BMI 35.79 kg/m  Gen: NAD, alert, cooperative with exam HEENT: NCAT, EOMI, PERRL CV: RRR, good S1/S2, no murmur Resp: CTABL, no wheezes, non-labored Ext: No edema, warm Neuro: Alert and oriented, strength 5/5 and sensation intact in bilateral lower extremities, normal gait observed, 2+ patellar tendon reflexes bilaterally  Assessment and plan:  # Headaches, dizziness Patient with 2 weeks of symptoms No clear neuro deficits, however he does have some subjective ataxia observed by his wife. He has a history indicative of high risk for CVA including CAD status post stenting, type 2 diabetes, hyperlipidemia. Given his persistent symptoms, which are characteristic of benign vertigo, I have recommended MRI brain. Given short course of Valium to see if I can help with the dizziness.      Orders Placed This Encounter  Procedures  . MR Brain Wo Contrast    Standing Status:   Future    Standing Expiration Date:   05/27/2018    Order Specific Question:   What is the patient's sedation requirement?    Answer:   No Sedation      Order Specific Question:   Does the patient have a pacemaker or implanted devices?    Answer:   No    Order Specific Question:   Preferred imaging location?    Answer:   [facility] (table limit-350lbs)    Order Specific Question:   Radiology Contrast Protocol - do NOT remove file path    Answer:   [email or web address]    Meds ordered this encounter  Medications  . diazepam (VALIUM) 10 MG tablet    Sig: Take 0.5-1 tablets (5-10 mg total) by mouth every 8 (eight) hours as needed (dizziness).    Dispense:  30 tablet    Refill:  0    Laroy Apple, MD [facility] 03/27/2017, 4:59 PM

## 2017-03-27 NOTE — Patient Instructions (Signed)
Great to see you!  We will work on an MRI  Try valium for your symptoms.

## 2017-04-08 ENCOUNTER — Ambulatory Visit (HOSPITAL_COMMUNITY)
Admission: RE | Admit: 2017-04-08 | Discharge: 2017-04-08 | Disposition: A | Discharge status: Home / Self Care | Payer: BLUE CROSS/BLUE SHIELD | Source: Ambulatory Visit | Attending: Family Medicine | Admitting: Family Medicine

## 2017-04-08 DIAGNOSIS — R51 Headache: Secondary | ICD-10-CM | POA: Diagnosis not present

## 2017-04-08 DIAGNOSIS — R519 Headache, unspecified: Secondary | ICD-10-CM

## 2017-04-08 DIAGNOSIS — R42 Dizziness and giddiness: Secondary | ICD-10-CM | POA: Diagnosis not present

## 2017-04-10 ENCOUNTER — Other Ambulatory Visit: Payer: Self-pay | Admitting: *Deleted

## 2017-04-10 DIAGNOSIS — R42 Dizziness and giddiness: Secondary | ICD-10-CM

## 2017-04-21 ENCOUNTER — Ambulatory Visit (INDEPENDENT_AMBULATORY_CARE_PROVIDER_SITE_OTHER): Payer: BLUE CROSS/BLUE SHIELD | Admitting: Family Medicine

## 2017-04-21 ENCOUNTER — Encounter: Payer: Self-pay | Admitting: Family Medicine

## 2017-04-21 VITALS — BP 125/83 | HR 88 | Temp 97.5°F | Ht 75.0 in | Wt 285.8 lb

## 2017-04-21 DIAGNOSIS — R42 Dizziness and giddiness: Secondary | ICD-10-CM | POA: Diagnosis not present

## 2017-04-21 DIAGNOSIS — H8143 Vertigo of central origin, bilateral: Secondary | ICD-10-CM | POA: Diagnosis not present

## 2017-04-21 DIAGNOSIS — J322 Chronic ethmoidal sinusitis: Secondary | ICD-10-CM | POA: Diagnosis not present

## 2017-04-21 DIAGNOSIS — H903 Sensorineural hearing loss, bilateral: Secondary | ICD-10-CM | POA: Diagnosis not present

## 2017-04-21 DIAGNOSIS — J320 Chronic maxillary sinusitis: Secondary | ICD-10-CM | POA: Diagnosis not present

## 2017-04-21 NOTE — Patient Instructions (Signed)
Great to see you!   Please let me know if you have any other concerns or ideas.   We will work on scheduling the ultrasound.

## 2017-04-21 NOTE — Progress Notes (Signed)
   HPI  Patient presents today here to follow-up for dizziness.  Patient was seen by ENT today, they state that there is no clear diagnosis, however would call a viral neuritis definite diagnosis as needed.  Patient states that Valium has helped with the worst dizziness, however he does not want to take this or want a refill.  He has a family member who has had similar symptoms with carotid artery stenosis, he wonders if this would be reasonable to try. He also notes that he has had severe somnolence and fatigue lately. He sleeping most of the time when he is not working. He is waking up to eat dinner and then going back to sleep until the following day. Distinct change from his previous state  PMH: Smoking status noted ROS: Per HPI  Objective: BP 125/83   Pulse 88   Temp (!) 97.5 F (36.4 C) (Oral)   Ht 6\' 3"  (1.905 m)   Wt 285 lb 12.8 oz (129.6 kg)   BMI 35.72 kg/m  Gen: NAD, alert, cooperative with exam HEENT: NCAT CV: RRR, good S1/S2, no murmur Resp: CTABL, no wheezes, non-labored Ext: No edema, warm Neuro: Alert and oriented, No gross deficits  Assessment and plan:  # Dizziness Patient with some dizziness with standing, also dizziness at other times. Blood flow limitation is a possible etiology, they have a valid concern, I am glad to order a ultrasound to evaluate the carotid arteries today No bruit on exam. Refer to neurology for their opinion    Orders Placed This Encounter  Procedures  . US Carotid Duplex Bilateral    Standing Status:   Future    Standing Expiration Date:   06/21/2018    Order Specific Question:   Reason for exam:    Answer:   dizziness    Order Specific Question:   Preferred imaging location?    Answer:   [facility]  . Ambulatory referral to Neurology    Referral Priority:   Routine    Referral Type:   Consultation    Referral Reason:   Specialty Services Required    Requested Specialty:   Neurology    Number of Visits  Requested:   [place], MD [place] 04/21/2017, 6:08 PM

## 2017-04-25 ENCOUNTER — Ambulatory Visit (HOSPITAL_COMMUNITY)
Admission: RE | Admit: 2017-04-25 | Discharge: 2017-04-25 | Disposition: A | Discharge status: Home / Self Care | Payer: BLUE CROSS/BLUE SHIELD | Source: Ambulatory Visit | Attending: Family Medicine | Admitting: Family Medicine

## 2017-04-25 DIAGNOSIS — I6523 Occlusion and stenosis of bilateral carotid arteries: Secondary | ICD-10-CM | POA: Insufficient documentation

## 2017-04-25 DIAGNOSIS — R42 Dizziness and giddiness: Secondary | ICD-10-CM

## 2017-05-05 ENCOUNTER — Telehealth: Payer: Self-pay | Admitting: Family Medicine

## 2017-05-05 NOTE — Telephone Encounter (Signed)
Explained when referral was sent and they have 30 days to give an appointment. Gave him the number to [facility] and he will call to try to schedule

## 2017-05-05 NOTE — Telephone Encounter (Signed)
What type of referral do you need? neurologist  Have you been seen at our office for this problem? Yes two weeks ago (If no, schedule them an appointment.  They will need to be seen before a referral can be done.)  Is there a particular doctor or location that you prefer? He has been waiting to hear back about appt and it has been two weeks   Patient notified that referrals can take up to a week or longer to process. If they haven't heard anything within a week they should call back and speak with the referral department.

## 2017-05-08 ENCOUNTER — Ambulatory Visit (INDEPENDENT_AMBULATORY_CARE_PROVIDER_SITE_OTHER): Payer: BLUE CROSS/BLUE SHIELD | Admitting: Neurology

## 2017-05-08 ENCOUNTER — Encounter: Payer: Self-pay | Admitting: Neurology

## 2017-05-08 VITALS — BP 122/79 | HR 89 | Ht 75.0 in | Wt 285.5 lb

## 2017-05-08 DIAGNOSIS — H814 Vertigo of central origin: Secondary | ICD-10-CM

## 2017-05-08 DIAGNOSIS — R42 Dizziness and giddiness: Secondary | ICD-10-CM | POA: Insufficient documentation

## 2017-05-08 DIAGNOSIS — H8149 Vertigo of central origin, unspecified ear: Secondary | ICD-10-CM | POA: Diagnosis not present

## 2017-05-08 DIAGNOSIS — G4489 Other headache syndrome: Secondary | ICD-10-CM | POA: Diagnosis not present

## 2017-05-08 MED ORDER — TOPIRAMATE 25 MG PO TABS: ORAL_TABLET | ORAL | 3 refills | Status: DC

## 2017-05-08 NOTE — Progress Notes (Signed)
Reason for visit: Dizziness, headache  Referring physician: Dr. Susanne Borders William Carson is a 51 y.o. male  History of present illness:  William Carson is a 51 year old right-handed white male with a history of diabetes, COPD, and obesity. The patient noted spontaneous onset of headache and dizziness that began in mid August 2018. The patient indicates that the dizzy sensations vary from true vertigo to lightheaded sensations to rocking sensations. The patient indicates that when his dizziness is present his headaches are better and when the dizziness goes away he will have headaches in the back of the head. He denies any neck pain or neck stiffness. He has no discomfort in the neck whatsoever. The patient reports some occasional blurred vision, no double vision or loss of vision. He has not had any change in hearing or ringing in the ears or ear pain. He reports no numbness or weakness of the face, arms, or legs. When he is dizzy, he may feel somewhat off balance, he has not had any falls. He denies issues controlling the bowels or the bladder. The patient has been seen through ENT, no source of the symptoms were noted. He has undergone MRI of the brain that is completely normal. He has had carotid Doppler studies that were normal. The patient is having symptoms on a daily basis at this point. The severity of his symptoms undulate from one day to the next. He is sent to this office for further evaluation.  Past Medical History:  Diagnosis Date  . Adenomatous polyps   . COPD (chronic obstructive pulmonary disease) ([place])   . Coronary artery disease   . Diabetes mellitus without complication ([place])   . Diverticulosis   . Hemorrhoids   . Hyperlipidemia   . Influenza A 10/20/2015  . Obesity     Past Surgical History:  Procedure Laterality Date  . ANTERIOR CRUCIATE LIGAMENT REPAIR     S/P  . CORONARY STENT PLACEMENT  2011   2 stents    Family History  Problem Relation Age of Onset  .  Heart attack Mother        Infarction  . Arrhythmia Father        Atrial fibrillation  . Heart attack Maternal Grandfather   . Heart attack Paternal Grandfather     Social history:  reports that he quit smoking about 18 months ago. His smoking use included Cigarettes. He has a 45.00 pack-year smoking history. He has quit using smokeless tobacco. He reports that he drinks about 0.6 oz of alcohol per week . He reports that he does not use drugs.  Medications:  Prior to Admission medications   Medication Sig Start Date End Date Taking? Authorizing Provider  albuterol (PROVENTIL HFA;VENTOLIN HFA) 108 (90 Base) MCG/ACT inhaler Inhale 1-2 puffs into the lungs every 6 (six) hours as needed for wheezing or shortness of breath. 10/18/15  Yes Nat Christen, MD  albuterol (PROVENTIL) (2.5 MG/3ML) 0.083% nebulizer solution Take 3 mLs (2.5 mg total) by nebulization every 4 (four) hours as needed for wheezing or shortness of breath. 10/31/15  Yes Timmothy Euler, MD  aspirin 81 MG EC tablet Take 81 mg by mouth daily.     Yes [provider]  budesonide-formoterol (SYMBICORT) 160-4.5 MCG/ACT inhaler Inhale 2 puffs into the lungs 2 (two) times daily. Patient taking differently: Inhale 2 puffs into the lungs daily.  05/07/16  Yes Timmothy Euler, MD  diazepam (VALIUM) 10 MG tablet Take 0.5-1 tablets (5-10 mg  total) by mouth every 8 (eight) hours as needed (dizziness). 03/27/17  Yes Timmothy Euler, MD  glucose blood (ONETOUCH VERIO) test strip Use to check BG up to once daily 05/14/16  Yes Eckard, Tammy, PharmD  HYDROcodone-acetaminophen (NORCO) 5-325 MG tablet Take 1 tablet by mouth every 6 (six) hours as needed for moderate pain. 05/07/16  Yes Timmothy Euler, MD  LIVALO 4 MG TABS TAKE 1 TABLET DAILY Patient taking differently: TAKE 1 TABLET BY MOUTH EVERY OTHER DAY 10/07/16  Yes Timmothy Euler, MD  meclizine (ANTIVERT) 25 MG tablet Take 1 tablet (25 mg total) by mouth 3 (three) times daily  as needed for dizziness. 03/21/17  Yes Timmothy Euler, MD  metoprolol tartrate (LOPRESSOR) 25 MG tablet Take 1 tablet (25 mg total) by mouth 2 (two) times daily. <PLEASE MAKE APPOINTMENT FOR REFILLS> Patient taking differently: Take 25 mg by mouth 2 (two) times daily.  09/09/16  Yes Timmothy Euler, MD  nitroGLYCERIN (NITROSTAT) 0.4 MG SL tablet Place 1 tablet (0.4 mg total) under the tongue every 5 (five) minutes as needed for chest pain. May repeat up to 3 doses. 12/01/15  Yes Timmothy Euler, MD  [facility] DELICA LANCETS 24P MISC Use to check BG up to once daily 05/14/16  Yes Eckard, Tammy, PharmD  oxyCODONE-acetaminophen (ROXICET) 5-325 MG tablet Take 1 tablet by mouth every 8 (eight) hours as needed for severe pain. 12/24/16  Yes Terald Sleeper, PA-C      Allergies  Allergen Reactions  . Crestor [Rosuvastatin Calcium] Other (See Comments)    myalgias  . Lipitor [Atorvastatin] Other (See Comments)    Myalgias   . Other Swelling    Mammelain meat allergy  . Simvastatin Other (See Comments)    Myalgias     ROS:  Out of a complete 14 system review of symptoms, the patient complains only of the following symptoms, and all other reviewed systems are negative.  Spinning sensations Moles Blurred vision Shortness of breath, snoring Achy muscles Allergies Confusion, headache, weakness, dizziness Tumor sleep, racing thoughts  Blood pressure 122/79, pulse 89, height 6\' 3"  (1.905 m), weight 285 lb 8 oz (129.5 kg).  Physical Exam  General: The patient is alert and cooperative at the time of the examination. The patient is markedly obese.  Eyes: Pupils are equal, round, and reactive to light. Discs are flat bilaterally.  Neck: The neck is supple, no carotid bruits are noted.  Respiratory: The respiratory examination is clear.  Cardiovascular: The cardiovascular examination reveals a regular rate and rhythm, no obvious murmurs or rubs are noted.  Skin: Extremities are  without significant edema.  Neurologic Exam  Mental status: The patient is alert and oriented x 3 at the time of the examination. The patient has apparent normal recent and remote memory, with an apparently normal attention span and concentration ability.  Cranial nerves: Facial symmetry is present. There is good sensation of the face to pinprick and soft touch bilaterally. The strength of the facial muscles and the muscles to head turning and shoulder shrug are normal bilaterally. Speech is well enunciated, no aphasia or dysarthria is noted. Extraocular movements are full. Visual fields are full. The tongue is midline, and the patient has symmetric elevation of the soft palate. No obvious hearing deficits are noted.  Motor: The motor testing reveals 5 over 5 strength of all 4 extremities. Good symmetric motor tone is noted throughout.  Sensory: Sensory testing is intact to pinprick, soft touch, vibration sensation,  and position sense on all 4 extremities. No evidence of extinction is noted.  Coordination: Cerebellar testing reveals good finger-nose-finger and heel-to-shin bilaterally.  Gait and station: Gait is normal. Tandem gait is normal. Romberg is negative. No drift is seen.  Reflexes: Deep tendon reflexes are symmetric and normal bilaterally. Toes are downgoing bilaterally.   MRI brain 04/08/17:  IMPRESSION: Normal MRI of the brain.  * MRI scan images were reviewed online. I agree with the written report.   Carotid doppler study 04/25/17:  IMPRESSION: Less than 50% stenosis in the right and left internal carotid arteries.    Assessment/Plan:  1. Dizziness, posterior headache  The patient has multiple risk factors for stroke and cerebrovascular disease although the MRI of the brain was completely normal. The patient will be sent for MRA of the head to exclude posterior circulation abnormalities. He will undergo further blood work today and he will be placed on Topamax for  the headache. He has no prior history of migraine, however. He will follow-up in 3-4 months. We can make dose adjustments of the medication as needed.  Jill Alexanders MD 05/08/2017 3:25 PM  [facility] [address] [place], [place] [address]  Phone [phone] Fax [phone]

## 2017-05-08 NOTE — Patient Instructions (Signed)
   We will get blood work today and get MRA of the head and start Topamax for the headache and dizziness.  Topamax (topiramate) is a seizure medication that has an FDA approval for seizures and for migraine headache. Potential side effects of this medication include weight loss, cognitive slowing, tingling in the fingers and toes, and carbonated drinks will taste bad. If any significant side effects are noted on this drug, please contact our office.

## 2017-05-10 LAB — ANA W/REFLEX: Anti Nuclear Antibody(ANA): NEGATIVE

## 2017-05-10 LAB — SEDIMENTATION RATE: SED RATE: 2 mm/h (ref 0–30)

## 2017-05-10 LAB — ANGIOTENSIN CONVERTING ENZYME: Angio Convert Enzyme: 53 U/L (ref 14–82)

## 2017-05-10 LAB — PAN-ANCA: C-ANCA: <1:20 {titer}

## 2017-05-10 LAB — B. BURGDORFI ANTIBODIES: Lyme IgG/IgM Ab: <0.91 {ISR} (ref 0.00–0.90)

## 2017-05-12 ENCOUNTER — Telehealth: Payer: Self-pay | Admitting: Neurology

## 2017-05-12 ENCOUNTER — Telehealth: Payer: Self-pay | Admitting: Radiology

## 2017-05-12 NOTE — Telephone Encounter (Signed)
I spoke to the patient he wanted the number to reschedule his MRA at [facility].

## 2017-05-12 NOTE — Telephone Encounter (Signed)
I called the patient. The patient is having back pain and ringing in ears on Topamax. These are not typical side effects with this medication, but could be rare side effects. He will stop the medication, if he starts feeling better restart the drug, if this returns we'll have to stop the medication altogether and try something different.

## 2017-05-12 NOTE — Telephone Encounter (Signed)
Pt request call from [place].

## 2017-05-12 NOTE — Telephone Encounter (Signed)
Patient started taking topiramate (TOPAMAX) 25 MG tablet last Friday. Last night had low back pain and his ears started ringing. Could this be coming from medication? Please call  and discuss.

## 2017-05-14 ENCOUNTER — Telehealth: Payer: Self-pay | Admitting: *Deleted

## 2017-05-14 NOTE — Telephone Encounter (Signed)
Called and spoke with pt. Advised labs okay per CW,MD.  Pt stated sx he was having on topamax have started to improve since stopping medication. He will call if he has further questions/concerns.

## 2017-05-16 ENCOUNTER — Ambulatory Visit (HOSPITAL_COMMUNITY)
Admission: RE | Admit: 2017-05-16 | Discharge: 2017-05-16 | Disposition: A | Discharge status: Home / Self Care | Payer: BLUE CROSS/BLUE SHIELD | Source: Ambulatory Visit | Attending: Neurology | Admitting: Neurology

## 2017-05-16 ENCOUNTER — Ambulatory Visit (HOSPITAL_COMMUNITY): Payer: BLUE CROSS/BLUE SHIELD

## 2017-05-16 DIAGNOSIS — H8149 Vertigo of central origin, unspecified ear: Secondary | ICD-10-CM | POA: Insufficient documentation

## 2017-05-16 DIAGNOSIS — G4489 Other headache syndrome: Secondary | ICD-10-CM | POA: Insufficient documentation

## 2017-05-16 DIAGNOSIS — R42 Dizziness and giddiness: Secondary | ICD-10-CM | POA: Diagnosis not present

## 2017-05-16 DIAGNOSIS — H814 Vertigo of central origin: Secondary | ICD-10-CM

## 2017-05-18 ENCOUNTER — Telehealth: Payer: Self-pay | Admitting: Neurology

## 2017-05-18 NOTE — Telephone Encounter (Signed)
I called patient.  The MRA of the head is normal.  The patient has gone off of Topamax, the symptoms have improved, he will restart the drug if the back pain and tinnitus return, we will try something different.    MRA head 05/16/17:  IMPRESSION: Negative intracranial MRA. No evidence of vertebrobasilar insufficiency.

## 2017-05-21 MED ORDER — ZONISAMIDE 25 MG PO CAPS: ORAL_CAPSULE | ORAL | 2 refills | Status: DC

## 2017-05-21 NOTE — Telephone Encounter (Signed)
Pt has called to inform Dr Jannifer Franklin that when he started back on the topiramate (TOPAMAX) 25 MG tablet he is experiencing ringing in his ears again and lower back pain.  Pt is asking for a call

## 2017-05-21 NOTE — Telephone Encounter (Signed)
I called the patient.  The Topamax once again calls back pain and tinnitus, he will stop the medication, I will try Zonegran.

## 2017-05-21 NOTE — Addendum Note (Signed)
Addended by: Kathrynn Ducking on: 05/21/2017 09:15 AM   Modules accepted: Orders

## 2017-05-29 ENCOUNTER — Ambulatory Visit: Payer: BLUE CROSS/BLUE SHIELD | Admitting: Family Medicine

## 2017-05-29 ENCOUNTER — Encounter: Payer: Self-pay | Admitting: Family Medicine

## 2017-05-29 VITALS — BP 132/79 | HR 81 | Temp 97.3°F | Ht 75.0 in | Wt 286.4 lb

## 2017-05-29 DIAGNOSIS — R42 Dizziness and giddiness: Secondary | ICD-10-CM | POA: Diagnosis not present

## 2017-05-29 DIAGNOSIS — Z23 Encounter for immunization: Secondary | ICD-10-CM | POA: Diagnosis not present

## 2017-05-29 MED ORDER — DIAZEPAM 10 MG PO TABS: 5.0000 mg | ORAL_TABLET | Freq: Three times a day (TID) | ORAL | 1 refills | Status: DC | PRN

## 2017-05-29 NOTE — Patient Instructions (Signed)
Great to see you!   

## 2017-05-29 NOTE — Progress Notes (Signed)
   HPI  Patient presents today with dizziness.  Patient has persistent dizziness despite multiple interventions and evaluations. He feels well overall and has no other complaints.  Patient has history of COPD, he uses Symbicort once a day. He states that the dizziness does get worse with exertion.  He was recently tried with Topamax which caused back pain and left ear tenderness, this improved after stopping, he restarted to make sure it was really the cause and has had some residual left ear tenderness since that time.  PMH: Smoking status noted ROS: Per HPI  Objective: BP 132/79   Pulse 81   Temp (!) 97.3 F (36.3 C) (Oral)   Ht 6\' 3"  (1.905 m)   Wt 286 lb 6.4 oz (129.9 kg)   SpO2 93%   BMI 35.80 kg/m  Gen: NAD, alert, cooperative with exam HEENT: NCAT CV: RRR, good S1/S2, no murmur Resp: CTABL, no wheezes, non-labored Ext: No edema, warm Neuro: Alert and oriented, No gross deficits  O2 saturation 93-94% with walking in the clinic on room air  Assessment and plan:  #Dizziness Difficult situation, has been started on Zonegran by neurology Patient has had mild relief with Valium and has used its sparingly, refilled today. He would like to consider seeing another specialist for another opinion.  We will consider this for a few days.  I may send him to another primary care physician for their opinion and a fresh set of eyes to review the case   Orders Placed This Encounter  Procedures  . Flu Vaccine QUAD 36+ mos IM    Meds ordered this encounter  Medications  . diazepam (VALIUM) 10 MG tablet    Sig: Take 0.5-1 tablets (5-10 mg total) every 8 (eight) hours as needed by mouth (dizziness).    Dispense:  30 tablet    Refill:  [place], MD [facility] 05/29/2017, 5:25 PM

## 2017-06-05 DIAGNOSIS — H903 Sensorineural hearing loss, bilateral: Secondary | ICD-10-CM | POA: Diagnosis not present

## 2017-06-05 DIAGNOSIS — H9312 Tinnitus, left ear: Secondary | ICD-10-CM | POA: Diagnosis not present

## 2017-06-05 DIAGNOSIS — H8143 Vertigo of central origin, bilateral: Secondary | ICD-10-CM | POA: Diagnosis not present

## 2017-06-10 MED ORDER — DIVALPROEX SODIUM 250 MG PO DR TAB: DELAYED_RELEASE_TABLET | ORAL | 3 refills | Status: DC

## 2017-06-10 NOTE — Addendum Note (Signed)
Addended by: Kathrynn Ducking on: 06/10/2017 04:52 PM   Modules accepted: Orders

## 2017-06-10 NOTE — Telephone Encounter (Signed)
Pt called he is having HA's, tingling in the hands, don't feel good since starting zonegran. Sometimes he gets unsteady on his feet. Please call to advise.

## 2017-06-25 ENCOUNTER — Ambulatory Visit: Payer: BLUE CROSS/BLUE SHIELD | Admitting: Diagnostic Neuroimaging

## 2017-06-27 DIAGNOSIS — E119 Type 2 diabetes mellitus without complications: Secondary | ICD-10-CM | POA: Diagnosis not present

## 2017-06-27 DIAGNOSIS — H31092 Other chorioretinal scars, left eye: Secondary | ICD-10-CM | POA: Diagnosis not present

## 2017-06-27 DIAGNOSIS — H43823 Vitreomacular adhesion, bilateral: Secondary | ICD-10-CM | POA: Diagnosis not present

## 2017-06-27 DIAGNOSIS — H2513 Age-related nuclear cataract, bilateral: Secondary | ICD-10-CM | POA: Diagnosis not present

## 2017-06-27 LAB — HM DIABETES EYE EXAM

## 2017-07-07 ENCOUNTER — Other Ambulatory Visit: Payer: Self-pay | Admitting: Family Medicine

## 2017-07-23 DIAGNOSIS — W57XXXS Bitten or stung by nonvenomous insect and other nonvenomous arthropods, sequela: Secondary | ICD-10-CM | POA: Diagnosis not present

## 2017-07-23 DIAGNOSIS — Z6835 Body mass index (BMI) 35.0-35.9, adult: Secondary | ICD-10-CM | POA: Diagnosis not present

## 2017-07-23 DIAGNOSIS — Z91018 Allergy to other foods: Secondary | ICD-10-CM | POA: Diagnosis not present

## 2017-07-23 DIAGNOSIS — Z8709 Personal history of other diseases of the respiratory system: Secondary | ICD-10-CM | POA: Diagnosis not present

## 2017-07-24 ENCOUNTER — Ambulatory Visit: Payer: BLUE CROSS/BLUE SHIELD | Admitting: Family Medicine

## 2017-07-24 ENCOUNTER — Encounter: Payer: Self-pay | Admitting: Family Medicine

## 2017-07-24 VITALS — BP 122/80 | HR 67 | Temp 96.6°F | Ht 75.0 in | Wt 286.8 lb

## 2017-07-24 DIAGNOSIS — R42 Dizziness and giddiness: Secondary | ICD-10-CM | POA: Diagnosis not present

## 2017-07-24 DIAGNOSIS — E119 Type 2 diabetes mellitus without complications: Secondary | ICD-10-CM

## 2017-07-24 DIAGNOSIS — J438 Other emphysema: Secondary | ICD-10-CM | POA: Diagnosis not present

## 2017-07-24 LAB — BAYER DCA HB A1C WAIVED: HB A1C (BAYER DCA - WAIVED): 8.3 % — ABNORMAL HIGH (ref <7.0)

## 2017-07-24 NOTE — Progress Notes (Signed)
   HPI  Patient presents today here for follow-up chronic medical conditions.  Patient had a recent flulike illness which is improving, his breathing is improving but not back to baseline.  He continues to have dizziness, he has recently seen an allergist in [place], Dr. Maisie Fus, he is starting a prednisone course today to try to treat chronic sinusitis which could be is because of dizziness.  He did not tolerate Zonegran.  Type 2 diabetes Average fasting blood sugar 130s. Watching diet moderately. Occupationally active but no formal exercise routine  PMH: Smoking status noted ROS: Per HPI  Objective: BP 122/80   Pulse 67   Temp (!) 96.6 F (35.9 C) (Oral)   Ht 6\' 3"  (1.905 m)   Wt 286 lb 12.8 oz (130.1 kg)   BMI 35.85 kg/m  Gen: NAD, alert, cooperative with exam HEENT: NCAT CV: RRR, good S1/S2, no murmur Resp: CTABL, no wheezes, non-labored Ext: No edema, warm Neuro: Alert and oriented, No gross deficits  Assessment and plan:  #Dizziness Unclear etiology, possibly sinus related, patient is starting short course of prednisone to see if he has improvement. Appreciate allergy and immunology's assistance  #Type 2 diabetes Expect good control, A1c pending, fasting blood sugars controlled Next discussed redness and effect on blood sugar control  #COPD Breathing not quite at baseline with recent illness, however restarting a short course of prednisone which will help.  He is improving. Continue Symbicort   Orders Placed This Encounter  Procedures  . [facility] Hb A1c Carney Bern, MD [facility] 07/24/2017, 8:24 AM

## 2017-07-24 NOTE — Patient Instructions (Signed)
Great to see you!   

## 2017-08-14 ENCOUNTER — Other Ambulatory Visit: Payer: Self-pay | Admitting: Family Medicine

## 2017-08-14 DIAGNOSIS — J439 Emphysema, unspecified: Secondary | ICD-10-CM

## 2017-08-15 ENCOUNTER — Other Ambulatory Visit: Payer: Self-pay | Admitting: *Deleted

## 2017-08-15 MED ORDER — ALBUTEROL SULFATE HFA 108 (90 BASE) MCG/ACT IN AERS: 1.0000 | INHALATION_SPRAY | Freq: Four times a day (QID) | RESPIRATORY_TRACT | 2 refills | Status: DC | PRN

## 2017-08-27 DIAGNOSIS — R0602 Shortness of breath: Secondary | ICD-10-CM | POA: Diagnosis not present

## 2017-08-27 DIAGNOSIS — J3089 Other allergic rhinitis: Secondary | ICD-10-CM | POA: Diagnosis not present

## 2017-08-27 DIAGNOSIS — R21 Rash and other nonspecific skin eruption: Secondary | ICD-10-CM | POA: Diagnosis not present

## 2017-08-27 DIAGNOSIS — Z91018 Allergy to other foods: Secondary | ICD-10-CM | POA: Diagnosis not present

## 2017-09-02 ENCOUNTER — Telehealth: Payer: Self-pay | Admitting: Cardiology

## 2017-09-02 MED ORDER — METOPROLOL TARTRATE 25 MG PO TABS: 25.0000 mg | ORAL_TABLET | Freq: Two times a day (BID) | ORAL | 3 refills | Status: DC

## 2017-09-02 NOTE — Telephone Encounter (Signed)
Pt needs refills till appt in may 28th  LOPRESSOR  25mg  to pharm he is out.

## 2017-09-08 ENCOUNTER — Encounter: Payer: Self-pay | Admitting: Neurology

## 2017-09-08 ENCOUNTER — Ambulatory Visit: Payer: BLUE CROSS/BLUE SHIELD | Admitting: Neurology

## 2017-09-08 VITALS — BP 132/78 | HR 82 | Ht 75.0 in | Wt 281.5 lb

## 2017-09-08 DIAGNOSIS — R42 Dizziness and giddiness: Secondary | ICD-10-CM | POA: Diagnosis not present

## 2017-09-08 DIAGNOSIS — G4489 Other headache syndrome: Secondary | ICD-10-CM

## 2017-09-08 NOTE — Patient Instructions (Signed)
   We will try vestibular rehabilitation.

## 2017-09-08 NOTE — Progress Notes (Signed)
Reason for visit: Dizziness  William Carson is an 52 y.o. male  History of present illness:  William Carson is a 52 year old right-handed white male with a history of diabetes and coronary artery disease and COPD.  The patient developed sudden onset of dizziness that began in mid August 2018.  The patient may have true vertigo or just a lightheaded sensation or rocking sensation at times.  He was having some headaches initially, the headaches have gone away at this time.  He was given a trial on Topamax and Zonegran but he could not tolerate these medications.  The patient had MRI of the brain that was normal, MRA of the head was normal, a carotid Doppler study was unremarkable.  The patient has taken meclizine with some benefit, he believes that diazepam seems to work better.  The patient has tried to come off of most of his medications at one point or the other to see if this was the source of the dizziness, this did not make any difference.  The dizziness is present with sitting, lying down, and standing.  He indicates that if he is active, and bending and stooping quite a bit, this will increase the dizziness.  He denies any nausea or vomiting.  He returns for an evaluation.   Past Medical History:  Diagnosis Date  . Adenomatous polyps   . COPD (chronic obstructive pulmonary disease) ([place])   . Coronary artery disease   . Diabetes mellitus without complication ([place])   . Diverticulosis   . Hemorrhoids   . Hyperlipidemia   . Influenza A 10/20/2015  . Obesity     Past Surgical History:  Procedure Laterality Date  . ANTERIOR CRUCIATE LIGAMENT REPAIR     S/P  . CORONARY STENT PLACEMENT  2011   2 stents    Family History  Problem Relation Age of Onset  . Heart attack Mother        Infarction  . Arrhythmia Father        Atrial fibrillation  . Heart attack Maternal Grandfather   . Heart attack Paternal Grandfather     Social history:  reports that he quit smoking about 22 months  ago. His smoking use included cigarettes. He has a 45.00 pack-year smoking history. He has quit using smokeless tobacco. He reports that he drinks about 0.6 oz of alcohol per week. He reports that he does not use drugs.    Allergies  Allergen Reactions  . Crestor [Rosuvastatin Calcium] Other (See Comments)    myalgias  . Depakote [Divalproex Sodium]     Stomach pains  . Lipitor [Atorvastatin] Other (See Comments)    Myalgias   . Other Swelling    Mammelain meat allergy  . Simvastatin Other (See Comments)    Myalgias   . Topamax [Topiramate]     Back pain, tinnitus  . Zonegran [Zonisamide]     Increased headache    Medications:  Prior to Admission medications   Medication Sig Start Date End Date Taking? Authorizing Provider  albuterol (PROVENTIL HFA;VENTOLIN HFA) 108 (90 Base) MCG/ACT inhaler Inhale 1-2 puffs into the lungs every 6 (six) hours as needed for wheezing or shortness of breath. 08/15/17  Yes Timmothy Euler, MD  albuterol (PROVENTIL) (2.5 MG/3ML) 0.083% nebulizer solution Take 3 mLs (2.5 mg total) by nebulization every 4 (four) hours as needed for wheezing or shortness of breath. 10/31/15  Yes Timmothy Euler, MD  aspirin 81 MG EC tablet Take 81 mg by  mouth daily.     Yes [provider]  diazepam (VALIUM) 10 MG tablet Take 0.5-1 tablets (5-10 mg total) every 8 (eight) hours as needed by mouth (dizziness). 05/29/17  Yes Timmothy Euler, MD  glucose blood (ONETOUCH VERIO) test strip Use to check BG up to once daily 05/14/16  Yes Eckard, Tammy, PharmD  HYDROcodone-acetaminophen (NORCO) 5-325 MG tablet Take 1 tablet by mouth every 6 (six) hours as needed for moderate pain. 05/07/16  Yes Timmothy Euler, MD  LIVALO 4 MG TABS TAKE 1 TABLET DAILY 08/15/17  Yes Timmothy Euler, MD  meclizine (ANTIVERT) 25 MG tablet Take 1 tablet (25 mg total) by mouth 3 (three) times daily as needed for dizziness. 07/08/17  Yes Timmothy Euler, MD  metoprolol tartrate  (LOPRESSOR) 25 MG tablet Take 1 tablet (25 mg total) by mouth 2 (two) times daily. <PLEASE MAKE APPOINTMENT FOR REFILLS> 09/02/17  Yes Crenshaw, Denice Bors, MD  nitroGLYCERIN (NITROSTAT) 0.4 MG SL tablet Place 1 tablet (0.4 mg total) under the tongue every 5 (five) minutes as needed for chest pain. May repeat up to 3 doses. 12/01/15  Yes Timmothy Euler, MD  [facility] DELICA LANCETS 92E MISC Use to check BG up to once daily 05/14/16  Yes Eckard, Tammy, PharmD  oxyCODONE-acetaminophen (ROXICET) 5-325 MG tablet Take 1 tablet by mouth every 8 (eight) hours as needed for severe pain. 12/24/16  Yes Terald Sleeper, PA-C  SYMBICORT 160-4.5 MCG/ACT inhaler Inhale 2 puffs into the lungs 2 (two) times daily. 08/15/17  Yes Timmothy Euler, MD    ROS:  Out of a complete 14 system review of symptoms, the patient complains only of the following symptoms, and all other reviewed systems are negative.  Ringing in the ears Joint pain Dizziness  Blood pressure 132/78, pulse 82, height 6\' 3"  (1.905 m), weight 281 lb 8 oz (127.7 kg).  Physical Exam  General: The patient is alert and cooperative at the time of the examination.  The patient is markedly obese.  Skin: No significant peripheral edema is noted.   Neurologic Exam  Mental status: The patient is alert and oriented x 3 at the time of the examination. The patient has apparent normal recent and remote memory, with an apparently normal attention span and concentration ability.   Cranial nerves: Facial symmetry is present. Speech is normal, no aphasia or dysarthria is noted. Extraocular movements are full. Visual fields are full.  Motor: The patient has good strength in all 4 extremities.  Sensory examination: Soft touch sensation is symmetric on the face, arms, and legs.  Coordination: The patient has good finger-nose-finger and heel-to-shin bilaterally. With the Nyan-Barrany procedure, the patient does have horizontal and rotatory nystagmus that is  present when looking to the right and to the left, does not seem to change with any particular head position.  Gait and station: The patient has a normal gait. Tandem gait is normal. Romberg is negative. No drift is seen.  Reflexes: Deep tendon reflexes are symmetric.   Assessment/Plan:  1.  Dizziness, vertigo  The source of the dizziness is not clear.  MRI of the brain was completely normal, cerebrovascular circulation is unremarkable.  The patient does not have significant sinus disease.  We will send him for vestibular rehabilitation, it is possible that the source of the dizziness is still central in nature.  I fear that this may be a chronic ongoing condition for him.  He will follow-up in 6 months.  C.  Floyde Parkins MD 09/08/2017 3:14 PM  [facility] [address] [place], [place] [address]  Phone [phone] Fax [phone]

## 2017-09-19 ENCOUNTER — Other Ambulatory Visit: Payer: Self-pay

## 2017-09-19 ENCOUNTER — Encounter: Payer: Self-pay | Admitting: Physical Therapy

## 2017-09-19 ENCOUNTER — Ambulatory Visit: Payer: BLUE CROSS/BLUE SHIELD | Attending: Neurology | Admitting: Physical Therapy

## 2017-09-19 DIAGNOSIS — R42 Dizziness and giddiness: Secondary | ICD-10-CM

## 2017-09-19 NOTE — Therapy (Signed)
[facility] [address] [place], [place], [address] Phone: [phone]   Fax:  [phone]  Physical Therapy Evaluation  Patient Details  Name: William Carson MRN: [id number] Date of Birth: [birth year] No Data Recorded  Encounter Date: 09/19/2017  PT End of Session - 09/19/17 1757    Visit Number  1    Number of Visits  1    Authorization Type  BCBS    PT Start Time  1025    PT Stop Time  1110    PT Time Calculation (min)  45 min    Activity Tolerance  Patient tolerated treatment well    Behavior During Therapy  [facility] for tasks assessed/performed       Past Medical History:  Diagnosis Date  . Adenomatous polyps   . COPD (chronic obstructive pulmonary disease) ([place])   . Coronary artery disease   . Diabetes mellitus without complication ([place])   . Diverticulosis   . Hemorrhoids   . Hyperlipidemia   . Influenza A 10/20/2015  . Obesity     Past Surgical History:  Procedure Laterality Date  . ANTERIOR CRUCIATE LIGAMENT REPAIR     S/P  . CORONARY STENT PLACEMENT  2011   2 stents    There were no vitals filed for this visit.   Subjective Assessment - 09/19/17 1026    Subjective  The meclizine sometimes helps and sometimes not. It began with severe headaches and dizziness in August 2018. The bad headaches went away after a few weeks, but the dizziness never did. I've seen an ENT and they said there was nothing they could see that was causing dizziness. They checked my hearing and it was slightly less on the right, but she thought that was because I used to shoot a rifle. I haven't been back to the ENT since the ringing in my left ear started. I know it was that medicine they put me on. It was one of the side effects listed and I stopped taking it when the ringing started. I've had an MRI, an MRA and nothing has explained why I feel this way.     Patient is accompained by:  Family member wife    Pertinent History  DM,  CAD, COPD, obesity    Diagnostic tests  MRI, MRA, carotid dopplers normal (saw ENT and reports mild loss of hearing in his right ear; denies caloric testing or rotary chair testing)    Patient Stated Goals  Stop feeling dizzy and stop the ringing in my ear.     Currently in Pain?  No/denies         [facility] PT Assessment - 09/19/17 0001      Balance Screen   Has the patient fallen in the past 6 months  No    Has the patient had a decrease in activity level because of a fear of falling?   Yes    Is the patient reluctant to leave their home because of a fear of falling?   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         Vestibular Assessment - 09/19/17 1027      Vestibular Assessment   General Observation  started with really bad headaches, then vertigo started; then headaches  stopped and vertigo has persisted;       Symptom Behavior   Type of Dizziness  Spinning drunk     Frequency of Dizziness  every day    Duration of Dizziness  most of the time it's all day; worse in the evenings;     Aggravating Factors  Forward bending;Sit to stand moving/activity    Relieving Factors  No known relieving factors sleep      Occulomotor Exam   Occulomotor Alignment  Normal    Spontaneous  Absent    Gaze-induced  Absent    Head shaking Horizontal  --    Head Shaking Vertical  --    Smooth Pursuits  Intact    Saccades  Intact      Vestibulo-Occular Reflex   VOR 1 Head Only (x 1 viewing)  eyes on target for 30 seconds, however + drunk feeling    Comment  HIT negative bil; +reproduced symptoms ( went from 4 to 6/10      Visual Acuity   Static  10    Dynamic  7      Auditory   Comments  ringing in left ear started when started new med (can't remember name of med)      Other Tests   Tragal  negative bil; no nystagmus, no change in  symptoms    Comments  Valsalva +increase in symptoms to 7/10         Objective measurements completed on examination: See above findings.              PT Education - 09/19/17 1754    Education provided  Yes    Education Details  +valsalva maneuver can be indicative of superior canal dehiscence. Would need MD to order test to truly diagnose (?high contrast CT of inner ear/temporal bone; ?VIMP). Educated to follow-up with Dr Jannifer Franklin to see if he will order tests. Also given name of ENT at [facility] (Dr. May) and Dr. Cresenciano Lick (in [place]) that were recommended by a colleague because they do a lot of vestibular/inner ear work.    Person(s) Educated  Patient;Spouse    Methods  Explanation;Handout    Comprehension  Verbalized understanding                  Plan - 09/19/17 1900    Clinical Impression Statement  Patient presented for vestibular evaluation due to chronic dizziness and vertigo. Results of testing found valsalva maneuver reproduced symptoms with increase from 4 out of 10 dizziness to 7 out of 10. Dizziness/vertigo elicited by the valsalva maneuver is typically due to pressure changes in the inner ear (as can occur with superior canal dehiscence). Patient's symptoms were also elicited (but to a lesser intensity) with Head Impulse Test (+to the right) and visual acuity test (3 line difference from static visual acuity). These are likely incidental findings with +valsalva maneuver being the most important/significant finding. Patient and wife educated on need for physician to order tests of inner ear/temporal bone to diagnose cause of his vertigo. Currently vestibular rehab would not be appropriate based on his testing results. Educated that once diagnosis is concluded, there may be a role for vestibular rehabilitation. No further vestibular rehab indicated with no goals or plan of care established.     History and Personal Factors relevant to plan of care:  Negative  examination by ENT, negative MRI, negative MRA, negative carotid dopplers    Clinical Presentation  Unstable    Clinical Presentation due  to:  Unknown diagnosis; +valsalva maneuver reproducing vertigo    Clinical Decision Making  High    Recommended Other Services  follow up with Dr. Jannifer Franklin re: further testing related to ?superior canal dehiscence    Consulted and Agree with Plan of Care  Patient;Family member/caregiver    Family Member Consulted  wife       Patient will benefit from skilled therapeutic intervention in order to improve the following deficits and impairments:     Visit Diagnosis: Dizziness and giddiness - Plan: PT plan of care cert/re-cert     Problem List Patient Active Problem List   Diagnosis Date Noted  . Dizziness 05/08/2017  . Headache syndrome 05/08/2017  . History of diverticulitis 12/24/2016  . History of kidney stones 12/24/2016  . T2DM (type 2 diabetes mellitus) ([place]) 05/13/2016  . Allergy to meat 05/07/2016  . Chronic left-sided low back pain with left-sided sciatica 05/07/2016  . Respiratory failure with hypoxia and hypercapnia ([place]) 10/20/2015  . Obesity (BMI 30-39.9) 08/04/2015  . Low serum vitamin D 04/12/2015  . Tobacco abuse 01/18/2011  . Hyperlipidemia 07/18/2010  . Obesity 07/18/2010  . CAD S/P percutaneous coronary angioplasty 07/18/2010  . COPD (chronic obstructive pulmonary disease) ([place]) 07/18/2010    Rexanne Mano, PT 09/19/2017, 7:19 PM  [facility] [address] [place], [place], [address] Phone: [phone]   Fax:  [phone]  Name: William Carson MRN: [id number] Date of Birth: [birth year]

## 2017-09-22 ENCOUNTER — Telehealth: Payer: Self-pay | Admitting: Family Medicine

## 2017-09-22 DIAGNOSIS — R42 Dizziness and giddiness: Secondary | ICD-10-CM

## 2017-09-22 NOTE — Telephone Encounter (Signed)
Mr. Pasillas said he had gone to see the neurologist for his dizziness who recommended he see physical therapy.  The physical therapist said she had seen something like this before and thought it may be vestibular dehiscence causing his dizziness.  She recommended he be referred to ENT at [facility] for diagnosis and treatment.  Patient said the fax number to send this referral to is [phone].  Please advise.

## 2017-09-22 NOTE — Telephone Encounter (Signed)
Pt aware by VM  

## 2017-09-22 NOTE — Telephone Encounter (Signed)
I am happy to refer him to [facility] [place] ENT, will place referral. Consider Vestibular dehiscence.   Laroy Apple, MD [facility] 09/22/2017, 3:00 PM

## 2017-09-22 NOTE — Telephone Encounter (Signed)
Pt needs referral to ENT @ [facility] Wants to Speak With Wendi Snipes. Please advise

## 2017-11-11 DIAGNOSIS — R42 Dizziness and giddiness: Secondary | ICD-10-CM | POA: Diagnosis not present

## 2017-11-11 DIAGNOSIS — G43109 Migraine with aura, not intractable, without status migrainosus: Secondary | ICD-10-CM | POA: Diagnosis not present

## 2017-11-11 DIAGNOSIS — H905 Unspecified sensorineural hearing loss: Secondary | ICD-10-CM | POA: Diagnosis not present

## 2017-11-21 ENCOUNTER — Encounter: Payer: Self-pay | Admitting: Family Medicine

## 2017-11-21 ENCOUNTER — Ambulatory Visit: Payer: BLUE CROSS/BLUE SHIELD | Admitting: Family Medicine

## 2017-11-21 VITALS — BP 149/78 | HR 58 | Temp 97.0°F | Ht 75.0 in | Wt 276.4 lb

## 2017-11-21 DIAGNOSIS — G8929 Other chronic pain: Secondary | ICD-10-CM | POA: Diagnosis not present

## 2017-11-21 DIAGNOSIS — R42 Dizziness and giddiness: Secondary | ICD-10-CM | POA: Diagnosis not present

## 2017-11-21 DIAGNOSIS — E7800 Pure hypercholesterolemia, unspecified: Secondary | ICD-10-CM

## 2017-11-21 DIAGNOSIS — E119 Type 2 diabetes mellitus without complications: Secondary | ICD-10-CM

## 2017-11-21 DIAGNOSIS — M5442 Lumbago with sciatica, left side: Secondary | ICD-10-CM | POA: Diagnosis not present

## 2017-11-21 LAB — BAYER DCA HB A1C WAIVED: HB A1C (BAYER DCA - WAIVED): 10 % — ABNORMAL HIGH (ref <7.0)

## 2017-11-21 MED ORDER — OXYCODONE-ACETAMINOPHEN 5-325 MG PO TABS: 1.0000 | ORAL_TABLET | Freq: Three times a day (TID) | ORAL | 0 refills | Status: DC | PRN

## 2017-11-21 MED ORDER — SITAGLIP PHOS-METFORMIN HCL ER 100-1000 MG PO TB24: 1.0000 | ORAL_TABLET | Freq: Every day | ORAL | 5 refills | Status: DC

## 2017-11-21 NOTE — Progress Notes (Signed)
HPI  Patient presents today for follow-up chronic medical conditions.  Type 2 diabetes Patient states his blood sugars are very uncontrolled, he still watching his diet just like previous, however since his course of prednisone a few months ago he has had very much difficulty controlling his blood sugars. He would like to start a medication.  Patient states that he would like a refill of Percocet, he had 20 pills prescribed about 1 year ago which he is used intermittently for bad back pain.  Dizziness Patient states that he is recently been seen by [facility] neurologist, I have reviewed the note.  The neurologist feels that he has migraine syndrome causing his dizziness.  He started on betahistine with no improvement after about 1 week.  Hyperlipidemia Fasting Using Livalo every other day Tolerating well.   PMH: Smoking status noted ROS: Per HPI  Objective: BP (!) 149/78   Pulse (!) 58   Temp (!) 97 F (36.1 C) (Oral)   Ht 6' 3" (1.905 m)   Wt 276 lb 6.4 oz (125.4 kg)   BMI 34.55 kg/m  Gen: NAD, alert, cooperative with exam HEENT: NCAT CV: RRR, good S1/S2, no murmur Resp: CTABL, no wheezes, non-labored Ext: No edema, warm Neuro: Alert and oriented, No gross deficits  Assessment and plan:  #Type 2 diabetes Likely very uncontrolled, A1c was 8.3 last time, he has had worsening of his sugar since then Starting Janumet XR 100/1000 He has normal renal function  #Dizziness Felt to be migraine syndrome, he has been started on betahistine by [facility] neurology  #Hyperlipidemia Continue Livalo every other day, lipids today fasting  #Chronic left-sided low back pain Patient with intermittent back pain, he is used 20 Percocet in the last year, I have refilled this today.    Orders Placed This Encounter  Procedures  . Microalbumin / creatinine urine ratio  . CMP14+EGFR  . CBC with Differential/Platelet  . Lipid panel  . TSH  . Bayer DCA Hb A1c Waived    Meds ordered  this encounter  Medications  . oxyCODONE-acetaminophen (ROXICET) 5-325 MG tablet    Sig: Take 1 tablet by mouth every 8 (eight) hours as needed for severe pain.    Dispense:  20 tablet    Refill:  0  . SitaGLIPtin-MetFORMIN HCl (JANUMET XR) [phone] MG TB24    Sig: Take 1 tablet by mouth daily.    Dispense:  30 tablet    Refill:  [place], MD [place] 11/21/2017, 8:22 AM

## 2017-11-21 NOTE — Patient Instructions (Signed)
Great to see you!  Come back to see William Carson in 3 months

## 2017-11-22 LAB — LIPID PANEL
CHOL/HDL RATIO: 4.2 ratio (ref 0.0–5.0)
Cholesterol, Total: 146 mg/dL (ref 100–199)
HDL: 35 mg/dL — AB (ref >39)
LDL CALC: 95 mg/dL (ref 0–99)
TRIGLYCERIDES: 79 mg/dL (ref 0–149)
VLDL Cholesterol Cal: 16 mg/dL (ref 5–40)

## 2017-11-22 LAB — CMP14+EGFR
ALBUMIN: 4.2 g/dL (ref 3.5–5.5)
ALK PHOS: 45 IU/L (ref 39–117)
ALT: 73 IU/L — ABNORMAL HIGH (ref 0–44)
AST: 41 IU/L — ABNORMAL HIGH (ref 0–40)
Albumin/Globulin Ratio: 2.2 (ref 1.2–2.2)
BUN / CREAT RATIO: 12 (ref 9–20)
BUN: 8 mg/dL (ref 6–24)
Bilirubin Total: 0.5 mg/dL (ref 0.0–1.2)
CALCIUM: 9.1 mg/dL (ref 8.7–10.2)
CO2: 22 mmol/L (ref 20–29)
CREATININE: 0.69 mg/dL — AB (ref 0.76–1.27)
Chloride: 104 mmol/L (ref 96–106)
GFR calc Af Amer: 125 mL/min/{1.73_m2} (ref >59)
GFR calc non Af Amer: 108 mL/min/{1.73_m2} (ref >59)
GLOBULIN, TOTAL: 1.9 g/dL (ref 1.5–4.5)
Glucose: 210 mg/dL — ABNORMAL HIGH (ref 65–99)
Potassium: 4.5 mmol/L (ref 3.5–5.2)
SODIUM: 139 mmol/L (ref 134–144)
Total Protein: 6.1 g/dL (ref 6.0–8.5)

## 2017-11-22 LAB — MICROALBUMIN / CREATININE URINE RATIO
CREATININE, UR: 170 mg/dL
MICROALB/CREAT RATIO: 5.4 mg/g{creat} (ref 0.0–30.0)
Microalbumin, Urine: 9.1 ug/mL

## 2017-11-22 LAB — CBC WITH DIFFERENTIAL/PLATELET
BASOS: 1 %
Basophils Absolute: 0.1 10*3/uL (ref 0.0–0.2)
EOS (ABSOLUTE): 0.2 10*3/uL (ref 0.0–0.4)
EOS: 3 %
HEMATOCRIT: 44.6 % (ref 37.5–51.0)
Hemoglobin: 14.6 g/dL (ref 13.0–17.7)
IMMATURE GRANULOCYTES: 0 %
Immature Grans (Abs): 0 10*3/uL (ref 0.0–0.1)
LYMPHS ABS: 1.7 10*3/uL (ref 0.7–3.1)
Lymphs: 27 %
MCH: 30.4 pg (ref 26.6–33.0)
MCHC: 32.7 g/dL (ref 31.5–35.7)
MCV: 93 fL (ref 79–97)
MONOS ABS: 0.7 10*3/uL (ref 0.1–0.9)
Monocytes: 12 %
NEUTROS PCT: 57 %
Neutrophils Absolute: 3.5 10*3/uL (ref 1.4–7.0)
Platelets: 244 10*3/uL (ref 150–379)
RBC: 4.8 x10E6/uL (ref 4.14–5.80)
RDW: 14.1 % (ref 12.3–15.4)
WBC: 6.2 10*3/uL (ref 3.4–10.8)

## 2017-11-22 LAB — TSH: TSH: 2.78 u[IU]/mL (ref 0.450–4.500)

## 2017-12-09 ENCOUNTER — Other Ambulatory Visit: Payer: Self-pay | Admitting: Cardiology

## 2017-12-10 ENCOUNTER — Other Ambulatory Visit: Payer: Self-pay | Admitting: Family Medicine

## 2017-12-10 NOTE — Progress Notes (Signed)
HPI: FU coronary artery disease. Admitted in November of 2011 and ruled in for a subendocardial myocardial infarctions. Cardiac catheterization performed on June 19, 2010 revealed normal LM; 30% proximal LAD; normal Lcx; 90% RCA; normal EF; patient had 2 drug-eluting stents to the right coronary artery at that time. Patient is intolerant to most statins but now taking livalo.   Exercise treadmill June 2018 normal.  Carotid Dopplers October 2018 showed less than 50% right and left internal carotid artery stenosis.  Since I last saw him, the patient denies any dyspnea on exertion, orthopnea, PND, pedal edema, palpitations, syncope or chest pain.    Current Outpatient Medications  Medication Sig Dispense Refill  . albuterol (PROVENTIL HFA;VENTOLIN HFA) 108 (90 Base) MCG/ACT inhaler Inhale 1-2 puffs into the lungs every 6 (six) hours as needed for wheezing or shortness of breath. 1 Inhaler 2  . albuterol (PROVENTIL) (2.5 MG/3ML) 0.083% nebulizer solution Take 3 mLs (2.5 mg total) by nebulization every 4 (four) hours as needed for wheezing or shortness of breath. 150 mL 1  . aspirin 81 MG EC tablet Take 81 mg by mouth daily.      . Betahistine HCl (BETAHISTINE DIHYDROCHLORIDE) POWD 24mg  tablet PO twice a day    . diazepam (VALIUM) 10 MG tablet Take 0.5-1 tablets (5-10 mg total) every 8 (eight) hours as needed by mouth (dizziness). 30 tablet 1  . glucose blood (ONETOUCH VERIO) test strip Use to check BG up to once daily 100 each 2  . LIVALO 4 MG TABS TAKE 1 TABLET DAILY 30 tablet 5  . meclizine (ANTIVERT) 25 MG tablet Take 1 tablet (25 mg total) by mouth 3 (three) times daily as needed for dizziness. 30 tablet 3  . metoprolol tartrate (LOPRESSOR) 25 MG tablet TAKE (1) TABLET TWICE A DAY. NEED TO SCHEDULE APPOINTMENT 60 tablet 1  . nitroGLYCERIN (NITROSTAT) 0.4 MG SL tablet Place 1 tablet (0.4 mg total) under the tongue every 5 (five) minutes as needed for chest pain. May repeat up to 3 doses.  25 tablet 3  . ONETOUCH DELICA LANCETS 27N MISC Use to check BG up to once daily 100 each 2  . oxyCODONE-acetaminophen (ROXICET) 5-325 MG tablet Take 1 tablet by mouth every 8 (eight) hours as needed for severe pain. 20 tablet 0  . SitaGLIPtin-MetFORMIN HCl (JANUMET XR) [phone] MG TB24 Take 1 tablet by mouth daily. 30 tablet 5  . SYMBICORT 160-4.5 MCG/ACT inhaler Inhale 2 puffs into the lungs 2 (two) times daily. 10.2 g 5   No current facility-administered medications for this visit.      Past Medical History:  Diagnosis Date  . Adenomatous polyps   . COPD (chronic obstructive pulmonary disease) ([place])   . Coronary artery disease   . Diabetes mellitus without complication ([place])   . Diverticulosis   . Hemorrhoids   . Hyperlipidemia   . Influenza A 10/20/2015  . Obesity     Past Surgical History:  Procedure Laterality Date  . ANTERIOR CRUCIATE LIGAMENT REPAIR     S/P  . CORONARY STENT PLACEMENT  2011   2 stents    Social History   Socioeconomic History  . Marital status: Married    Spouse name: Not on file  . Number of children: 0  . Years of education: 99  . Highest education level: Not on file  Occupational History  . 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 Financial resource strain: Not on file  . Food insecurity:    Worry: Not on file    Inability: Not on file  . Transportation needs:    Medical: Not on file    Non-medical: Not on file  Tobacco Use  . Smoking status: Former Smoker    Packs/day: 1.50    Years: 30.00    Pack years: 45.00    Types: Cigarettes    Last attempt to quit: 10/16/2015    Years since quitting: 2.1  . Smokeless tobacco: Former Network engineer and Sexual Activity  . Alcohol use: Yes    Alcohol/week: 0.6 oz    Types: 1 Standard drinks or equivalent per week  . Drug use: No  . Sexual activity: Yes  Lifestyle  . Physical activity:    Days per week: Not on file    Minutes per  session: Not on file  . Stress: Not on file  Relationships  . Social connections:    Talks on phone: Not on file    Gets together: Not on file    Attends religious service: Not on file    Active member of club or organization: Not on file    Attends meetings of clubs or organizations: Not on file    Relationship status: Not on file  . Intimate partner violence:    Fear of current or ex partner: Not on file    Emotionally abused: Not on file    Physically abused: Not on file    Forced sexual activity: Not on file  Other Topics Concern  . Not on file  Social History Narrative   Lives with wife   Right handed    Caffeine use: Coffee every Sunday   Diet-sun drop daily or coke zero   Has been married twice   Married to current wife for 3 years   No children    Family History  Problem Relation Age of Onset  . Heart attack Mother        Infarction  . Arrhythmia Father        Atrial fibrillation  . Heart attack Maternal Grandfather   . Heart attack Paternal Grandfather     ROS: no fevers or chills, productive cough, hemoptysis, dysphasia, odynophagia, melena, hematochezia, dysuria, hematuria, rash, seizure activity, orthopnea, PND, pedal edema, claudication. Remaining systems are negative.  Physical Exam: Well-developed well-nourished in no acute distress.  Skin is warm and dry.  HEENT is normal.  Neck is supple.  Chest is clear to auscultation with normal expansion.  Cardiovascular exam is regular rate and rhythm.  Abdominal exam nontender or distended. No masses palpated. Extremities show no edema. neuro grossly intact  ECG-normal sinus rhythm at a rate of 61.  No ST changes.  Personally reviewed  A/P  1 coronary artery disease-no recurrent chest pain.  Plan continued medical therapy with aspirin.  Continue Livalo.  2 hyperlipidemia-patient has had difficulties with the majority of statins.  Plan to continue Livalo.  Last LDL 95.  He is only able to tolerate Livalo  every other day.  Zetia caused side effects.  I will refer to lipid clinic for consideration of Repatha.  3 hypertension-patient feels metoprolol may be causing dizziness.  We will discontinue and add amlodipine 5 mg daily.  Follow blood pressure and adjust regimen as needed.  4 obesity-patient needs weight loss and to continue diet and exercise.  Kirk Ruths, MD

## 2017-12-16 ENCOUNTER — Encounter: Payer: Self-pay | Admitting: Cardiology

## 2017-12-16 ENCOUNTER — Ambulatory Visit: Payer: BLUE CROSS/BLUE SHIELD | Admitting: Cardiology

## 2017-12-16 VITALS — BP 129/80 | HR 61 | Ht 75.0 in | Wt 271.8 lb

## 2017-12-16 DIAGNOSIS — E7800 Pure hypercholesterolemia, unspecified: Secondary | ICD-10-CM

## 2017-12-16 DIAGNOSIS — I10 Essential (primary) hypertension: Secondary | ICD-10-CM | POA: Diagnosis not present

## 2017-12-16 DIAGNOSIS — I2510 Atherosclerotic heart disease of native coronary artery without angina pectoris: Secondary | ICD-10-CM | POA: Diagnosis not present

## 2017-12-16 MED ORDER — AMLODIPINE BESYLATE 5 MG PO TABS: 5.0000 mg | ORAL_TABLET | Freq: Every day | ORAL | 3 refills | Status: DC

## 2017-12-16 NOTE — Patient Instructions (Signed)
Medication Instructions:   STOP METOPROLOL  START AMLODIPINE 5 MG ONCE DAILY  Follow-Up:  Your physician wants you to follow-up in: [place] will receive a reminder letter in the mail two months in advance. If you don't receive a letter, please call our office to schedule the follow-up appointment.   REFERRAL TO [facility]  TRACK BLOOD PRESSURE GOAL = LESS THAN 130 ON TOP AND LESS THAN 85 ON THE BOTTOM

## 2017-12-23 DIAGNOSIS — G43109 Migraine with aura, not intractable, without status migrainosus: Secondary | ICD-10-CM | POA: Diagnosis not present

## 2018-01-01 ENCOUNTER — Ambulatory Visit: Payer: BLUE CROSS/BLUE SHIELD

## 2018-02-23 ENCOUNTER — Ambulatory Visit: Payer: BLUE CROSS/BLUE SHIELD | Admitting: Physician Assistant

## 2018-02-24 ENCOUNTER — Telehealth: Payer: Self-pay | Admitting: Physician Assistant

## 2018-02-24 ENCOUNTER — Encounter: Payer: Self-pay | Admitting: Physician Assistant

## 2018-02-24 NOTE — Telephone Encounter (Signed)
Talked to Wendy 

## 2018-03-16 ENCOUNTER — Encounter: Payer: Self-pay | Admitting: Neurology

## 2018-03-16 ENCOUNTER — Ambulatory Visit: Payer: BLUE CROSS/BLUE SHIELD | Admitting: Neurology

## 2018-03-16 VITALS — BP 134/81 | HR 70 | Ht 75.0 in | Wt 278.0 lb

## 2018-03-16 DIAGNOSIS — R42 Dizziness and giddiness: Secondary | ICD-10-CM

## 2018-03-16 MED ORDER — VENLAFAXINE HCL ER 37.5 MG PO CP24
ORAL_CAPSULE | ORAL | 2 refills | Status: DC
Start: 2018-03-16 — End: 2018-04-22

## 2018-03-16 NOTE — Progress Notes (Signed)
Reason for visit: Chronic dizziness  Algis Lehenbauer Loewe is an 52 y.o. male  History of present illness:  Mr. William Carson is a 52 year old right-handed white male with a history of obesity, COPD, diabetes, and coronary artery disease.  The patient developed sudden onset of headache and dizziness about 1 year ago, the headaches have improved, but he still is having about 2 headaches a week.  Headaches are not severe, he is able to take Aleve and improve the headache.  The patient has persistent dizzy sensations sometimes associated with a rocking sensation.  The dizziness is present when sitting, lying down, and standing.  He has refrained from climbing to heights because of this.  He is able to operate a motor vehicle without difficulty.  He has been given a trial on Topamax, Zonegran, and antihistamines without benefit.  Diazepam seem to help some.  He does not take diazepam currently.  MRI of the brain, MRA of the head, and carotid Doppler studies were unremarkable.  The patient does not report any medication changes that occurred around the time of onset of symptoms.  He tried coming off most of his medications to see if this would help him, but it did not.  Vestibular rehabilitation was not beneficial.  The patient was seen through ENT, he was given an antihistamine which did not help.  He was told that he had dizziness related to migraine headache.  He comes to this office for an evaluation.  Past Medical History:  Diagnosis Date  . Adenomatous polyps   . COPD (chronic obstructive pulmonary disease) ([place])   . Coronary artery disease   . Diabetes mellitus without complication ([place])   . Diverticulosis   . Hemorrhoids   . Hyperlipidemia   . Influenza A 10/20/2015  . Obesity     Past Surgical History:  Procedure Laterality Date  . ANTERIOR CRUCIATE LIGAMENT REPAIR     S/P  . CORONARY STENT PLACEMENT  2011   2 stents    Family History  Problem Relation Age of Onset  . Heart attack Mother          Infarction  . Arrhythmia Father        Atrial fibrillation  . Heart attack Maternal Grandfather   . Heart attack Paternal Grandfather     Social history:  reports that he quit smoking about 2 years ago. His smoking use included cigarettes. He has a 45.00 pack-year smoking history. He has quit using smokeless tobacco. He reports that he drinks about 1.0 standard drinks of alcohol per week. He reports that he does not use drugs.    Allergies  Allergen Reactions  . Crestor [Rosuvastatin Calcium] Other (See Comments)    myalgias  . Depakote [Divalproex Sodium]     Stomach pains  . Lipitor [Atorvastatin] Other (See Comments)    Myalgias   . Meclizine     Increased dizziness  . Other Swelling    Mammelain meat allergy  . Simvastatin Other (See Comments)    Myalgias   . Topamax [Topiramate]     Back pain, tinnitus  . Zonegran [Zonisamide]     Increased headache    Medications:  Prior to Admission medications   Medication Sig Start Date End Date Taking? Authorizing Provider  albuterol (PROVENTIL HFA;VENTOLIN HFA) 108 (90 Base) MCG/ACT inhaler Inhale 1-2 puffs into the lungs every 6 (six) hours as needed for wheezing or shortness of breath. 08/15/17  Yes Timmothy Euler, MD  albuterol (PROVENTIL) (2.5  MG/3ML) 0.083% nebulizer solution Take 3 mLs (2.5 mg total) by nebulization every 4 (four) hours as needed for wheezing or shortness of breath. 10/31/15  Yes Timmothy Euler, MD  amLODipine (NORVASC) 5 MG tablet Take 1 tablet (5 mg total) by mouth daily. 12/16/17 03/16/18 Yes Lelon Perla, MD  aspirin 81 MG EC tablet Take 81 mg by mouth daily.     Yes [provider]  diazepam (VALIUM) 10 MG tablet Take 0.5-1 tablets (5-10 mg total) every 8 (eight) hours as needed by mouth (dizziness). 05/29/17  Yes Timmothy Euler, MD  glucose blood (ONETOUCH VERIO) test strip Use to check BG up to once daily 05/14/16  Yes Cherre Robins, PharmD  LIVALO 4 MG TABS TAKE 1 TABLET  DAILY 12/11/17  Yes Terald Sleeper, PA-C  nitroGLYCERIN (NITROSTAT) 0.4 MG SL tablet Place 1 tablet (0.4 mg total) under the tongue every 5 (five) minutes as needed for chest pain. May repeat up to 3 doses. 12/01/15  Yes Timmothy Euler, MD  [facility] DELICA LANCETS 85I MISC Use to check BG up to once daily 05/14/16  Yes Eckard, Tammy, PharmD  oxyCODONE-acetaminophen (ROXICET) 5-325 MG tablet Take 1 tablet by mouth every 8 (eight) hours as needed for severe pain. 11/21/17  Yes Timmothy Euler, MD  SitaGLIPtin-MetFORMIN HCl (JANUMET XR) [phone] MG TB24 Take 1 tablet by mouth daily. 11/21/17  Yes Timmothy Euler, MD  SYMBICORT 160-4.5 MCG/ACT inhaler Inhale 2 puffs into the lungs 2 (two) times daily. 08/15/17  Yes Timmothy Euler, MD  venlafaxine XR (EFFEXOR XR) 37.5 MG 24 hr capsule One tablet daily for 2 weeks, then take 2 tablets daily 03/16/18   Kathrynn Ducking, MD    ROS:  Out of a complete 14 system review of symptoms, the patient complains only of the following symptoms, and all other reviewed systems are negative.  Joint pain, back pain Food allergies Dizziness, headache Ringing in the ears  Blood pressure 134/81, pulse 70, height 6\' 3"  (1.905 m), weight 278 lb (126.1 kg).  Physical Exam  General: The patient is alert and cooperative at the time of the examination.  The patient is markedly obese.  Skin: No significant peripheral edema is noted.   Neurologic Exam  Mental status: The patient is alert and oriented x 3 at the time of the examination. The patient has apparent normal recent and remote memory, with an apparently normal attention span and concentration ability.   Cranial nerves: Facial symmetry is present. Speech is normal, no aphasia or dysarthria is noted. Extraocular movements are full. Visual fields are full.  Motor: The patient has good strength in all 4 extremities.  Sensory examination: Soft touch sensation is symmetric on the face, arms, and  legs.  Coordination: The patient has good finger-nose-finger and heel-to-shin bilaterally.  Gait and station: The patient has a normal gait. Tandem gait is normal. Romberg is negative. No drift is seen.  Reflexes: Deep tendon reflexes are symmetric.   Assessment/Plan:  1.  Chronic dizziness  2.  Episodic headache  It is not clear whether or not the dizziness and the headaches are related to migraine.  The patient has not really had a history of headache throughout his life until 1 year ago when the headaches and the dizziness came on together, the headaches initially were quite severe.  The patient will be given a trial on Effexor taking 37.5 mg daily for 2 weeks and go to 75 mg daily, he will  contact our office if he is tolerating the drug and can tolerate a higher dose.  He will follow-up in 4 months.  Jill Alexanders MD 03/16/2018 8:28 AM  [facility] [address] [place], [place] [address]  Phone [phone] Fax [phone]

## 2018-03-16 NOTE — Patient Instructions (Signed)
We will start effexor for the headache.  Call for any dose adjustments.

## 2018-03-30 ENCOUNTER — Ambulatory Visit: Payer: BLUE CROSS/BLUE SHIELD | Admitting: Physician Assistant

## 2018-03-30 ENCOUNTER — Encounter: Payer: Self-pay | Admitting: Physician Assistant

## 2018-03-30 VITALS — BP 115/72 | HR 83 | Temp 98.6°F | Ht 75.0 in | Wt 280.2 lb

## 2018-03-30 DIAGNOSIS — J439 Emphysema, unspecified: Secondary | ICD-10-CM

## 2018-03-30 DIAGNOSIS — M5442 Lumbago with sciatica, left side: Secondary | ICD-10-CM

## 2018-03-30 DIAGNOSIS — R42 Dizziness and giddiness: Secondary | ICD-10-CM | POA: Diagnosis not present

## 2018-03-30 DIAGNOSIS — G4489 Other headache syndrome: Secondary | ICD-10-CM

## 2018-03-30 DIAGNOSIS — E119 Type 2 diabetes mellitus without complications: Secondary | ICD-10-CM | POA: Diagnosis not present

## 2018-03-30 DIAGNOSIS — G8929 Other chronic pain: Secondary | ICD-10-CM

## 2018-03-30 DIAGNOSIS — Z0000 Encounter for general adult medical examination without abnormal findings: Secondary | ICD-10-CM

## 2018-03-30 LAB — BAYER DCA HB A1C WAIVED: HB A1C (BAYER DCA - WAIVED): 5.7 % (ref <7.0)

## 2018-03-30 MED ORDER — DIAZEPAM 10 MG PO TABS: 5.0000 mg | ORAL_TABLET | Freq: Three times a day (TID) | ORAL | 1 refills | Status: DC | PRN

## 2018-03-30 MED ORDER — OXYCODONE-ACETAMINOPHEN 5-325 MG PO TABS: 1.0000 | ORAL_TABLET | Freq: Three times a day (TID) | ORAL | 0 refills | Status: DC | PRN

## 2018-03-30 MED ORDER — SITAGLIP PHOS-METFORMIN HCL ER 100-1000 MG PO TB24: 1.0000 | ORAL_TABLET | Freq: Every day | ORAL | 3 refills | Status: DC

## 2018-03-30 MED ORDER — BUDESONIDE-FORMOTEROL FUMARATE 160-4.5 MCG/ACT IN AERO: INHALATION_SPRAY | RESPIRATORY_TRACT | 11 refills | Status: DC

## 2018-03-30 NOTE — Progress Notes (Signed)
BP 115/72   Pulse 83   Temp 98.6 F (37 C) (Oral)   Wt 280 lb 3.2 oz (127.1 kg)   BMI 35.02 kg/m    Subjective:    Patient ID: William Carson, male    DOB: 15-Jul-1966, 52 y.o.   MRN: [id number]  HPI: William Carson is a 52 y.o. male presenting on 03/30/2018 for Hyperlipidemia (4 month follow up ) and Diabetes  This patient comes in for periodic recheck on medications and conditions including type 2 diabetes uncontrolled, vertigo or dizziness, headache, emphysema, chronic low back pain.  He has been taking his medication very well.  He is not having any difficulties at this time.  We will update labs today while he is in the office.  He does not have any other new complaints..   All medications are reviewed today. There are no reports of any problems with the medications. All of the medical conditions are reviewed and updated.  Lab work is reviewed and will be ordered as medically necessary. There are no new problems reported with today's visit.   Past Medical History:  Diagnosis Date  . Adenomatous polyps   . COPD (chronic obstructive pulmonary disease) ([place])   . Coronary artery disease   . Diabetes mellitus without complication ([place])   . Diverticulosis   . Hemorrhoids   . Hyperlipidemia   . Influenza A 10/20/2015  . Obesity    Relevant past medical, surgical, family and social history reviewed and updated as indicated. Interim medical history since our last visit reviewed. Allergies and medications reviewed and updated. DATA REVIEWED: CHART IN EPIC  Family History reviewed for pertinent findings.  Review of Systems  Constitutional: Negative.  Negative for appetite change and fatigue.  HENT: Negative.   Eyes: Negative.  Negative for pain and visual disturbance.  Respiratory: Negative.  Negative for cough, chest tightness, shortness of breath and wheezing.   Cardiovascular: Negative.  Negative for chest pain, palpitations and leg swelling.  Gastrointestinal: Negative.   Negative for abdominal pain, diarrhea, nausea and vomiting.  Endocrine: Negative.   Genitourinary: Negative.   Musculoskeletal: Positive for arthralgias and back pain.  Skin: Negative.  Negative for color change and rash.  Neurological: Positive for dizziness and headaches. Negative for weakness and numbness.  Psychiatric/Behavioral: Negative.     Allergies as of 03/30/2018      Reactions   Crestor [rosuvastatin Calcium] Other (See Comments)   myalgias   Depakote [divalproex Sodium]    Stomach pains   Lipitor [atorvastatin] Other (See Comments)   Myalgias   Meclizine    Increased dizziness   Other Swelling   Mammelain meat allergy   Simvastatin Other (See Comments)   Myalgias   Topamax [topiramate]    Back pain, tinnitus   Zonegran [zonisamide]    Increased headache      Medication List        Accurate as of 03/30/18 10:12 PM. Always use your most recent med list.          albuterol (2.5 MG/3ML) 0.083% nebulizer solution Commonly known as:  PROVENTIL Take 3 mLs (2.5 mg total) by nebulization every 4 (four) hours as needed for wheezing or shortness of breath.   albuterol 108 (90 Base) MCG/ACT inhaler Commonly known as:  PROVENTIL HFA;VENTOLIN HFA Inhale 1-2 puffs into the lungs every 6 (six) hours as needed for wheezing or shortness of breath.   amLODipine 5 MG tablet Commonly known as:  NORVASC Take 1 tablet (5  mg total) by mouth daily.   aspirin 81 MG EC tablet Take 81 mg by mouth daily.   budesonide-formoterol 160-4.5 MCG/ACT inhaler Commonly known as:  SYMBICORT Inhale 2 puffs into the lungs 2 (two) times daily.   diazepam 10 MG tablet Commonly known as:  VALIUM Take 0.5-1 tablets (5-10 mg total) by mouth every 8 (eight) hours as needed (dizziness).   glucose blood test strip Use to check BG up to once daily   LIVALO 4 MG Tabs Generic drug:  Pitavastatin Calcium TAKE 1 TABLET DAILY   nitroGLYCERIN 0.4 MG SL tablet Commonly known as:  NITROSTAT Place  1 tablet (0.4 mg total) under the tongue every 5 (five) minutes as needed for chest pain. May repeat up to 3 doses.   ONETOUCH DELICA LANCETS 33G Misc Use to check BG up to once daily   oxyCODONE-acetaminophen 5-325 MG tablet Commonly known as:  PERCOCET/ROXICET Take 1 tablet by mouth every 8 (eight) hours as needed for severe pain.   SitaGLIPtin-MetFORMIN HCl 100-1000 MG Tb24 Take 1 tablet by mouth daily.   venlafaxine XR 37.5 MG 24 hr capsule Commonly known as:  EFFEXOR-XR One tablet daily for 2 weeks, then take 2 tablets daily          Objective:    BP 115/72   Pulse 83   Temp 98.6 F (37 C) (Oral)   Wt 280 lb 3.2 oz (127.1 kg)   BMI 35.02 kg/m   Allergies  Allergen Reactions  . Crestor [Rosuvastatin Calcium] Other (See Comments)    myalgias  . Depakote [Divalproex Sodium]     Stomach pains  . Lipitor [Atorvastatin] Other (See Comments)    Myalgias   . Meclizine     Increased dizziness  . Other Swelling    Mammelain meat allergy  . Simvastatin Other (See Comments)    Myalgias   . Topamax [Topiramate]     Back pain, tinnitus  . Zonegran [Zonisamide]     Increased headache    Wt Readings from Last 3 Encounters:  03/30/18 280 lb 3.2 oz (127.1 kg)  03/16/18 278 lb (126.1 kg)  12/16/17 271 lb 12.8 oz (123.3 kg)    Physical Exam  Constitutional: He appears well-developed and well-nourished.  HENT:  Head: Normocephalic and atraumatic.  Eyes: Pupils are equal, round, and reactive to light. Conjunctivae and EOM are normal.  Neck: Normal range of motion. Neck supple.  Cardiovascular: Normal rate, regular rhythm and normal heart sounds.  Pulmonary/Chest: Effort normal and breath sounds normal.  Abdominal: Soft. Bowel sounds are normal.  Musculoskeletal: Normal range of motion.  Skin: Skin is warm and dry.    Results for orders placed or performed in visit on 03/30/18  Bayer DCA Hb A1c Waived  Result Value Ref Range   HB A1C (BAYER DCA - WAIVED) 5.7 <7.0  %      Assessment & Plan:   1. Chronic left-sided low back pain with left-sided sciatica - oxyCODONE-acetaminophen (ROXICET) 5-325 MG tablet; Take 1 tablet by mouth every 8 (eight) hours as needed for severe pain.  Dispense: 30 tablet; Refill: 0  2. Type 2 diabetes mellitus without complication, without long-term current use of insulin (HCC) - CBC with Differential/Platelet - CMP14+EGFR - Lipid panel - PSA - Bayer DCA Hb A1c Waived - SitaGLIPtin-MetFORMIN HCl (JANUMET XR) 100-1000 MG TB24; Take 1 tablet by mouth daily.  Dispense: 90 tablet; Refill: 3  3. Dizziness - diazepam (VALIUM) 10 MG tablet; Take 0.5-1 tablets (5-10 mg   total) by mouth every 8 (eight) hours as needed (dizziness).  Dispense: 40 tablet; Refill: 1  4. Headache syndrome - oxyCODONE-acetaminophen (ROXICET) 5-325 MG tablet; Take 1 tablet by mouth every 8 (eight) hours as needed for severe pain.  Dispense: 30 tablet; Refill: 0  5. Pulmonary emphysema, unspecified emphysema type (HCC) - budesonide-formoterol (SYMBICORT) 160-4.5 MCG/ACT inhaler; Inhale 2 puffs into the lungs 2 (two) times daily.  Dispense: 10.2 g; Refill: 11  6. Well adult exam - CBC with Differential/Platelet - CMP14+EGFR - Lipid panel - PSA - Bayer DCA Hb A1c Waived   Continue all other maintenance medications as listed above.  Follow up plan: No follow-ups on file.  Educational handout given for survey   S.  PA-C [facility] [address]  [place], [place] [address] [phone]   03/30/2018, 10:12 PM  

## 2018-03-31 LAB — LIPID PANEL
CHOLESTEROL TOTAL: 128 mg/dL (ref 100–199)
Chol/HDL Ratio: 3.8 ratio (ref 0.0–5.0)
HDL: 34 mg/dL — AB (ref >39)
LDL Calculated: 76 mg/dL (ref 0–99)
TRIGLYCERIDES: 91 mg/dL (ref 0–149)
VLDL CHOLESTEROL CAL: 18 mg/dL (ref 5–40)

## 2018-03-31 LAB — CBC WITH DIFFERENTIAL/PLATELET
BASOS ABS: 0.1 10*3/uL (ref 0.0–0.2)
BASOS: 1 %
EOS (ABSOLUTE): 0.2 10*3/uL (ref 0.0–0.4)
Eos: 2 %
Hematocrit: 45.4 % (ref 37.5–51.0)
Hemoglobin: 15.3 g/dL (ref 13.0–17.7)
IMMATURE GRANS (ABS): 0.1 10*3/uL (ref 0.0–0.1)
IMMATURE GRANULOCYTES: 1 %
LYMPHS: 22 %
Lymphocytes Absolute: 2.2 10*3/uL (ref 0.7–3.1)
MCH: 30.6 pg (ref 26.6–33.0)
MCHC: 33.7 g/dL (ref 31.5–35.7)
MCV: 91 fL (ref 79–97)
Monocytes Absolute: 1.1 10*3/uL — ABNORMAL HIGH (ref 0.1–0.9)
Monocytes: 11 %
NEUTROS ABS: 6.2 10*3/uL (ref 1.4–7.0)
NEUTROS PCT: 63 %
PLATELETS: 292 10*3/uL (ref 150–450)
RBC: 5 x10E6/uL (ref 4.14–5.80)
RDW: 13.3 % (ref 12.3–15.4)
WBC: 9.8 10*3/uL (ref 3.4–10.8)

## 2018-03-31 LAB — CMP14+EGFR
A/G RATIO: 1.7 (ref 1.2–2.2)
ALT: 56 IU/L — AB (ref 0–44)
AST: 30 IU/L (ref 0–40)
Albumin: 4.3 g/dL (ref 3.5–5.5)
Alkaline Phosphatase: 41 IU/L (ref 39–117)
BILIRUBIN TOTAL: 0.3 mg/dL (ref 0.0–1.2)
BUN/Creatinine Ratio: 14 (ref 9–20)
BUN: 11 mg/dL (ref 6–24)
CHLORIDE: 105 mmol/L (ref 96–106)
CO2: 22 mmol/L (ref 20–29)
Calcium: 9.7 mg/dL (ref 8.7–10.2)
Creatinine, Ser: 0.78 mg/dL (ref 0.76–1.27)
GFR calc Af Amer: 119 mL/min/{1.73_m2} (ref >59)
GFR calc non Af Amer: 103 mL/min/{1.73_m2} (ref >59)
Globulin, Total: 2.6 g/dL (ref 1.5–4.5)
Glucose: 101 mg/dL — ABNORMAL HIGH (ref 65–99)
POTASSIUM: 4 mmol/L (ref 3.5–5.2)
Sodium: 142 mmol/L (ref 134–144)
Total Protein: 6.9 g/dL (ref 6.0–8.5)

## 2018-03-31 LAB — PSA: PROSTATE SPECIFIC AG, SERUM: 1.6 ng/mL (ref 0.0–4.0)

## 2018-04-22 ENCOUNTER — Telehealth: Payer: Self-pay | Admitting: Neurology

## 2018-04-22 MED ORDER — VENLAFAXINE HCL ER 150 MG PO CP24: 150.0000 mg | ORAL_CAPSULE | Freq: Every day | ORAL | 3 refills | Status: DC

## 2018-04-22 NOTE — Telephone Encounter (Signed)
Pt called venlafaxine XR (EFFEXOR XR) 37.5 MG 24 hr capsule has helped with dizziness but the HA's are more frequent. He is taking 2 tabs of 37.5mg  daily. He has 1 tab left, he is wanting to know if he should get it refilled. Please call to advise

## 2018-04-22 NOTE — Telephone Encounter (Signed)
I called the patient.  The patient had improvement in his dizziness on 75 mg of Effexor, his headaches are gone from 2 a week to 3 a week, and may be more severe.  We will try pushing the dose of Effexor up to 150 mg daily, if he does not do well with this he will call me.  I will send in a prescription for the 150 mg tablet.

## 2018-06-23 ENCOUNTER — Other Ambulatory Visit: Payer: Self-pay

## 2018-06-23 ENCOUNTER — Inpatient Hospital Stay (HOSPITAL_COMMUNITY)
Admission: EM | Admit: 2018-06-23 | Discharge: 2018-06-27 | DRG: 699 | Disposition: A | Discharge status: Home / Self Care | Payer: BLUE CROSS/BLUE SHIELD | Attending: Internal Medicine | Admitting: Internal Medicine

## 2018-06-23 ENCOUNTER — Encounter (HOSPITAL_COMMUNITY): Payer: Self-pay | Admitting: Emergency Medicine

## 2018-06-23 ENCOUNTER — Emergency Department (HOSPITAL_COMMUNITY): Payer: BLUE CROSS/BLUE SHIELD

## 2018-06-23 DIAGNOSIS — S37019A Minor contusion of unspecified kidney, initial encounter: Secondary | ICD-10-CM | POA: Diagnosis present

## 2018-06-23 DIAGNOSIS — Z794 Long term (current) use of insulin: Secondary | ICD-10-CM | POA: Diagnosis not present

## 2018-06-23 DIAGNOSIS — Z79891 Long term (current) use of opiate analgesic: Secondary | ICD-10-CM

## 2018-06-23 DIAGNOSIS — D62 Acute posthemorrhagic anemia: Secondary | ICD-10-CM | POA: Diagnosis not present

## 2018-06-23 DIAGNOSIS — Z23 Encounter for immunization: Secondary | ICD-10-CM | POA: Diagnosis not present

## 2018-06-23 DIAGNOSIS — E119 Type 2 diabetes mellitus without complications: Secondary | ICD-10-CM | POA: Diagnosis not present

## 2018-06-23 DIAGNOSIS — Z87442 Personal history of urinary calculi: Secondary | ICD-10-CM

## 2018-06-23 DIAGNOSIS — E669 Obesity, unspecified: Secondary | ICD-10-CM | POA: Diagnosis present

## 2018-06-23 DIAGNOSIS — Z87891 Personal history of nicotine dependence: Secondary | ICD-10-CM

## 2018-06-23 DIAGNOSIS — E785 Hyperlipidemia, unspecified: Secondary | ICD-10-CM | POA: Diagnosis not present

## 2018-06-23 DIAGNOSIS — I2510 Atherosclerotic heart disease of native coronary artery without angina pectoris: Secondary | ICD-10-CM

## 2018-06-23 DIAGNOSIS — S37011A Minor contusion of right kidney, initial encounter: Secondary | ICD-10-CM | POA: Diagnosis not present

## 2018-06-23 DIAGNOSIS — Z79899 Other long term (current) drug therapy: Secondary | ICD-10-CM | POA: Diagnosis not present

## 2018-06-23 DIAGNOSIS — Z888 Allergy status to other drugs, medicaments and biological substances status: Secondary | ICD-10-CM | POA: Diagnosis not present

## 2018-06-23 DIAGNOSIS — J449 Chronic obstructive pulmonary disease, unspecified: Secondary | ICD-10-CM | POA: Diagnosis not present

## 2018-06-23 DIAGNOSIS — Z7982 Long term (current) use of aspirin: Secondary | ICD-10-CM

## 2018-06-23 DIAGNOSIS — Z955 Presence of coronary angioplasty implant and graft: Secondary | ICD-10-CM | POA: Diagnosis not present

## 2018-06-23 DIAGNOSIS — M7981 Nontraumatic hematoma of soft tissue: Secondary | ICD-10-CM | POA: Diagnosis not present

## 2018-06-23 DIAGNOSIS — Z8249 Family history of ischemic heart disease and other diseases of the circulatory system: Secondary | ICD-10-CM | POA: Diagnosis not present

## 2018-06-23 DIAGNOSIS — N179 Acute kidney failure, unspecified: Secondary | ICD-10-CM | POA: Diagnosis not present

## 2018-06-23 DIAGNOSIS — Z9861 Coronary angioplasty status: Secondary | ICD-10-CM

## 2018-06-23 DIAGNOSIS — E7800 Pure hypercholesterolemia, unspecified: Secondary | ICD-10-CM | POA: Diagnosis not present

## 2018-06-23 DIAGNOSIS — K760 Fatty (change of) liver, not elsewhere classified: Secondary | ICD-10-CM | POA: Diagnosis not present

## 2018-06-23 DIAGNOSIS — R102 Pelvic and perineal pain: Secondary | ICD-10-CM | POA: Diagnosis present

## 2018-06-23 DIAGNOSIS — J438 Other emphysema: Secondary | ICD-10-CM | POA: Diagnosis not present

## 2018-06-23 DIAGNOSIS — E1169 Type 2 diabetes mellitus with other specified complication: Secondary | ICD-10-CM | POA: Diagnosis present

## 2018-06-23 DIAGNOSIS — Z6835 Body mass index (BMI) 35.0-35.9, adult: Secondary | ICD-10-CM | POA: Diagnosis not present

## 2018-06-23 DIAGNOSIS — R1031 Right lower quadrant pain: Secondary | ICD-10-CM | POA: Diagnosis not present

## 2018-06-23 LAB — CBC
HCT: 42.3 % (ref 39.0–52.0)
Hemoglobin: 13.3 g/dL (ref 13.0–17.0)
MCH: 29.8 pg (ref 26.0–34.0)
MCHC: 31.4 g/dL (ref 30.0–36.0)
MCV: 94.6 fL (ref 80.0–100.0)
Platelets: 322 10*3/uL (ref 150–400)
RBC: 4.47 MIL/uL (ref 4.22–5.81)
RDW: 14.6 % (ref 11.5–15.5)
WBC: 18.6 10*3/uL — ABNORMAL HIGH (ref 4.0–10.5)
nRBC: 0 % (ref 0.0–0.2)

## 2018-06-23 LAB — URINALYSIS, ROUTINE W REFLEX MICROSCOPIC
Bacteria, UA: NONE SEEN
Bilirubin Urine: NEGATIVE
Glucose, UA: NEGATIVE mg/dL
Ketones, ur: NEGATIVE mg/dL
Leukocytes, UA: NEGATIVE
Nitrite: NEGATIVE
Protein, ur: 30 mg/dL — AB
Specific Gravity, Urine: >1.046 — ABNORMAL HIGH (ref 1.005–1.030)
pH: 5 (ref 5.0–8.0)

## 2018-06-23 LAB — COMPREHENSIVE METABOLIC PANEL
ALBUMIN: 4.1 g/dL (ref 3.5–5.0)
ALK PHOS: 39 U/L (ref 38–126)
ALT: 67 U/L — ABNORMAL HIGH (ref 0–44)
AST: 35 U/L (ref 15–41)
Anion gap: 8 (ref 5–15)
BILIRUBIN TOTAL: 0.5 mg/dL (ref 0.3–1.2)
BUN: 13 mg/dL (ref 6–20)
CO2: 19 mmol/L — ABNORMAL LOW (ref 22–32)
Calcium: 8.6 mg/dL — ABNORMAL LOW (ref 8.9–10.3)
Chloride: 107 mmol/L (ref 98–111)
Creatinine, Ser: 0.94 mg/dL (ref 0.61–1.24)
GFR calc Af Amer: >60 mL/min (ref >60)
GFR calc non Af Amer: >60 mL/min (ref >60)
Glucose, Bld: 200 mg/dL — ABNORMAL HIGH (ref 70–99)
Potassium: 3.9 mmol/L (ref 3.5–5.1)
Sodium: 134 mmol/L — ABNORMAL LOW (ref 135–145)
Total Protein: 6.9 g/dL (ref 6.5–8.1)

## 2018-06-23 LAB — PHOSPHORUS: Phosphorus: 3.8 mg/dL (ref 2.5–4.6)

## 2018-06-23 LAB — LIPASE, BLOOD: Lipase: 29 U/L (ref 11–51)

## 2018-06-23 LAB — MAGNESIUM: Magnesium: 1.8 mg/dL (ref 1.7–2.4)

## 2018-06-23 MED ORDER — ONDANSETRON HCL 4 MG/2ML IJ SOLN
4.0000 mg | Freq: Once | INTRAMUSCULAR | Status: AC
  Administered 2018-06-23: 4 mg via INTRAVENOUS
  Filled 2018-06-23: qty 2

## 2018-06-23 MED ORDER — SODIUM CHLORIDE 0.9 % IV SOLN: INTRAVENOUS | Status: DC

## 2018-06-23 MED ORDER — ACETAMINOPHEN 650 MG RE SUPP: 650.0000 mg | Freq: Four times a day (QID) | RECTAL | Status: DC | PRN

## 2018-06-23 MED ORDER — HYDROMORPHONE HCL 1 MG/ML IJ SOLN
1.0000 mg | Freq: Once | INTRAMUSCULAR | Status: AC
  Administered 2018-06-23: 1 mg via INTRAVENOUS
  Filled 2018-06-23: qty 1

## 2018-06-23 MED ORDER — SODIUM CHLORIDE 0.9 % IV SOLN
INTRAVENOUS | Status: DC
  Administered 2018-06-23 – 2018-06-26 (×5): via INTRAVENOUS

## 2018-06-23 MED ORDER — ONDANSETRON HCL 4 MG PO TABS: 4.0000 mg | ORAL_TABLET | Freq: Four times a day (QID) | ORAL | Status: DC | PRN

## 2018-06-23 MED ORDER — HYDROMORPHONE HCL 1 MG/ML IJ SOLN
1.0000 mg | INTRAMUSCULAR | Status: DC | PRN
  Administered 2018-06-24 – 2018-06-26 (×20): 1 mg via INTRAVENOUS
  Filled 2018-06-23 (×21): qty 1

## 2018-06-23 MED ORDER — CIPROFLOXACIN IN D5W 400 MG/200ML IV SOLN
400.0000 mg | Freq: Once | INTRAVENOUS | Status: AC
  Administered 2018-06-23: 400 mg via INTRAVENOUS
  Filled 2018-06-23: qty 200

## 2018-06-23 MED ORDER — ONDANSETRON HCL 4 MG/2ML IJ SOLN: 4.0000 mg | Freq: Four times a day (QID) | INTRAMUSCULAR | Status: DC | PRN

## 2018-06-23 MED ORDER — ACETAMINOPHEN 325 MG PO TABS: 650.0000 mg | ORAL_TABLET | Freq: Four times a day (QID) | ORAL | Status: DC | PRN

## 2018-06-23 MED ORDER — SODIUM CHLORIDE 0.9 % IV BOLUS
1000.0000 mL | Freq: Once | INTRAVENOUS | Status: AC
  Administered 2018-06-23: 1000 mL via INTRAVENOUS

## 2018-06-23 MED ORDER — IOPAMIDOL (ISOVUE-300) INJECTION 61%
100.0000 mL | Freq: Once | INTRAVENOUS | Status: AC | PRN
  Administered 2018-06-23: 100 mL via INTRAVENOUS

## 2018-06-23 MED ORDER — MORPHINE SULFATE (PF) 4 MG/ML IV SOLN
4.0000 mg | Freq: Once | INTRAVENOUS | Status: AC
  Administered 2018-06-23: 4 mg via INTRAVENOUS
  Filled 2018-06-23: qty 1

## 2018-06-23 NOTE — ED Provider Notes (Addendum)
[facility] EMERGENCY DEPARTMENT Provider Note   CSN: [id number] Arrival date & time: 06/23/18  1800     History   Chief Complaint Chief Complaint  Patient presents with  . Abdominal Pain    HPI William Carson is a 52 y.o. male.  HPI Patient presents with concern of abdominal pain, nausea, anorexia. Patient is a poor historian, but is committed by his wife who adds much of the HPI. Seems that the patient initially had left lower quadrant abdominal pain, about 3 days ago. This was mild. Over the interval 3 days the patient has developed pain focally in the right lower quadrant, sharp, severe, worse with motion, mild palpation. There is worsening nausea and anorexia, now over the past 6 hours, but no vomiting. He is unsure of a fever, states that he feels generally poorly. He has a history of diverticulitis, with recent colonoscopy and endoscopy. No current antibiotic therapy. Today, no relief in spite of using Percocet.  Past Medical History:  Diagnosis Date  . Adenomatous polyps   . COPD (chronic obstructive pulmonary disease) ([place])   . Coronary artery disease   . Diabetes mellitus without complication ([place])   . Diverticulosis   . Hemorrhoids   . Hyperlipidemia   . Influenza A 10/20/2015  . Obesity     Patient Active Problem List   Diagnosis Date Noted  . Dizziness 05/08/2017  . Headache syndrome 05/08/2017  . History of diverticulitis 12/24/2016  . History of kidney stones 12/24/2016  . T2DM (type 2 diabetes mellitus) ([place]) 05/13/2016  . Allergy to meat 05/07/2016  . Chronic left-sided low back pain with left-sided sciatica 05/07/2016  . Respiratory failure with hypoxia and hypercapnia ([place]) 10/20/2015  . Obesity (BMI 30-39.9) 08/04/2015  . Low serum vitamin D 04/12/2015  . Tobacco abuse 01/18/2011  . Hyperlipidemia 07/18/2010  . Obesity 07/18/2010  . CAD S/P percutaneous coronary angioplasty 07/18/2010  . COPD (chronic obstructive pulmonary disease) ([place])  07/18/2010    Past Surgical History:  Procedure Laterality Date  . ANTERIOR CRUCIATE LIGAMENT REPAIR     S/P  . CORONARY STENT PLACEMENT  2011   2 stents        Home Medications    Prior to Admission medications   Medication Sig Start Date End Date Taking? Authorizing Provider  albuterol (PROVENTIL HFA;VENTOLIN HFA) 108 (90 Base) MCG/ACT inhaler Inhale 1-2 puffs into the lungs every 6 (six) hours as needed for wheezing or shortness of breath. 08/15/17   Timmothy Euler, MD  albuterol (PROVENTIL) (2.5 MG/3ML) 0.083% nebulizer solution Take 3 mLs (2.5 mg total) by nebulization every 4 (four) hours as needed for wheezing or shortness of breath. 10/31/15   Timmothy Euler, MD  amLODipine (NORVASC) 5 MG tablet Take 1 tablet (5 mg total) by mouth daily. 12/16/17 03/30/18  Lelon Perla, MD  aspirin 81 MG EC tablet Take 81 mg by mouth daily.      [provider]  budesonide-formoterol (SYMBICORT) 160-4.5 MCG/ACT inhaler Inhale 2 puffs into the lungs 2 (two) times daily. 03/30/18   Terald Sleeper, PA-C  diazepam (VALIUM) 10 MG tablet Take 0.5-1 tablets (5-10 mg total) by mouth every 8 (eight) hours as needed (dizziness). 03/30/18   Terald Sleeper, PA-C  glucose blood (ONETOUCH VERIO) test strip Use to check BG up to once daily 05/14/16   Cherre Robins, PharmD  LIVALO 4 MG TABS TAKE 1 TABLET DAILY 12/11/17   Terald Sleeper, PA-C  nitroGLYCERIN (NITROSTAT) 0.4  MG SL tablet Place 1 tablet (0.4 mg total) under the tongue every 5 (five) minutes as needed for chest pain. May repeat up to 3 doses. 12/01/15   Timmothy Euler, MD  [facility] DELICA LANCETS 32G MISC Use to check BG up to once daily 05/14/16   Cherre Robins, PharmD  oxyCODONE-acetaminophen (ROXICET) 5-325 MG tablet Take 1 tablet by mouth every 8 (eight) hours as needed for severe pain. 03/30/18   Terald Sleeper, PA-C  SitaGLIPtin-MetFORMIN HCl (JANUMET XR) [phone] MG TB24 Take 1 tablet by mouth daily. 03/30/18   Terald Sleeper, PA-C    venlafaxine XR (EFFEXOR XR) 150 MG 24 hr capsule Take 1 capsule (150 mg total) by mouth daily with breakfast. 04/22/18   Kathrynn Ducking, MD    Family History Family History  Problem Relation Age of Onset  . Heart attack Mother        Infarction  . Arrhythmia Father        Atrial fibrillation  . Heart attack Maternal Grandfather   . Heart attack Paternal Grandfather     Social History Social History   Tobacco Use  . Smoking status: Former Smoker    Packs/day: 1.50    Years: 30.00    Pack years: 45.00    Types: Cigarettes    Last attempt to quit: 10/16/2015    Years since quitting: 2.6  . Smokeless tobacco: Former Network engineer Use Topics  . Alcohol use: Yes    Alcohol/week: 1.0 standard drinks    Types: 1 Standard drinks or equivalent per week  . Drug use: No     Allergies   Crestor [rosuvastatin calcium]; Depakote [divalproex sodium]; Lipitor [atorvastatin]; Meclizine; Other; Simvastatin; Topamax [topiramate]; and Zonegran [zonisamide]   Review of Systems Review of Systems  Constitutional:       Per HPI, otherwise negative  HENT:       Per HPI, otherwise negative  Respiratory:       Per HPI, otherwise negative  Cardiovascular:       Per HPI, otherwise negative  Gastrointestinal: Positive for abdominal pain and nausea. Negative for vomiting.  Endocrine:       Negative aside from HPI  Genitourinary:       Neg aside from HPI   Musculoskeletal:       Per HPI, otherwise negative  Skin: Negative.   Neurological: Negative for syncope.     Physical Exam Updated Vital Signs BP 123/83   Pulse (!) 111   Temp 97.6 F (36.4 C) (Oral)   Resp 18   Ht 6\' 3"  (1.905 m)   Wt 129.3 kg   SpO2 94%   BMI 35.62 kg/m   Physical Exam  Constitutional: He is oriented to person, place, and time. He appears well-developed.  Obese male awake and alert speaking clearly  HENT:  Head: Normocephalic and atraumatic.  Eyes: Conjunctivae and EOM are normal.   Cardiovascular: Regular rhythm. Tachycardia present.  Pulmonary/Chest: Effort normal. No stridor. No respiratory distress.  Abdominal: He exhibits no distension. There is generalized tenderness and tenderness in the right lower quadrant, periumbilical area and suprapubic area. There is guarding.  Musculoskeletal: He exhibits no edema.  Neurological: He is alert and oriented to person, place, and time.  Skin: Skin is warm and dry.  Psychiatric: He has a normal mood and affect.  Nursing note and vitals reviewed.    ED Treatments / Results  Labs (all labs ordered are listed, but only abnormal results are  displayed) Labs Reviewed  COMPREHENSIVE METABOLIC PANEL - Abnormal; Notable for the following components:      Result Value   Sodium 134 (*)    CO2 19 (*)    Glucose, Bld 200 (*)    Calcium 8.6 (*)    ALT 67 (*)    All other components within normal limits  CBC - Abnormal; Notable for the following components:   WBC 18.6 (*)    All other components within normal limits  LIPASE, BLOOD  URINALYSIS, ROUTINE W REFLEX MICROSCOPIC    EKG None  Radiology Ct Abdomen Pelvis W Contrast  Result Date: 06/23/2018 CLINICAL DATA:  52 year old male with acute RIGHT abdominal and pelvic pain for 2 days. EXAM: CT ABDOMEN AND PELVIS WITH CONTRAST TECHNIQUE: Multidetector CT imaging of the abdomen and pelvis was performed using the standard protocol following bolus administration of intravenous contrast. CONTRAST:  139mL ISOVUE-300 IOPAMIDOL (ISOVUE-300) INJECTION 61% COMPARISON:  12/27/2016 CT FINDINGS: Lower chest: Mild bibasilar atelectasis noted. Hepatobiliary: Mild hepatic steatosis noted without focal hepatic abnormality. The gallbladder is unremarkable. No biliary dilatation. Pancreas: Unremarkable Spleen: Unremarkable Adrenals/Urinary Tract: Moderate to large RIGHT subcapsular perinephric hematoma with areas of posterior and inferior active arterial extravasation noted. A moderate to large  amount of ill-defined hemorrhage extends into the RIGHT retroperitoneum. The source of this hemorrhage is not determined on this study. Only small cysts were present in the RIGHT kidney on the 12/27/2016 CT. No hydronephrosis. The LEFT kidney, adrenal glands and bladder are unremarkable except for small LEFT renal cysts. Stomach/Bowel: Stomach is within normal limits. Appendix appears normal. No evidence of bowel wall thickening, distention, or inflammatory changes. Vascular/Lymphatic: Aortic atherosclerosis. No enlarged abdominal or pelvic lymph nodes. Reproductive: Prostate is unremarkable. Other: No ascites or pneumoperitoneum. Musculoskeletal: No acute or suspicious bony abnormalities. IMPRESSION: 1. Moderate to large RIGHT subcapsular perinephric hematoma with active arterial extravasation. Extension of moderate to large amount of ill-defined hemorrhage into the RIGHT retroperitoneum. The cause of this hemorrhage is not determined on this study. 2. Mild hepatic steatosis 3.  Aortic Atherosclerosis (ICD10-I70.0). Critical Value/emergent results were called by telephone at the time of interpretation on 06/23/2018 at 9:40 pm to Dr. Vanita Panda , who verbally acknowledged these results. Electronically Signed   By: Margarette Canada M.D.   On: 06/23/2018 21:43    Procedures Procedures (including critical care time)  Medications Ordered in ED Medications  0.9 %  sodium chloride infusion ( Intravenous New Bag/Given 06/23/18 1932)  HYDROmorphone (DILAUDID) injection 1 mg (has no administration in time range)  ciprofloxacin (CIPRO) IVPB 400 mg (has no administration in time range)  ondansetron (ZOFRAN) injection 4 mg (4 mg Intravenous Given 06/23/18 1930)  HYDROmorphone (DILAUDID) injection 1 mg (1 mg Intravenous Given 06/23/18 1930)  HYDROmorphone (DILAUDID) injection 1 mg (1 mg Intravenous Given 06/23/18 2100)  iopamidol (ISOVUE-300) 61 % injection 100 mL (100 mLs Intravenous Contrast Given 06/23/18 2108)      Initial Impression / Assessment and Plan / ED Course  I have reviewed the triage vital signs and the nursing notes.  Pertinent labs & imaging results that were available during my care of the patient were reviewed by me and considered in my medical decision making (see chart for details).     After the patient had CT scan performed I reviewed the images myself, subsequently discussed with the radiologist, and then our urologist. Given concern for perinephric hemorrhage, the patient will start empiric antibiotics, require transfer to our affiliated care center.   10:05  PM Patient awake and alert. He and his wife are aware of all findings, including perinephric hemorrhage, need for transfer, need for serial blood draws, and possible IR intervention.  (late chart review: patient transferred w/o complication to our affiliated hospital w urology and IR services.)  Final Clinical Impressions(s) / ED Diagnoses   Final diagnoses:  Perinephric hematoma     Carmin Muskrat, MD 06/23/18 2207  CRITICAL CARE Performed by: Carmin Muskrat Total critical care time: 35 minutes Critical care time was exclusive of separately billable procedures and treating other patients. Critical care was necessary to treat or prevent imminent or life-threatening deterioration. Critical care was time spent personally by me on the following activities: development of treatment plan with patient and/or surrogate as well as nursing, discussions with consultants, evaluation of patient's response to treatment, examination of patient, obtaining history from patient or surrogate, ordering and performing treatments and interventions, ordering and review of laboratory studies, ordering and review of radiographic studies, pulse oximetry and re-evaluation of patient's condition.     Carmin Muskrat, MD 07/09/18 2350

## 2018-06-23 NOTE — Consult Note (Signed)
Reason for Consult: Right Perinephric Hematoma  Referring Physician: Carmin Muskrat MD  William Carson is an 52 y.o. male.   HPI:   1 - Right Perinephric Hematoma - right perinephric hematoma by ER Ct 06/23/18 on eval right abdominal pain. Cr <1. Hgb 13. Denies trauma. CT 12/2016 without any large / worrisome GU masses, ? Small AML. No fevers.  2 - Small Right Renal Lesions / ? Small AML - few scattered non-enhancing <2cm renal lesions by CT 12/2016. Rt mid posterios lesions with density in negative HU range prompting some suspicion of AML.  3 - Prostate Screening -  2019 - PSA 1.6.  PMH sig for DM1 (A1c 5-10), obesity, CAD/Stent.  Today, William Carson, is seen in consultation for unusual perinephric hematoma. He reports acute onset of mild left sided pain yesterday AM and subsequent significant increase in right mid abdominal pain yesterday at 1pm. Voiding without issue, but does report his urine "stinks" and looks like iced tea. He denies a h/o easy bleeding, but him and his wife both report that he seems to bruise easily. Denies history of trauma to the right flank or side.   Since presentation to the ED, his hemoglobin has decreased from 15.3 to 13.3 to 11.5 to 10.9 over the course of 17 hours.   Of note, his sister has "lost both her kidneys" due to a disease that they do not know and has had two kidney transplants.   Takes ASA 81 daily for history of CAD / cardiac stent.   Past Medical History:  Diagnosis Date  . Adenomatous polyps   . COPD (chronic obstructive pulmonary disease) ([place])   . Coronary artery disease   . Diabetes mellitus without complication ([place])   . Diverticulosis   . Hemorrhoids   . Hyperlipidemia   . Influenza A 10/20/2015  . Obesity     Past Surgical History:  Procedure Laterality Date  . ANTERIOR CRUCIATE LIGAMENT REPAIR     S/P  . CORONARY STENT PLACEMENT  2011   2 stents    Family History  Problem Relation Age of Onset  . Heart attack Mother         Infarction  . Arrhythmia Father        Atrial fibrillation  . Heart attack Maternal Grandfather   . Heart attack Paternal Grandfather     Social History:  reports that he quit smoking about 2 years ago. His smoking use included cigarettes. He has a 45.00 pack-year smoking history. He has quit using smokeless tobacco. He reports that he drinks about 1.0 standard drinks of alcohol per week. He reports that he does not use drugs.  Allergies:  Allergies  Allergen Reactions  . Crestor [Rosuvastatin Calcium] Other (See Comments)    myalgias  . Depakote [Divalproex Sodium]     Stomach pains  . Lipitor [Atorvastatin] Other (See Comments)    Myalgias   . Meclizine     Increased dizziness  . Other Swelling    Mammelain meat allergy  . Simvastatin Other (See Comments)    Myalgias   . Topamax [Topiramate]     Back pain, tinnitus  . Zonegran [Zonisamide]     Increased headache    Medications: I have reviewed the patient's current medications.  Results for orders placed or performed during the hospital encounter of 06/23/18 (from the past 48 hour(s))  Lipase, blood     Status: None   Collection Time: 06/23/18  6:57 PM  Result Value Ref Range  Lipase 29 11 - 51 U/L    Comment: Performed at [facility], [address] [place], [place] [address]  Comprehensive metabolic panel     Status: Abnormal   Collection Time: 06/23/18  6:57 PM  Result Value Ref Range   Sodium 134 (L) 135 - 145 mmol/L   Potassium 3.9 3.5 - 5.1 mmol/L   Chloride 107 98 - 111 mmol/L   CO2 19 (L) 22 - 32 mmol/L   Glucose, Bld 200 (H) 70 - 99 mg/dL   BUN 13 6 - 20 mg/dL   Creatinine, Ser 0.94 0.61 - 1.24 mg/dL   Calcium 8.6 (L) 8.9 - 10.3 mg/dL   Total Protein 6.9 6.5 - 8.1 g/dL   Albumin 4.1 3.5 - 5.0 g/dL   AST 35 15 - 41 U/L   ALT 67 (H) 0 - 44 U/L   Alkaline Phosphatase 39 38 - 126 U/L   Total Bilirubin 0.5 0.3 - 1.2 mg/dL   GFR calc non Af Amer >60 >60 mL/min   GFR calc Af Amer >60 >60 mL/min   Anion  gap 8 5 - 15    Comment: Performed at [facility], [address] [place], [place] [address]  CBC     Status: Abnormal   Collection Time: 06/23/18  6:57 PM  Result Value Ref Range   WBC 18.6 (H) 4.0 - 10.5 K/uL   RBC 4.47 4.22 - 5.81 MIL/uL   Hemoglobin 13.3 13.0 - 17.0 g/dL   HCT 42.3 39.0 - 52.0 %   MCV 94.6 80.0 - 100.0 fL   MCH 29.8 26.0 - 34.0 pg   MCHC 31.4 30.0 - 36.0 g/dL   RDW 14.6 11.5 - 15.5 %   Platelets 322 150 - 400 K/uL   nRBC 0.0 0.0 - 0.2 %    Comment: Performed at [facility], [address] [place], [place] [address]  Urinalysis, Routine w reflex microscopic     Status: Abnormal   Collection Time: 06/23/18  6:57 PM  Result Value Ref Range   Color, Urine YELLOW YELLOW   APPearance CLEAR CLEAR   Specific Gravity, Urine >1.046 (H) 1.005 - 1.030   pH 5.0 5.0 - 8.0   Glucose, UA NEGATIVE NEGATIVE mg/dL   Hgb urine dipstick MODERATE (A) NEGATIVE   Bilirubin Urine NEGATIVE NEGATIVE   Ketones, ur NEGATIVE NEGATIVE mg/dL   Protein, ur 30 (A) NEGATIVE mg/dL   Nitrite NEGATIVE NEGATIVE   Leukocytes, UA NEGATIVE NEGATIVE   RBC / HPF 21-50 0 - 5 RBC/hpf   WBC, UA 0-5 0 - 5 WBC/hpf   Bacteria, UA NONE SEEN NONE SEEN   Squamous Epithelial / LPF 0-5 0 - 5   Mucus PRESENT     Comment: Performed at [facility], [address], [place], [place] [address]  Magnesium     Status: None   Collection Time: 06/23/18  6:57 PM  Result Value Ref Range   Magnesium 1.8 1.7 - 2.4 mg/dL    Comment: Performed at [facility], [address] [place], [place] [address]  Phosphorus     Status: None   Collection Time: 06/23/18  6:57 PM  Result Value Ref Range   Phosphorus 3.8 2.5 - 4.6 mg/dL    Comment: Performed at [facility], [address] [place], [place] [address]  Hemoglobin and hematocrit, blood     Status: Abnormal   Collection Time: 06/24/18 12:36 AM  Result Value Ref Range   Hemoglobin 11.5 (L) 13.0 -  17.0 g/dL   HCT 38.2 (L) 39.0 - 52.0 %    Comment: Performed at [facility], [address], [place], [place] [address]  Protime-INR     Status: None   Collection Time: 06/24/18 12:36 AM  Result Value Ref Range   Prothrombin Time 13.4 11.4 - 15.2 seconds   INR 1.03     Comment: Performed at [facility], [address], [place], [place] [address]  APTT     Status: None   Collection Time: 06/24/18 12:36 AM  Result Value Ref Range   aPTT 35 24 - 36 seconds    Comment: Performed at [facility], [address] [place], [place] [address]  Type and screen [facility]     Status: None   Collection Time: 06/24/18  4:20 AM  Result Value Ref Range   ABO/RH(D) O POS    Antibody Screen NEG    Sample Expiration      06/27/2018 Performed at [facility], [address], [place], [place] [address]   CBC WITH DIFFERENTIAL     Status: Abnormal   Collection Time: 06/24/18  7:16 AM  Result Value Ref Range   WBC 16.8 (H) 4.0 - 10.5 K/uL   RBC 3.61 (L) 4.22 - 5.81 MIL/uL   Hemoglobin 10.9 (L) 13.0 - 17.0 g/dL   HCT 35.1 (L) 39.0 - 52.0 %   MCV 97.2 80.0 - 100.0 fL   MCH 30.2 26.0 - 34.0 pg   MCHC 31.1 30.0 - 36.0 g/dL   RDW 14.7 11.5 - 15.5 %   Platelets 298 150 - 400 K/uL   nRBC 0.0 0.0 - 0.2 %   Neutrophils Relative % 77 %   Neutro Abs 12.9 (H) 1.7 - 7.7 K/uL   Lymphocytes Relative 8 %   Lymphs Abs 1.4 0.7 - 4.0 K/uL   Monocytes Relative 14 %   Monocytes Absolute 2.3 (H) 0.1 - 1.0 K/uL   Eosinophils Relative 0 %   Eosinophils Absolute 0.0 0.0 - 0.5 K/uL   Basophils Relative 0 %   Basophils Absolute 0.1 0.0 - 0.1 K/uL   Immature Granulocytes 1 %   Abs Immature Granulocytes 0.12 (H) 0.00 - 0.07 K/uL    Comment: Performed at [facility], [address], [place], [place] [address]  Comprehensive metabolic panel     Status: Abnormal   Collection Time: 06/24/18  7:16 AM  Result Value Ref Range   Sodium 135 135 - 145 mmol/L   Potassium 5.1 3.5 - 5.1 mmol/L    Comment: DELTA CHECK NOTED   Chloride 108 98 - 111 mmol/L   CO2 22 22 - 32 mmol/L    Glucose, Bld 190 (H) 70 - 99 mg/dL   BUN 17 6 - 20 mg/dL   Creatinine, Ser 0.98 0.61 - 1.24 mg/dL   Calcium 7.9 (L) 8.9 - 10.3 mg/dL   Total Protein 6.3 (L) 6.5 - 8.1 g/dL   Albumin 3.6 3.5 - 5.0 g/dL   AST 24 15 - 41 U/L   ALT 55 (H) 0 - 44 U/L   Alkaline Phosphatase 32 (L) 38 - 126 U/L   Total Bilirubin 0.4 0.3 - 1.2 mg/dL   GFR calc non Af Amer >60 >60 mL/min   GFR calc Af Amer >60 >60 mL/min   Anion gap 5 5 - 15    Comment: Performed at [facility], [address] [place], [place] [address]    Ct Abdomen Pelvis W Contrast  Result Date: 06/23/2018 CLINICAL DATA:  52 year old male with acute RIGHT abdominal and pelvic pain for 2 days. EXAM: CT ABDOMEN AND PELVIS WITH CONTRAST TECHNIQUE: Multidetector CT imaging of the abdomen and pelvis was performed using the standard protocol following bolus administration of intravenous contrast. CONTRAST:  13mL ISOVUE-300 IOPAMIDOL (ISOVUE-300) INJECTION 61% COMPARISON:  12/27/2016 CT FINDINGS: Lower chest: Mild bibasilar atelectasis noted. Hepatobiliary: Mild hepatic steatosis noted without focal hepatic abnormality. The gallbladder is unremarkable. No biliary dilatation. Pancreas: Unremarkable Spleen: Unremarkable Adrenals/Urinary Tract: Moderate to large RIGHT subcapsular perinephric hematoma with areas of posterior and inferior active arterial extravasation noted. A moderate to large amount of ill-defined hemorrhage extends into the RIGHT retroperitoneum. The source of this hemorrhage is not determined on this study. Only small cysts were present in the RIGHT kidney on the 12/27/2016 CT. No hydronephrosis. The LEFT kidney, adrenal glands and bladder are unremarkable except for small LEFT renal cysts. Stomach/Bowel: Stomach is within normal limits. Appendix appears normal. No evidence of bowel wall thickening, distention, or inflammatory changes. Vascular/Lymphatic: Aortic atherosclerosis. No enlarged abdominal or pelvic lymph nodes. Reproductive:  Prostate is unremarkable. Other: No ascites or pneumoperitoneum. Musculoskeletal: No acute or suspicious bony abnormalities. IMPRESSION: 1. Moderate to large RIGHT subcapsular perinephric hematoma with active arterial extravasation. Extension of moderate to large amount of ill-defined hemorrhage into the RIGHT retroperitoneum. The cause of this hemorrhage is not determined on this study. 2. Mild hepatic steatosis 3.  Aortic Atherosclerosis (ICD10-I70.0). Critical Value/emergent results were called by telephone at the time of interpretation on 06/23/2018 at 9:40 pm to Dr. Vanita Panda , who verbally acknowledged these results. Electronically Signed   By: Margarette Canada M.D.   On: 06/23/2018 21:43    ROS Blood pressure 138/80, pulse 93, temperature (!) 97.3 F (36.3 C), temperature source Oral, resp. rate 14, height 6\' 3"  (1.905 m), weight 129.3 kg, SpO2 94 %. Physical Exam  General: uncomfortable, lying in bed. Resp: NWOB on RA Abdominal: Right mid abdominal tenderness to palpation, distended, obese abdomen, no left sided tenderness GU: mild right CVA tenderness, no left CVA tenderness Extremities: warm and well perfused   Assessment/Plan:  1 - Right Perinephric Hematoma - very unusual w/o underlying overt mass, trauma, or known prior easy bleeding state.  Recommend bedrest, NPO, serial H/H. If deemed clinically appropriate or if Hgb < 8, recommend blood transfusion given CAD history. If does not respond to transfusion x 2, consider IR embolization. No surgical indications at this point. Also recommend proph dosing of po antibiotics at this point to prevent superinfection.   Consider medical hematologic eval to r/o easy bleeding state.   2 - Small Right Renal Lesions / ? Small AML - lesions small, no mass effect. Possible, though highly unusual at this size could be source of spontaneous bleed, especially in mid lesions with negative HU density.  3 - Prostate Screening - up to date this year.

## 2018-06-23 NOTE — ED Triage Notes (Signed)
Pt c/o right abd pain x 2 days. He denies any n/v/d.

## 2018-06-23 NOTE — H&P (Signed)
History and Physical    William Carson WPY:[id number] DOB: [birth year] DOA: 06/23/2018  PCP: Terald Sleeper, PA-C   Patient coming from: Home.  I have personally briefly reviewed patient's old medical records in [place]  Chief Complaint: Left flank pain.  HPI: William Carson is a 52 y.o. male with medical history significant of adenomatous polyps, COPD, CAD, type 2 diabetes, diverticulosis, hemorrhoids, hyperlipidemia, history of influenza A, obesity who is coming to the emergency department with complaints of progressively worse sharp left flank pain for the last 3 days associated with malaise, decreased appetite, nausea, but denies emesis, diarrhea or constipation.  The patient states that he initially thought that he was having an episode of diverticulitis, but did not have the expected changes in his BM pattern.  He denies fever, but states he has had chills and sweats.  He denies dysuria, frequency or hematuria.  He denies chest pain, palpitations, dizziness, PND, orthopnea or pitting edema of the lower extremities.  No polyuria, polydipsia, polyphagia or blurred vision.  ED Course: Initial vital signs temperature 97.6 F, pulse 110, respirations 20, blood pressure 123/81 mmHg and O2 sat 95% on room air.  Patient received a 1 L NS bolus, analgesics, antiemetics and ciprofloxacin 400 mg IVPB x1.  Urinalysis shows moderate hemoglobinuria, 30 mg/dL of proteinuria and 21-50 RBC/hpf.  White count was 18.6, hemoglobin 13.3 g/dL and platelets 322. CMP shows a sodium of 134 and CO2 of 19 mmol/L.  Glucose was 200, calcium 8.6 mg/dL and ALT 67 units/L.  All other values are within normal limits.  Lipase, phosphorus and magnesium levels were unremarkable.  Imaging: CT abdomen/pelvis with contrast showed moderate to large right subcapsular perinephric hematoma with active arterial extravasation.  Distention of moderate to large amount of ill-defined hemorrhage into the right retroperitoneum.   Unable to establish an etiology of these MRX with the study.  There is mild hepatic exostosis.  There is aortic atherosclerosis.  Please see images and full radiology report for further detail.  Review of Systems: As per HPI otherwise 10 point review of systems negative.   Past Medical History:  Diagnosis Date  . Adenomatous polyps   . COPD (chronic obstructive pulmonary disease) ([place])   . Coronary artery disease   . Diabetes mellitus without complication ([place])   . Diverticulosis   . Hemorrhoids   . Hyperlipidemia   . Influenza A 10/20/2015  . Obesity     Past Surgical History:  Procedure Laterality Date  . ANTERIOR CRUCIATE LIGAMENT REPAIR     S/P  . CORONARY STENT PLACEMENT  2011   2 stents     reports that he quit smoking about 2 years ago. His smoking use included cigarettes. He has a 45.00 pack-year smoking history. He has quit using smokeless tobacco. He reports that he drinks about 1.0 standard drinks of alcohol per week. He reports that he does not use drugs.  Allergies  Allergen Reactions  . Crestor [Rosuvastatin Calcium] Other (See Comments)    myalgias  . Depakote [Divalproex Sodium]     Stomach pains  . Lipitor [Atorvastatin] Other (See Comments)    Myalgias   . Meclizine     Increased dizziness  . Other Swelling    Mammelain meat allergy  . Simvastatin Other (See Comments)    Myalgias   . Topamax [Topiramate]     Back pain, tinnitus  . Zonegran [Zonisamide]     Increased headache    Family History  Problem  Relation Age of Onset  . Heart attack Mother        Infarction  . Arrhythmia Father        Atrial fibrillation  . Heart attack Maternal Grandfather   . Heart attack Paternal Grandfather    Prior to Admission medications   Medication Sig Start Date End Date Taking? Authorizing Provider  albuterol (PROVENTIL HFA;VENTOLIN HFA) 108 (90 Base) MCG/ACT inhaler Inhale 1-2 puffs into the lungs every 6 (six) hours as needed for wheezing or shortness of  breath. 08/15/17  Yes Timmothy Euler, MD  albuterol (PROVENTIL) (2.5 MG/3ML) 0.083% nebulizer solution Take 3 mLs (2.5 mg total) by nebulization every 4 (four) hours as needed for wheezing or shortness of breath. 10/31/15  Yes Timmothy Euler, MD  amLODipine (NORVASC) 5 MG tablet Take 1 tablet (5 mg total) by mouth daily. 12/16/17 06/23/18 Yes Lelon Perla, MD  aspirin 81 MG EC tablet Take 81 mg by mouth daily.     Yes [provider]  budesonide-formoterol (SYMBICORT) 160-4.5 MCG/ACT inhaler Inhale 2 puffs into the lungs 2 (two) times daily. Patient taking differently: Inhale 2 puffs into the lungs every morning.  03/30/18  Yes Jones, Angel S, PA-C  LIVALO 4 MG TABS TAKE 1 TABLET DAILY Patient taking differently: Take 4 mg by mouth every other day.  12/11/17  Yes Terald Sleeper, PA-C  nitroGLYCERIN (NITROSTAT) 0.4 MG SL tablet Place 1 tablet (0.4 mg total) under the tongue every 5 (five) minutes as needed for chest pain. May repeat up to 3 doses. 12/01/15  Yes Timmothy Euler, MD  oxyCODONE-acetaminophen (ROXICET) 5-325 MG tablet Take 1 tablet by mouth every 8 (eight) hours as needed for severe pain. 03/30/18  Yes Terald Sleeper, PA-C  SitaGLIPtin-MetFORMIN HCl (JANUMET XR) [phone] MG TB24 Take 1 tablet by mouth daily. 03/30/18  Yes Terald Sleeper, PA-C  venlafaxine XR (EFFEXOR XR) 150 MG 24 hr capsule Take 1 capsule (150 mg total) by mouth daily with breakfast. Patient taking differently: Take 150 mg by mouth at bedtime.  04/22/18  Yes Kathrynn Ducking, MD  glucose blood [facility] VERIO) test strip Use to check BG up to once daily 05/14/16   Cherre Robins, PharmD  [facility] DELICA LANCETS 67R MISC Use to check BG up to once daily 05/14/16   Cherre Robins, PharmD    Physical Exam: Vitals:   06/23/18 2030 06/23/18 2100 06/23/18 2130 06/23/18 2200  BP: (!) 145/93 136/69 123/83 (!) 128/95  Pulse: (!) 103 (!) 109 (!) 109 (!) 110  Resp: 17 16 18 17   Temp:      TempSrc:      SpO2: 95%  93% 93% 94%  Weight:      Height:        Constitutional: NAD, calm, comfortable Eyes: PERRL, lids and conjunctivae normal ENMT: Mucous membranes are moist. Posterior pharynx clear of any exudate or lesions. Neck: normal, supple, no masses, no thyromegaly Respiratory: Decreased breath sounds on bases, otherwise clear to auscultation bilaterally, no wheezing, no crackles. Normal respiratory effort. No accessory muscle use.  Cardiovascular: Tachycardic at 110 bpm, no murmurs / rubs / gallops. No extremity edema. 2+ pedal pulses. No carotid bruits.  Abdomen: Obese, soft, positive RUQ and RLQ tenderness, positive CVA tenderness, no guarding or rebound, no masses palpated. No hepatosplenomegaly. Bowel sounds positive.  Musculoskeletal: no clubbing / cyanosis. Good ROM, no contractures. Normal muscle tone.  Skin: no rashes, lesions, ulcers on limited dermatological examination. Neurologic: CN 2-12  grossly intact. Sensation intact, DTR normal. Strength 5/5 in all 4.  Psychiatric: Normal judgment and insight. Alert and oriented x 3. Normal mood.   Labs on Admission: I have personally reviewed following labs and imaging studies  CBC: Recent Labs  Lab 06/23/18 1857  WBC 18.6*  HGB 13.3  HCT 42.3  MCV 94.6  PLT 299   Basic Metabolic Panel: Recent Labs  Lab 06/23/18 1857  NA 134*  K 3.9  CL 107  CO2 19*  GLUCOSE 200*  BUN 13  CREATININE 0.94  CALCIUM 8.6*   GFR: Estimated Creatinine Clearance: 131.6 mL/min (by C-G formula based on SCr of 0.94 mg/dL). Liver Function Tests: Recent Labs  Lab 06/23/18 1857  AST 35  ALT 67*  ALKPHOS 39  BILITOT 0.5  PROT 6.9  ALBUMIN 4.1   Recent Labs  Lab 06/23/18 1857  LIPASE 29   No results for input(s): AMMONIA in the last 168 hours. Coagulation Profile: No results for input(s): INR, PROTIME in the last 168 hours. Cardiac Enzymes: No results for input(s): CKTOTAL, CKMB, CKMBINDEX, TROPONINI in the last 168 hours. BNP (last 3  results) No results for input(s): PROBNP in the last 8760 hours. HbA1C: No results for input(s): HGBA1C in the last 72 hours. CBG: No results for input(s): GLUCAP in the last 168 hours. Lipid Profile: No results for input(s): CHOL, HDL, LDLCALC, TRIG, CHOLHDL, LDLDIRECT in the last 72 hours. Thyroid Function Tests: No results for input(s): TSH, T4TOTAL, FREET4, T3FREE, THYROIDAB in the last 72 hours. Anemia Panel: No results for input(s): VITAMINB12, FOLATE, FERRITIN, TIBC, IRON, RETICCTPCT in the last 72 hours. Urine analysis:    Component Value Date/Time   COLORURINE YELLOW 10/18/2015 1115   APPEARANCEUR Clear 12/24/2016 1029   LABSPEC 1.030 10/18/2015 1115   PHURINE 6.0 10/18/2015 1115   GLUCOSEU Negative 12/24/2016 1029   HGBUR NEGATIVE 10/18/2015 1115   BILIRUBINUR Negative 12/24/2016 1029   KETONESUR NEGATIVE 10/18/2015 1115   PROTEINUR Negative 12/24/2016 1029   PROTEINUR 30 (A) 10/18/2015 1115   UROBILINOGEN 0.2 01/11/2011 0826   NITRITE Negative 12/24/2016 1029   NITRITE NEGATIVE 10/18/2015 1115   LEUKOCYTESUR Negative 12/24/2016 1029    Radiological Exams on Admission: Ct Abdomen Pelvis W Contrast  Result Date: 06/23/2018 CLINICAL DATA:  52 year old male with acute RIGHT abdominal and pelvic pain for 2 days. EXAM: CT ABDOMEN AND PELVIS WITH CONTRAST TECHNIQUE: Multidetector CT imaging of the abdomen and pelvis was performed using the standard protocol following bolus administration of intravenous contrast. CONTRAST:  126mL ISOVUE-300 IOPAMIDOL (ISOVUE-300) INJECTION 61% COMPARISON:  12/27/2016 CT FINDINGS: Lower chest: Mild bibasilar atelectasis noted. Hepatobiliary: Mild hepatic steatosis noted without focal hepatic abnormality. The gallbladder is unremarkable. No biliary dilatation. Pancreas: Unremarkable Spleen: Unremarkable Adrenals/Urinary Tract: Moderate to large RIGHT subcapsular perinephric hematoma with areas of posterior and inferior active arterial extravasation  noted. A moderate to large amount of ill-defined hemorrhage extends into the RIGHT retroperitoneum. The source of this hemorrhage is not determined on this study. Only small cysts were present in the RIGHT kidney on the 12/27/2016 CT. No hydronephrosis. The LEFT kidney, adrenal glands and bladder are unremarkable except for small LEFT renal cysts. Stomach/Bowel: Stomach is within normal limits. Appendix appears normal. No evidence of bowel wall thickening, distention, or inflammatory changes. Vascular/Lymphatic: Aortic atherosclerosis. No enlarged abdominal or pelvic lymph nodes. Reproductive: Prostate is unremarkable. Other: No ascites or pneumoperitoneum. Musculoskeletal: No acute or suspicious bony abnormalities. IMPRESSION: 1. Moderate to large RIGHT subcapsular perinephric hematoma with active  arterial extravasation. Extension of moderate to large amount of ill-defined hemorrhage into the RIGHT retroperitoneum. The cause of this hemorrhage is not determined on this study. 2. Mild hepatic steatosis 3.  Aortic Atherosclerosis (ICD10-I70.0). Critical Value/emergent results were called by telephone at the time of interpretation on 06/23/2018 at 9:40 pm to Dr. Vanita Panda , who verbally acknowledged these results. Electronically Signed   By: Margarette Canada M.D.   On: 06/23/2018 21:43    EKG: Independently reviewed.   Assessment/Plan Principal Problem:   Perinephric hematoma Admit to telemetry at [facility]. Keep n.p.o. Continue IV fluids. Monitor hematocrit and hemoglobin. Analgesics as needed. Antiemetics as needed. Hold aspirin Consult urology and/or IR once the patient arrives to [facility].  Active Problems:   Hyperlipidemia Hold Livalo.    CAD S/P percutaneous coronary angioplasty Daily 81 mg aspirin is on hold.    COPD (chronic obstructive pulmonary disease) (HCC) Continue supplemental oxygen. Continue Symbicort twice a day. Bronchodilators as needed.    T2DM (type 2 diabetes mellitus)  (HCC) Currently n.p.o. CBG monitoring every 6 hours. Switch to before meals and at bedtime once clear for oral intake.    DVT prophylaxis: SCDs. Code Status: Full code. Family Communication: Disposition Plan: Admit to [facility] for urology and/or IR evaluation. Consults called: Urology was consulted by Dr. Vanita Panda. Admission status: Inpatient/telemetry.   Reubin Milan MD [facility] Pager [phone].  If 7PM-7AM, please contact night-coverage [email or web address] Password [facility]  06/23/2018, 10:22 PM

## 2018-06-24 ENCOUNTER — Other Ambulatory Visit: Payer: Self-pay

## 2018-06-24 DIAGNOSIS — J438 Other emphysema: Secondary | ICD-10-CM

## 2018-06-24 DIAGNOSIS — E7800 Pure hypercholesterolemia, unspecified: Secondary | ICD-10-CM

## 2018-06-24 DIAGNOSIS — E119 Type 2 diabetes mellitus without complications: Secondary | ICD-10-CM

## 2018-06-24 DIAGNOSIS — I2510 Atherosclerotic heart disease of native coronary artery without angina pectoris: Secondary | ICD-10-CM

## 2018-06-24 DIAGNOSIS — Z9861 Coronary angioplasty status: Secondary | ICD-10-CM

## 2018-06-24 LAB — CBC WITH DIFFERENTIAL/PLATELET
Abs Immature Granulocytes: 0.12 10*3/uL — ABNORMAL HIGH (ref 0.00–0.07)
Basophils Absolute: 0.1 10*3/uL (ref 0.0–0.1)
Basophils Relative: 0 %
Eosinophils Absolute: 0 10*3/uL (ref 0.0–0.5)
Eosinophils Relative: 0 %
HCT: 35.1 % — ABNORMAL LOW (ref 39.0–52.0)
Hemoglobin: 10.9 g/dL — ABNORMAL LOW (ref 13.0–17.0)
Immature Granulocytes: 1 %
Lymphocytes Relative: 8 %
Lymphs Abs: 1.4 10*3/uL (ref 0.7–4.0)
MCH: 30.2 pg (ref 26.0–34.0)
MCHC: 31.1 g/dL (ref 30.0–36.0)
MCV: 97.2 fL (ref 80.0–100.0)
Monocytes Absolute: 2.3 10*3/uL — ABNORMAL HIGH (ref 0.1–1.0)
Monocytes Relative: 14 %
NRBC: 0 % (ref 0.0–0.2)
Neutro Abs: 12.9 10*3/uL — ABNORMAL HIGH (ref 1.7–7.7)
Neutrophils Relative %: 77 %
Platelets: 298 10*3/uL (ref 150–400)
RBC: 3.61 MIL/uL — ABNORMAL LOW (ref 4.22–5.81)
RDW: 14.7 % (ref 11.5–15.5)
WBC: 16.8 10*3/uL — ABNORMAL HIGH (ref 4.0–10.5)

## 2018-06-24 LAB — HEMOGLOBIN AND HEMATOCRIT, BLOOD
HCT: 28.6 % — ABNORMAL LOW (ref 39.0–52.0)
HCT: 38.2 % — ABNORMAL LOW (ref 39.0–52.0)
Hemoglobin: 11.5 g/dL — ABNORMAL LOW (ref 13.0–17.0)
Hemoglobin: 8.8 g/dL — ABNORMAL LOW (ref 13.0–17.0)

## 2018-06-24 LAB — COMPREHENSIVE METABOLIC PANEL
ALT: 55 U/L — ABNORMAL HIGH (ref 0–44)
ANION GAP: 5 (ref 5–15)
AST: 24 U/L (ref 15–41)
Albumin: 3.6 g/dL (ref 3.5–5.0)
Alkaline Phosphatase: 32 U/L — ABNORMAL LOW (ref 38–126)
BUN: 17 mg/dL (ref 6–20)
CO2: 22 mmol/L (ref 22–32)
Calcium: 7.9 mg/dL — ABNORMAL LOW (ref 8.9–10.3)
Chloride: 108 mmol/L (ref 98–111)
Creatinine, Ser: 0.98 mg/dL (ref 0.61–1.24)
GFR calc Af Amer: >60 mL/min (ref >60)
GFR calc non Af Amer: >60 mL/min (ref >60)
Glucose, Bld: 190 mg/dL — ABNORMAL HIGH (ref 70–99)
Potassium: 5.1 mmol/L (ref 3.5–5.1)
Sodium: 135 mmol/L (ref 135–145)
Total Bilirubin: 0.4 mg/dL (ref 0.3–1.2)
Total Protein: 6.3 g/dL — ABNORMAL LOW (ref 6.5–8.1)

## 2018-06-24 LAB — TYPE AND SCREEN
ABO/RH(D): O POS
Antibody Screen: NEGATIVE

## 2018-06-24 LAB — PROTIME-INR
INR: 1.03
Prothrombin Time: 13.4 seconds (ref 11.4–15.2)

## 2018-06-24 LAB — APTT: aPTT: 35 seconds (ref 24–36)

## 2018-06-24 MED ORDER — OXYCODONE-ACETAMINOPHEN 5-325 MG PO TABS
1.0000 | ORAL_TABLET | Freq: Three times a day (TID) | ORAL | Status: DC | PRN
  Administered 2018-06-25 – 2018-06-26 (×2): 1 via ORAL
  Filled 2018-06-24 (×2): qty 1

## 2018-06-24 MED ORDER — ALBUTEROL SULFATE (2.5 MG/3ML) 0.083% IN NEBU
2.5000 mg | INHALATION_SOLUTION | RESPIRATORY_TRACT | Status: DC | PRN
  Administered 2018-06-26: 2.5 mg via RESPIRATORY_TRACT
  Filled 2018-06-24: qty 3

## 2018-06-24 MED ORDER — CIPROFLOXACIN IN D5W 400 MG/200ML IV SOLN
400.0000 mg | Freq: Two times a day (BID) | INTRAVENOUS | Status: DC
  Administered 2018-06-24 – 2018-06-27 (×6): 400 mg via INTRAVENOUS
  Filled 2018-06-24 (×7): qty 200

## 2018-06-24 MED ORDER — INFLUENZA VAC SPLIT QUAD 0.5 ML IM SUSY
0.5000 mL | PREFILLED_SYRINGE | INTRAMUSCULAR | Status: AC
  Administered 2018-06-26: 0.5 mL via INTRAMUSCULAR
  Filled 2018-06-24: qty 0.5

## 2018-06-24 MED ORDER — VENLAFAXINE HCL ER 150 MG PO CP24
150.0000 mg | ORAL_CAPSULE | Freq: Every day | ORAL | Status: DC
  Administered 2018-06-24 – 2018-06-26 (×3): 150 mg via ORAL
  Filled 2018-06-24 (×3): qty 1

## 2018-06-24 MED ORDER — MOMETASONE FURO-FORMOTEROL FUM 200-5 MCG/ACT IN AERO
2.0000 | INHALATION_SPRAY | Freq: Two times a day (BID) | RESPIRATORY_TRACT | Status: DC
  Administered 2018-06-25 – 2018-06-27 (×5): 2 via RESPIRATORY_TRACT
  Filled 2018-06-24 (×2): qty 8.8

## 2018-06-24 MED ORDER — SODIUM CHLORIDE 0.9% IV SOLUTION: Freq: Once | INTRAVENOUS | Status: DC

## 2018-06-24 MED ORDER — NITROGLYCERIN 0.4 MG SL SUBL: 0.4000 mg | SUBLINGUAL_TABLET | SUBLINGUAL | Status: DC | PRN

## 2018-06-24 NOTE — Progress Notes (Signed)
Received pt from [facility], pt complain of rt lower Abd tenderness 8/10. Pt guarding area of abd. Spouse at bedside with patient. Pt VSs and currently NPO. Oriented to room. SRP, RN

## 2018-06-24 NOTE — Progress Notes (Addendum)
52 yo with hx of adenomatous polyps, COPD, CAD, T2DM, diverticulosis and multiple other medical problems who presented with abdominal discomfort and was found to have moderate to large R subcapsular perinephric hematoma with active arterial extravasation (see report).  He was seen by urology recommending bedrest, serial H/H, transfusion for <8 or as indicated. If not responding to transfusion x2 recommending IR embolization.  Pt currently on ciprofloxacin.  Consider heme eval. Pt with stable vitals, normal HR and BP.  Exam notable for mild abdominal TTP on exam.  His hemoglobin has notably downtrended from 13.3 on presentation to 8.8 here.  Transfuse for < 8 or hemodynamic instability.  Continue to follow serial H/H.  Per discussion with urology, recommending transfusion prior to IR embolization, if requires multiple transfusion, would consult IR for embolization.  See progress note from Dr. Wynetta Emery and consult note from urology.

## 2018-06-24 NOTE — ED Notes (Signed)
Pt off the unit at this time via [facility], previously spoke with charge nurse, Wille Glaser at [facility] for report as pt was being transferred ED to ED, since that time pt has an inpatient room assigned but bed is not ready, relayed information to [facility] and to spouse, will attempt to call report to floor when bed is ready

## 2018-06-24 NOTE — ED Provider Notes (Signed)
Patient will be transferred to [facility] long emergency department for urology to consult along with admission to [facility] long by hospitalist and interventional radiologist will be draining the perinephric hematoma   Milton Ferguson, MD 06/24/18 1154

## 2018-06-24 NOTE — ED Notes (Signed)
Report to Jillyn Ledger, Therapist, sports at [employer]

## 2018-06-24 NOTE — Progress Notes (Signed)
PROGRESS NOTE  William Carson  IWO:[id number]  DOB: 1965-09-20  DOA: 06/23/2018 PCP: Terald Sleeper, PA-C   Brief Admission Hx: 52 y.o. male with medical history significant of adenomatous polyps, COPD, CAD, type 2 diabetes, diverticulosis, hemorrhoids, hyperlipidemia, history of influenza A, obesity who is coming to the emergency department with complaints of progressively worse sharp left flank pain for the last 3 days associated with malaise, decreased appetite, nausea, but denies emesis, diarrhea or constipation.  He was admitted with a right perinephric hematoma.    MDM/Assessment & Plan:   1. Right perinephric hematoma-patient continues to have flank pain.  He has been waiting in the emergency department for over 18 hours at [facility]  I spoke with the ED physician and arrangements are being made for patient to be transferred to the ED at [facility] long so that he can have a urology consult and interventional radiology evaluation for possible drainage if appropriate.  I spoke with Dr. Roderic Palau who made arrangements for transfer to [facility] ED.  The patient's vital signs have remained stable.  His hemoglobin has dropped to 10.9 however there may be some hemodilution from IV fluids. 2. CAD  - Aspirin currently being held. 3. COPD- continue supplemental oxygen, Symbicort and bronchodilators. 4. Type 2 diabetes mellitus- currently patient is n.p.o. pending possible procedures, monitoring CBG every 6 hours.  Resume AC at bedtime testing after diet has been started.  DVT prophylaxis: SCDs Code Status: Full code Family Communication: Patient updated at bedside Disposition Plan: Transfer to [facility] long ED   Consultants:  [facility]  Procedures:  n/a  Antimicrobials:  Ciprofloxacin 12/3   Subjective: Patient continues to have right flank pain but otherwise feels well.  Objective: Vitals:   06/24/18 1000 06/24/18 1030 06/24/18 1100 06/24/18 1130  BP: 135/88 115/64 132/80 137/74    Pulse: (!) 103 97 71 97  Resp: 17 19 14 14   Temp:      TempSrc:      SpO2: 94% 95% 95% 95%  Weight:      Height:        Intake/Output Summary (Last 24 hours) at 06/24/2018 1155 Last data filed at 06/24/2018 0024 Gross per 24 hour  Intake 1700 ml  Output -  Net 1700 ml   Filed Weights   06/23/18 1855  Weight: 129.3 kg     REVIEW OF SYSTEMS  As per history otherwise all reviewed and reported negative  Exam:  General exam: Awake, alert, no distress, cooperative and pleasant. Respiratory system:  No increased work of breathing. Cardiovascular system: S1 & S2 heard, RRR. No JVD, murmurs, gallops, clicks or pedal edema. Gastrointestinal system: Abdomen is nondistended, soft and nontender. Normal bowel sounds heard. Central nervous system: Alert and oriented. No focal neurological deficits. Extremities: no cyanosis or clubbing.  Data Reviewed: Basic Metabolic Panel: Recent Labs  Lab 06/23/18 1857 06/24/18 0716  NA 134* 135  K 3.9 5.1  CL 107 108  CO2 19* 22  GLUCOSE 200* 190*  BUN 13 17  CREATININE 0.94 0.98  CALCIUM 8.6* 7.9*  MG 1.8  --   PHOS 3.8  --    Liver Function Tests: Recent Labs  Lab 06/23/18 1857 06/24/18 0716  AST 35 24  ALT 67* 55*  ALKPHOS 39 32*  BILITOT 0.5 0.4  PROT 6.9 6.3*  ALBUMIN 4.1 3.6   Recent Labs  Lab 06/23/18 1857  LIPASE 29   No results for input(s): AMMONIA in the last 168 hours. CBC:  Recent Labs  Lab 06/23/18 1857 06/24/18 0036 06/24/18 0716  WBC 18.6*  --  16.8*  NEUTROABS  --   --  12.9*  HGB 13.3 11.5* 10.9*  HCT 42.3 38.2* 35.1*  MCV 94.6  --  97.2  PLT 322  --  298   Cardiac Enzymes: No results for input(s): CKTOTAL, CKMB, CKMBINDEX, TROPONINI in the last 168 hours. CBG (last 3)  No results for input(s): GLUCAP in the last 72 hours. No results found for this or any previous visit (from the past 240 hour(s)).   Studies: Ct Abdomen Pelvis W Contrast  Result Date: 06/23/2018 CLINICAL DATA:   52 year old male with acute RIGHT abdominal and pelvic pain for 2 days. EXAM: CT ABDOMEN AND PELVIS WITH CONTRAST TECHNIQUE: Multidetector CT imaging of the abdomen and pelvis was performed using the standard protocol following bolus administration of intravenous contrast. CONTRAST:  159mL ISOVUE-300 IOPAMIDOL (ISOVUE-300) INJECTION 61% COMPARISON:  12/27/2016 CT FINDINGS: Lower chest: Mild bibasilar atelectasis noted. Hepatobiliary: Mild hepatic steatosis noted without focal hepatic abnormality. The gallbladder is unremarkable. No biliary dilatation. Pancreas: Unremarkable Spleen: Unremarkable Adrenals/Urinary Tract: Moderate to large RIGHT subcapsular perinephric hematoma with areas of posterior and inferior active arterial extravasation noted. A moderate to large amount of ill-defined hemorrhage extends into the RIGHT retroperitoneum. The source of this hemorrhage is not determined on this study. Only small cysts were present in the RIGHT kidney on the 12/27/2016 CT. No hydronephrosis. The LEFT kidney, adrenal glands and bladder are unremarkable except for small LEFT renal cysts. Stomach/Bowel: Stomach is within normal limits. Appendix appears normal. No evidence of bowel wall thickening, distention, or inflammatory changes. Vascular/Lymphatic: Aortic atherosclerosis. No enlarged abdominal or pelvic lymph nodes. Reproductive: Prostate is unremarkable. Other: No ascites or pneumoperitoneum. Musculoskeletal: No acute or suspicious bony abnormalities. IMPRESSION: 1. Moderate to large RIGHT subcapsular perinephric hematoma with active arterial extravasation. Extension of moderate to large amount of ill-defined hemorrhage into the RIGHT retroperitoneum. The cause of this hemorrhage is not determined on this study. 2. Mild hepatic steatosis 3.  Aortic Atherosclerosis (ICD10-I70.0). Critical Value/emergent results were called by telephone at the time of interpretation on 06/23/2018 at 9:40 pm to Dr. Vanita Panda , who  verbally acknowledged these results. Electronically Signed   By: Margarette Canada M.D.   On: 06/23/2018 21:43   Scheduled Meds: . mometasone-formoterol  2 puff Inhalation BID  . venlafaxine XR  150 mg Oral QHS   Continuous Infusions: . sodium chloride Stopped (06/23/18 2245)  . ciprofloxacin      Principal Problem:   Perinephric hematoma Active Problems:   Hyperlipidemia   CAD S/P percutaneous coronary angioplasty   COPD (chronic obstructive pulmonary disease) (HCC)   T2DM (type 2 diabetes mellitus) ([place])  Time spent:   Irwin Brakeman, MD, FAAFP [facility] Pager [phone]  If 7PM-7AM, please contact night-coverage [email or web address] Password [email or web address] 06/24/2018, 11:55 AM    LOS: 1 day

## 2018-06-25 ENCOUNTER — Inpatient Hospital Stay (HOSPITAL_COMMUNITY): Payer: BLUE CROSS/BLUE SHIELD

## 2018-06-25 LAB — CBC WITH DIFFERENTIAL/PLATELET
Abs Immature Granulocytes: 0.11 10*3/uL — ABNORMAL HIGH (ref 0.00–0.07)
BASOS PCT: 0 %
Basophils Absolute: 0.1 10*3/uL (ref 0.0–0.1)
Eosinophils Absolute: 0 10*3/uL (ref 0.0–0.5)
Eosinophils Relative: 0 %
HCT: 26.4 % — ABNORMAL LOW (ref 39.0–52.0)
Hemoglobin: 8.1 g/dL — ABNORMAL LOW (ref 13.0–17.0)
Immature Granulocytes: 1 %
Lymphocytes Relative: 13 %
Lymphs Abs: 1.7 10*3/uL (ref 0.7–4.0)
MCH: 30.1 pg (ref 26.0–34.0)
MCHC: 30.7 g/dL (ref 30.0–36.0)
MCV: 98.1 fL (ref 80.0–100.0)
Monocytes Absolute: 2 10*3/uL — ABNORMAL HIGH (ref 0.1–1.0)
Monocytes Relative: 16 %
Neutro Abs: 9 10*3/uL — ABNORMAL HIGH (ref 1.7–7.7)
Neutrophils Relative %: 70 %
Platelets: 202 10*3/uL (ref 150–400)
RBC: 2.69 MIL/uL — ABNORMAL LOW (ref 4.22–5.81)
RDW: 14.8 % (ref 11.5–15.5)
WBC: 12.8 10*3/uL — AB (ref 4.0–10.5)
nRBC: 0 % (ref 0.0–0.2)

## 2018-06-25 LAB — APTT: aPTT: 45 seconds — ABNORMAL HIGH (ref 24–36)

## 2018-06-25 LAB — COMPREHENSIVE METABOLIC PANEL
ALT: 42 U/L (ref 0–44)
ANION GAP: 5 (ref 5–15)
AST: 20 U/L (ref 15–41)
Albumin: 3.4 g/dL — ABNORMAL LOW (ref 3.5–5.0)
Alkaline Phosphatase: 28 U/L — ABNORMAL LOW (ref 38–126)
BUN: 13 mg/dL (ref 6–20)
CO2: 27 mmol/L (ref 22–32)
Calcium: 7.9 mg/dL — ABNORMAL LOW (ref 8.9–10.3)
Chloride: 106 mmol/L (ref 98–111)
Creatinine, Ser: 0.71 mg/dL (ref 0.61–1.24)
GFR calc non Af Amer: >60 mL/min (ref >60)
Glucose, Bld: 126 mg/dL — ABNORMAL HIGH (ref 70–99)
Potassium: 4.1 mmol/L (ref 3.5–5.1)
Sodium: 138 mmol/L (ref 135–145)
Total Bilirubin: 0.5 mg/dL (ref 0.3–1.2)
Total Protein: 5.7 g/dL — ABNORMAL LOW (ref 6.5–8.1)

## 2018-06-25 LAB — ABO/RH: ABO/RH(D): O POS

## 2018-06-25 LAB — HEMOGLOBIN AND HEMATOCRIT, BLOOD
HCT: 28.3 % — ABNORMAL LOW (ref 39.0–52.0)
HCT: 30.7 % — ABNORMAL LOW (ref 39.0–52.0)
HEMATOCRIT: 24.8 % — AB (ref 39.0–52.0)
HEMOGLOBIN: 7.7 g/dL — AB (ref 13.0–17.0)
Hemoglobin: 8.6 g/dL — ABNORMAL LOW (ref 13.0–17.0)
Hemoglobin: 9.6 g/dL — ABNORMAL LOW (ref 13.0–17.0)

## 2018-06-25 LAB — PROTIME-INR
INR: 1.03
Prothrombin Time: 13.5 seconds (ref 11.4–15.2)

## 2018-06-25 LAB — HIV ANTIBODY (ROUTINE TESTING W REFLEX): HIV SCREEN 4TH GENERATION: NONREACTIVE

## 2018-06-25 LAB — MAGNESIUM: MAGNESIUM: 2 mg/dL (ref 1.7–2.4)

## 2018-06-25 LAB — PREPARE RBC (CROSSMATCH)

## 2018-06-25 LAB — GLUCOSE, CAPILLARY
Glucose-Capillary: 113 mg/dL — ABNORMAL HIGH (ref 70–99)
Glucose-Capillary: 128 mg/dL — ABNORMAL HIGH (ref 70–99)

## 2018-06-25 MED ORDER — IOPAMIDOL (ISOVUE-370) INJECTION 76%
INTRAVENOUS | Status: AC
  Filled 2018-06-25: qty 100

## 2018-06-25 MED ORDER — SODIUM CHLORIDE 0.9% IV SOLUTION
Freq: Once | INTRAVENOUS | Status: AC
  Administered 2018-06-25: 15:00:00 via INTRAVENOUS

## 2018-06-25 MED ORDER — SODIUM CHLORIDE (PF) 0.9 % IJ SOLN
INTRAMUSCULAR | Status: AC
  Filled 2018-06-25: qty 50

## 2018-06-25 MED ORDER — INSULIN ASPART 100 UNIT/ML ~~LOC~~ SOLN
0.0000 [IU] | Freq: Three times a day (TID) | SUBCUTANEOUS | Status: DC
  Administered 2018-06-26 (×2): 1 [IU] via SUBCUTANEOUS

## 2018-06-25 MED ORDER — IOPAMIDOL (ISOVUE-370) INJECTION 76%
100.0000 mL | Freq: Once | INTRAVENOUS | Status: AC | PRN
  Administered 2018-06-25: 100 mL via INTRAVENOUS

## 2018-06-25 NOTE — Progress Notes (Signed)
Aware of possible embolization request due to patient's perinephric hematoma.  CTA abdomen/pelbvis ordered today to re-evaluate for active bleeding. Imaging reviewed by both Dr. Earleen Newport and Dr. Annamaria Boots who state there is no evidence of active bleeding and hematoma looks stable. Because of this, no indication for IR at this time, and recommend conservative measures including monitoring and transfusions. Dr. Horris Latino made aware.  IR available in future if needed.  Bea Graff Louk, PA-C 06/25/2018, 12:16 PM

## 2018-06-25 NOTE — Progress Notes (Signed)
PROGRESS NOTE  William Carson  RXV:[id number]  DOB: October 12, 1965  DOA: 06/23/2018 PCP: Terald Sleeper, PA-C   Brief Admission Hx: 52 y.o. male with medical history significant for adenomatous polyps, COPD, CAD, type 2 diabetes, diverticulosis, hemorrhoids, hyperlipidemia, history of influenza A, obesity presents to the emergency department with complaints of progressively worse sharp right flank pain for the last 3 days associated with malaise, decreased appetite, nausea, but denies emesis, diarrhea or constipation. Pt admitted with a right perinephric hematoma.     Assessment & Plan:   Right perinephric hematoma Patient continues to have R flank pain, requiring IV dilaudid Afebrile with resolving leukocytosis CT abdomen showing moderate to large R subcapsular perinephric hematoma with active arterial extravasation  Urology consulted: Rec bedrest, serial H&H, transfusion prn IR consulted: Rec CT angio abd/pelvis which showed: Redemonstration of right perinephric hematoma, with no enlargement from the prior CT. CT angiogram demonstrates no evidence of active extravasation. A follow-up contrast-enhanced CT or MR is recommended in 3 months-6 months to evaluate for a lesion as a potential source of hemorrhage. No need for IR embolization Continue ciprofloxacin Monitor closely  Normocytic anemia/acute blood loss anemia Likely due to above Hemoglobin dropping, although CTA abd/pelvis shows no active bleeding Transfuse 2U of PRBC Monitor cbc closely  CAD Currently chest pain free Aspirin currently being held  COPD Continue supplemental oxygen prn, Symbicort and bronchodilators.  Type 2 diabetes mellitus SSI, accuchecks Hold home oral hypoglycemics   DVT prophylaxis: SCDs Code Status: Full code Family Communication: Patient and wife updated over the phone Disposition Plan: TBD   Consultants:  [facility]  IR  Procedures:  None  Antimicrobials:  Ciprofloxacin  12/3   Subjective: Pt continues to have R flank pain, denies any other complaints  Objective: Vitals:   06/25/18 1153 06/25/18 1227 06/25/18 1418 06/25/18 1456  BP: 126/83 119/81 136/81 138/81  Pulse: 86 84 85 84  Resp: 20 20 (!) 21 (!) 21  Temp: 97.8 F (36.6 C) 99.1 F (37.3 C) 98 F (36.7 C) 98.1 F (36.7 C)  TempSrc: Oral Axillary Oral Oral  SpO2: 91% 91% 91% 91%  Weight:      Height:        Intake/Output Summary (Last 24 hours) at 06/25/2018 1619 Last data filed at 06/25/2018 0000 Gross per 24 hour  Intake 400 ml  Output -  Net 400 ml   Filed Weights   06/23/18 1855  Weight: 129.3 kg     REVIEW OF SYSTEMS  As per history otherwise all reviewed and reported negative  Exam:  General: NAD   Cardiovascular: S1, S2 present  Respiratory: CTAB  Abdomen: Soft, R flank tenderness, nondistended, bowel sounds present  Musculoskeletal: No bilateral pedal edema noted  Skin: Normal  Psychiatry: Normal mood   Data Reviewed: Basic Metabolic Panel: Recent Labs  Lab 06/23/18 1857 06/24/18 0716 06/25/18 0454  NA 134* 135 138  K 3.9 5.1 4.1  CL 107 108 106  CO2 19* 22 27  GLUCOSE 200* 190* 126*  BUN 13 17 13   CREATININE 0.94 0.98 0.71  CALCIUM 8.6* 7.9* 7.9*  MG 1.8  --  2.0  PHOS 3.8  --   --    Liver Function Tests: Recent Labs  Lab 06/23/18 1857 06/24/18 0716 06/25/18 0454  AST 35 24 20  ALT 67* 55* 42  ALKPHOS 39 32* 28*  BILITOT 0.5 0.4 0.5  PROT 6.9 6.3* 5.7*  ALBUMIN 4.1 3.6 3.4*   Recent Labs  Lab 06/23/18 1857  LIPASE 29   No results for input(s): AMMONIA in the last 168 hours. CBC: Recent Labs  Lab 06/23/18 1857  06/24/18 0716 06/24/18 1814 06/24/18 2355 06/25/18 0454 06/25/18 1200  WBC 18.6*  --  16.8*  --   --  12.8*  --   NEUTROABS  --   --  12.9*  --   --  9.0*  --   HGB 13.3   < > 10.9* 8.8* 8.6* 8.1* 7.7*  HCT 42.3   < > 35.1* 28.6* 28.3* 26.4* 24.8*  MCV 94.6  --  97.2  --   --  98.1  --   PLT 322  --  298  --    --  202  --    < > = values in this interval not displayed.   Cardiac Enzymes: No results for input(s): CKTOTAL, CKMB, CKMBINDEX, TROPONINI in the last 168 hours. CBG (last 3)  No results for input(s): GLUCAP in the last 72 hours. No results found for this or any previous visit (from the past 240 hour(s)).   Studies: Ct Abdomen Pelvis W Contrast  Result Date: 06/23/2018 CLINICAL DATA:  52 year old male with acute RIGHT abdominal and pelvic pain for 2 days. EXAM: CT ABDOMEN AND PELVIS WITH CONTRAST TECHNIQUE: Multidetector CT imaging of the abdomen and pelvis was performed using the standard protocol following bolus administration of intravenous contrast. CONTRAST:  136mL ISOVUE-300 IOPAMIDOL (ISOVUE-300) INJECTION 61% COMPARISON:  12/27/2016 CT FINDINGS: Lower chest: Mild bibasilar atelectasis noted. Hepatobiliary: Mild hepatic steatosis noted without focal hepatic abnormality. The gallbladder is unremarkable. No biliary dilatation. Pancreas: Unremarkable Spleen: Unremarkable Adrenals/Urinary Tract: Moderate to large RIGHT subcapsular perinephric hematoma with areas of posterior and inferior active arterial extravasation noted. A moderate to large amount of ill-defined hemorrhage extends into the RIGHT retroperitoneum. The source of this hemorrhage is not determined on this study. Only small cysts were present in the RIGHT kidney on the 12/27/2016 CT. No hydronephrosis. The LEFT kidney, adrenal glands and bladder are unremarkable except for small LEFT renal cysts. Stomach/Bowel: Stomach is within normal limits. Appendix appears normal. No evidence of bowel wall thickening, distention, or inflammatory changes. Vascular/Lymphatic: Aortic atherosclerosis. No enlarged abdominal or pelvic lymph nodes. Reproductive: Prostate is unremarkable. Other: No ascites or pneumoperitoneum. Musculoskeletal: No acute or suspicious bony abnormalities. IMPRESSION: 1. Moderate to large RIGHT subcapsular perinephric hematoma  with active arterial extravasation. Extension of moderate to large amount of ill-defined hemorrhage into the RIGHT retroperitoneum. The cause of this hemorrhage is not determined on this study. 2. Mild hepatic steatosis 3.  Aortic Atherosclerosis (ICD10-I70.0). Critical Value/emergent results were called by telephone at the time of interpretation on 06/23/2018 at 9:40 pm to Dr. Vanita Panda , who verbally acknowledged these results. Electronically Signed   By: Margarette Canada M.D.   On: 06/23/2018 21:43   Ct Angio Abd/pel W/ And/or W/o  Result Date: 06/25/2018 CLINICAL DATA:  52 year old male with a history of right-sided perinephric hematoma EXAM: CTA ABDOMEN AND PELVIS wITHOUT AND WITH CONTRAST TECHNIQUE: Multidetector CT imaging of the abdomen and pelvis was performed using the standard protocol during bolus administration of intravenous contrast. Multiplanar reconstructed images and MIPs were obtained and reviewed to evaluate the vascular anatomy. CONTRAST:  161mL ISOVUE-370 IOPAMIDOL (ISOVUE-370) INJECTION 76% COMPARISON:  CT 06/23/2018, 12/27/2016 FINDINGS: VASCULAR Aorta: Mild atherosclerotic changes of the abdominal aorta no aneurysm. No dissection. Celiac: No significant atherosclerosis of the celiac artery. SMA: No significant atherosclerotic changes of the superior mesenteric artery. Renals:  Mild atherosclerotic changes of the bilateral renal arteries without significant stenosis. IMA: IMA patent Right lower extremity: Atherosclerotic changes of the iliac system. Hypogastric artery patent. No aneurysm or dissection. No occlusion. Proximal femoral arteries patent. Left lower extremity: Atherosclerotic changes of the left iliac system. No aneurysm or dissection. Hypogastric artery patent. Proximal femoral arteries patent. Veins: Unremarkable appearance of the venous system. Review of the MIP images confirms the above findings. NON-VASCULAR Lower chest: Differential attenuation of the blood pool and the  myocardium. Calcifications of the native coronary arteries. Right pleural effusion with associated atelectasis. Hepatobiliary: Diffusely decreased attenuation of liver parenchyma. Hyperdense material within the gallbladder lumen. Pancreas: Unremarkable pancreas Spleen: Unremarkable spleen Adrenals/Urinary Tract: Unremarkable adrenal glands. Right: Redemonstration of perinephric hematoma of the right, with scalloping of the cortex at the lateral cortex and inferior medial cortex. The size of the perinephric hematoma is unchanged, on axial images 12 cm, compared to the prior of 12 cm. There is some decompression of hematoma inferiorly through the fascial planes into the pelvis, with overall decreased volume from the comparison. There also appears to be decompression superiorly, overlying the liver margin in the subdiaphragmatic region. The perfusion of the right kidney is maintained compared to the left on the arterial phase and delayed phase. The resolution of the CT for evaluation of underlying lesions is reduced given the distortion of the kidney parenchyma and the presence of the blood products. Hyperdense focus at the inferior medial cortex measures 12 mm on image 46 of series 2. Hyperdense focus at the superior lateral cortex on image 32 of series 2. Both of these are present on the precontrast series and do not enhance on the post. No right-sided hydronephrosis. Left: Left kidney relatively unremarkable with low-density lesion on the lateral cortex, too small to characterize. No hydronephrosis. Unremarkable appearance of the urinary bladder . Stomach/Bowel: Unremarkable appearance of the stomach. Unremarkable appearance of small bowel. No evidence of obstruction. Colonic diverticula. No associated inflammatory changes. Normal appendix. Lymphatic: No lymphadenopathy. Mesenteric: No free fluid or air. No adenopathy. Reproductive: Unremarkable appearance of the pelvic organs. Other: Small fat containing umbilical  hernia. Musculoskeletal: No acute displaced fracture. IMPRESSION: Redemonstration of right perinephric hematoma, with no enlargement from the prior CT. Scalloping of the kidney cortex secondary to compression and subcapsular component, somewhat distorts the kidney parenchyma and limits the evaluation of the kidney for any underlying lesions. A follow-up contrast-enhanced CT or MR is recommended in 3 months-6 months to evaluate for a lesion as a potential source of hemorrhage. The current CT angiogram demonstrates no evidence of active extravasation. CT demonstrates evidence of anemia. Liver steatosis. Aortic Atherosclerosis (ICD10-I70.0). Signed, Dulcy Fanny. Dellia Nims, RPVI [facility] Electronically Signed   By: Corrie Mckusick D.O.   On: 06/25/2018 12:22   Scheduled Meds: . Influenza vac split quadrivalent PF  0.5 mL Intramuscular Tomorrow-1000  . iopamidol      . mometasone-formoterol  2 puff Inhalation BID  . sodium chloride (PF)      . venlafaxine XR  150 mg Oral QHS   Continuous Infusions: . sodium chloride 100 mL/hr at 06/25/18 0907  . ciprofloxacin 400 mg (06/25/18 1027)    Principal Problem:   Perinephric hematoma Active Problems:   Hyperlipidemia   CAD S/P percutaneous coronary angioplasty   COPD (chronic obstructive pulmonary disease) (HCC)   T2DM (type 2 diabetes mellitus) ([place])  Time spent:   Alma Friendly, MD [facility]   If 7PM-7AM, please  contact night-coverage 06/25/2018, 4:19 PM    LOS: 2 days

## 2018-06-26 LAB — GLUCOSE, CAPILLARY
GLUCOSE-CAPILLARY: 132 mg/dL — AB (ref 70–99)
Glucose-Capillary: 140 mg/dL — ABNORMAL HIGH (ref 70–99)
Glucose-Capillary: 113 mg/dL — ABNORMAL HIGH (ref 70–99)
Glucose-Capillary: 107 mg/dL — ABNORMAL HIGH (ref 70–99)

## 2018-06-26 LAB — URINALYSIS, ROUTINE W REFLEX MICROSCOPIC
Bilirubin Urine: NEGATIVE
Glucose, UA: NEGATIVE mg/dL
HGB URINE DIPSTICK: NEGATIVE
Ketones, ur: NEGATIVE mg/dL
Leukocytes, UA: NEGATIVE
Nitrite: NEGATIVE
Protein, ur: NEGATIVE mg/dL
Specific Gravity, Urine: 1.025 (ref 1.005–1.030)
pH: 6 (ref 5.0–8.0)

## 2018-06-26 LAB — CBC WITH DIFFERENTIAL/PLATELET
Abs Immature Granulocytes: 0.11 10*3/uL — ABNORMAL HIGH (ref 0.00–0.07)
Basophils Absolute: 0.1 10*3/uL (ref 0.0–0.1)
Basophils Relative: 0 %
Eosinophils Absolute: 0.1 10*3/uL (ref 0.0–0.5)
Eosinophils Relative: 1 %
HCT: 28.9 % — ABNORMAL LOW (ref 39.0–52.0)
Hemoglobin: 9 g/dL — ABNORMAL LOW (ref 13.0–17.0)
Immature Granulocytes: 1 %
Lymphocytes Relative: 8 %
Lymphs Abs: 1 10*3/uL (ref 0.7–4.0)
MCH: 30 pg (ref 26.0–34.0)
MCHC: 31.1 g/dL (ref 30.0–36.0)
MCV: 96.3 fL (ref 80.0–100.0)
MONO ABS: 2.2 10*3/uL — AB (ref 0.1–1.0)
Monocytes Relative: 17 %
NEUTROS ABS: 9.5 10*3/uL — AB (ref 1.7–7.7)
Neutrophils Relative %: 73 %
Platelets: 186 10*3/uL (ref 150–400)
RBC: 3 MIL/uL — AB (ref 4.22–5.81)
RDW: 16.6 % — ABNORMAL HIGH (ref 11.5–15.5)
WBC: 12.9 10*3/uL — AB (ref 4.0–10.5)
nRBC: 0.2 % (ref 0.0–0.2)

## 2018-06-26 LAB — TYPE AND SCREEN
ABO/RH(D): O POS
ANTIBODY SCREEN: NEGATIVE
Unit division: 0
Unit division: 0

## 2018-06-26 LAB — BPAM RBC
BLOOD PRODUCT EXPIRATION DATE: 201912312359
Blood Product Expiration Date: 201912312359
ISSUE DATE / TIME: 201912051203
ISSUE DATE / TIME: 201912051431
Unit Type and Rh: 5100
Unit Type and Rh: 5100

## 2018-06-26 MED ORDER — SENNOSIDES-DOCUSATE SODIUM 8.6-50 MG PO TABS
1.0000 | ORAL_TABLET | Freq: Two times a day (BID) | ORAL | Status: DC
  Administered 2018-06-26 – 2018-06-27 (×3): 1 via ORAL
  Filled 2018-06-26 (×3): qty 1

## 2018-06-26 MED ORDER — POLYETHYLENE GLYCOL 3350 17 G PO PACK
17.0000 g | PACK | Freq: Two times a day (BID) | ORAL | Status: DC
  Administered 2018-06-26 – 2018-06-27 (×3): 17 g via ORAL
  Filled 2018-06-26 (×3): qty 1

## 2018-06-26 MED ORDER — ACETAMINOPHEN 500 MG PO TABS
1000.0000 mg | ORAL_TABLET | Freq: Four times a day (QID) | ORAL | Status: DC | PRN
  Administered 2018-06-26: 1000 mg via ORAL
  Filled 2018-06-26 (×2): qty 2

## 2018-06-26 MED ORDER — OXYCODONE-ACETAMINOPHEN 5-325 MG PO TABS
1.0000 | ORAL_TABLET | Freq: Four times a day (QID) | ORAL | Status: DC | PRN
  Administered 2018-06-26: 2 via ORAL
  Filled 2018-06-26: qty 2

## 2018-06-26 MED ORDER — HYDROMORPHONE HCL 1 MG/ML IJ SOLN
2.0000 mg | INTRAMUSCULAR | Status: DC | PRN
  Administered 2018-06-26 – 2018-06-27 (×2): 2 mg via INTRAVENOUS
  Filled 2018-06-26 (×3): qty 2

## 2018-06-26 MED ORDER — OXYCODONE HCL 5 MG PO TABS
10.0000 mg | ORAL_TABLET | ORAL | Status: DC | PRN
  Administered 2018-06-26 – 2018-06-27 (×5): 10 mg via ORAL
  Filled 2018-06-26 (×5): qty 2

## 2018-06-26 MED ORDER — HYDROMORPHONE HCL 1 MG/ML IJ SOLN: 1.0000 mg | INTRAMUSCULAR | Status: DC | PRN

## 2018-06-26 NOTE — Progress Notes (Signed)
PROGRESS NOTE  William Carson  HAL:[id number]  DOB: 1966-04-05  DOA: 06/23/2018 PCP: Terald Sleeper, PA-C   Brief Admission Hx: 52 y.o. male with medical history significant for adenomatous polyps, COPD, CAD, type 2 diabetes, diverticulosis, hemorrhoids, hyperlipidemia, history of influenza A, obesity presents to the emergency department with complaints of progressively worse sharp right flank pain for the last 3 days associated with malaise, decreased appetite, nausea, but denies emesis, diarrhea or constipation. Pt admitted with a right perinephric hematoma.     Assessment & Plan:   Right perinephric hematoma Patient continues to have R flank pain, requiring IV dilaudid Afebrile with resolving leukocytosis CT abdomen showing moderate to large R subcapsular perinephric hematoma with active arterial extravasation  Urology consulted: Rec bedrest, serial H&H, transfusion prn. Spoke to Urologist Dr Louis Meckel on 06/26/18, as no formal consult note has been done by an attending. Stated someone from their dept will re-evaluate pt IR consulted: Rec CT angio abd/pelvis which showed: Redemonstration of right perinephric hematoma, with no enlargement from the prior CT. CT angiogram demonstrates no evidence of active extravasation. A follow-up contrast-enhanced CT or MR is recommended in 3 months-6 months to evaluate for a lesion as a potential source of hemorrhage. No need for IR embolization Continue ciprofloxacin, pain management. Plans to transition to oral pain meds for possible d/c soon Monitor closely  Normocytic anemia/acute blood loss anemia Likely due to above Hemoglobin dropping, although CTA abd/pelvis shows no active bleeding Transfused 2U of PRBC on 06/25/18 Monitor cbc closely  CAD Currently chest pain free Aspirin currently being held  COPD Continue supplemental oxygen prn, Symbicort and bronchodilators.  Type 2 diabetes mellitus SSI, accuchecks Hold home oral  hypoglycemics  Obesity Advised lifestyle modification   DVT prophylaxis: SCDs Code Status: Full code Family Communication: None at bedside Disposition Plan: TBD   Consultants:  [facility]  IR  Procedures:  None  Antimicrobials:  Ciprofloxacin 12/3   Subjective: Pt continues to have R flank pain. Denies any other complaints. Spoke to Urology, they will see patient.  Objective: Vitals:   06/26/18 0502 06/26/18 0853 06/26/18 0855 06/26/18 1418  BP: 138/82   135/83  Pulse: 87   85  Resp: 17   18  Temp: 98.2 F (36.8 C)   98.1 F (36.7 C)  TempSrc: Oral   Oral  SpO2: 90% 93% 93% 96%  Weight:      Height:        Intake/Output Summary (Last 24 hours) at 06/26/2018 1503 Last data filed at 06/26/2018 0600 Gross per 24 hour  Intake 3522.04 ml  Output -  Net 3522.04 ml   Filed Weights   06/23/18 1855  Weight: 129.3 kg     REVIEW OF SYSTEMS  As per history otherwise all reviewed and reported negative  Exam:  General: NAD   Cardiovascular: S1, S2 present  Respiratory: CTAB  Abdomen: Soft, R flank tenderness, nondistended, bowel sounds present  Musculoskeletal: No bilateral pedal edema noted  Skin: Normal  Psychiatry: Normal mood   Data Reviewed: Basic Metabolic Panel: Recent Labs  Lab 06/23/18 1857 06/24/18 0716 06/25/18 0454  NA 134* 135 138  K 3.9 5.1 4.1  CL 107 108 106  CO2 19* 22 27  GLUCOSE 200* 190* 126*  BUN 13 17 13   CREATININE 0.94 0.98 0.71  CALCIUM 8.6* 7.9* 7.9*  MG 1.8  --  2.0  PHOS 3.8  --   --    Liver Function Tests: Recent Labs  Lab  06/23/18 1857 06/24/18 0716 06/25/18 0454  AST 35 24 20  ALT 67* 55* 42  ALKPHOS 39 32* 28*  BILITOT 0.5 0.4 0.5  PROT 6.9 6.3* 5.7*  ALBUMIN 4.1 3.6 3.4*   Recent Labs  Lab 06/23/18 1857  LIPASE 29   No results for input(s): AMMONIA in the last 168 hours. CBC: Recent Labs  Lab 06/23/18 1857  06/24/18 0716  06/24/18 2355 06/25/18 0454 06/25/18 1200  06/25/18 1718 06/26/18 0537  WBC 18.6*  --  16.8*  --   --  12.8*  --   --  12.9*  NEUTROABS  --   --  12.9*  --   --  9.0*  --   --  9.5*  HGB 13.3   < > 10.9*   < > 8.6* 8.1* 7.7* 9.6* 9.0*  HCT 42.3   < > 35.1*   < > 28.3* 26.4* 24.8* 30.7* 28.9*  MCV 94.6  --  97.2  --   --  98.1  --   --  96.3  PLT 322  --  298  --   --  202  --   --  186   < > = values in this interval not displayed.   Cardiac Enzymes: No results for input(s): CKTOTAL, CKMB, CKMBINDEX, TROPONINI in the last 168 hours. CBG (last 3)  Recent Labs    06/25/18 1704 06/25/18 2136 06/26/18 0818  GLUCAP 113* 128* 132*   No results found for this or any previous visit (from the past 240 hour(s)).   Studies: Ct Angio Abd/pel W/ And/or W/o  Result Date: 06/25/2018 CLINICAL DATA:  52 year old male with a history of right-sided perinephric hematoma EXAM: CTA ABDOMEN AND PELVIS wITHOUT AND WITH CONTRAST TECHNIQUE: Multidetector CT imaging of the abdomen and pelvis was performed using the standard protocol during bolus administration of intravenous contrast. Multiplanar reconstructed images and MIPs were obtained and reviewed to evaluate the vascular anatomy. CONTRAST:  138mL ISOVUE-370 IOPAMIDOL (ISOVUE-370) INJECTION 76% COMPARISON:  CT 06/23/2018, 12/27/2016 FINDINGS: VASCULAR Aorta: Mild atherosclerotic changes of the abdominal aorta no aneurysm. No dissection. Celiac: No significant atherosclerosis of the celiac artery. SMA: No significant atherosclerotic changes of the superior mesenteric artery. Renals: Mild atherosclerotic changes of the bilateral renal arteries without significant stenosis. IMA: IMA patent Right lower extremity: Atherosclerotic changes of the iliac system. Hypogastric artery patent. No aneurysm or dissection. No occlusion. Proximal femoral arteries patent. Left lower extremity: Atherosclerotic changes of the left iliac system. No aneurysm or dissection. Hypogastric artery patent. Proximal femoral arteries  patent. Veins: Unremarkable appearance of the venous system. Review of the MIP images confirms the above findings. NON-VASCULAR Lower chest: Differential attenuation of the blood pool and the myocardium. Calcifications of the native coronary arteries. Right pleural effusion with associated atelectasis. Hepatobiliary: Diffusely decreased attenuation of liver parenchyma. Hyperdense material within the gallbladder lumen. Pancreas: Unremarkable pancreas Spleen: Unremarkable spleen Adrenals/Urinary Tract: Unremarkable adrenal glands. Right: Redemonstration of perinephric hematoma of the right, with scalloping of the cortex at the lateral cortex and inferior medial cortex. The size of the perinephric hematoma is unchanged, on axial images 12 cm, compared to the prior of 12 cm. There is some decompression of hematoma inferiorly through the fascial planes into the pelvis, with overall decreased volume from the comparison. There also appears to be decompression superiorly, overlying the liver margin in the subdiaphragmatic region. The perfusion of the right kidney is maintained compared to the left on the arterial phase and delayed phase. The resolution  of the CT for evaluation of underlying lesions is reduced given the distortion of the kidney parenchyma and the presence of the blood products. Hyperdense focus at the inferior medial cortex measures 12 mm on image 46 of series 2. Hyperdense focus at the superior lateral cortex on image 32 of series 2. Both of these are present on the precontrast series and do not enhance on the post. No right-sided hydronephrosis. Left: Left kidney relatively unremarkable with low-density lesion on the lateral cortex, too small to characterize. No hydronephrosis. Unremarkable appearance of the urinary bladder . Stomach/Bowel: Unremarkable appearance of the stomach. Unremarkable appearance of small bowel. No evidence of obstruction. Colonic diverticula. No associated inflammatory changes.  Normal appendix. Lymphatic: No lymphadenopathy. Mesenteric: No free fluid or air. No adenopathy. Reproductive: Unremarkable appearance of the pelvic organs. Other: Small fat containing umbilical hernia. Musculoskeletal: No acute displaced fracture. IMPRESSION: Redemonstration of right perinephric hematoma, with no enlargement from the prior CT. Scalloping of the kidney cortex secondary to compression and subcapsular component, somewhat distorts the kidney parenchyma and limits the evaluation of the kidney for any underlying lesions. A follow-up contrast-enhanced CT or MR is recommended in 3 months-6 months to evaluate for a lesion as a potential source of hemorrhage. The current CT angiogram demonstrates no evidence of active extravasation. CT demonstrates evidence of anemia. Liver steatosis. Aortic Atherosclerosis (ICD10-I70.0). Signed, Dulcy Fanny. Dellia Nims, RPVI [facility] Electronically Signed   By: Corrie Mckusick D.O.   On: 06/25/2018 12:22   Scheduled Meds: . insulin aspart  0-9 Units Subcutaneous TID WC  . mometasone-formoterol  2 puff Inhalation BID  . polyethylene glycol  17 g Oral BID  . senna-docusate  1 tablet Oral BID  . venlafaxine XR  150 mg Oral QHS   Continuous Infusions: . ciprofloxacin 400 mg (06/26/18 0902)    Principal Problem:   Perinephric hematoma Active Problems:   Hyperlipidemia   CAD S/P percutaneous coronary angioplasty   COPD (chronic obstructive pulmonary disease) (HCC)   T2DM (type 2 diabetes mellitus) ([place])  Time spent:   Alma Friendly, MD [facility]   If 7PM-7AM, please contact night-coverage 06/26/2018, 3:03 PM    LOS: 3 days

## 2018-06-27 LAB — CBC WITH DIFFERENTIAL/PLATELET
Abs Immature Granulocytes: 0.13 10*3/uL — ABNORMAL HIGH (ref 0.00–0.07)
Basophils Absolute: 0.1 10*3/uL (ref 0.0–0.1)
Basophils Relative: 1 %
EOS PCT: 1 %
Eosinophils Absolute: 0.1 10*3/uL (ref 0.0–0.5)
HCT: 30.1 % — ABNORMAL LOW (ref 39.0–52.0)
HEMOGLOBIN: 9.3 g/dL — AB (ref 13.0–17.0)
Immature Granulocytes: 1 %
Lymphocytes Relative: 10 %
Lymphs Abs: 1.4 10*3/uL (ref 0.7–4.0)
MCH: 30 pg (ref 26.0–34.0)
MCHC: 30.9 g/dL (ref 30.0–36.0)
MCV: 97.1 fL (ref 80.0–100.0)
Monocytes Absolute: 2.2 10*3/uL — ABNORMAL HIGH (ref 0.1–1.0)
Monocytes Relative: 17 %
Neutro Abs: 9.4 10*3/uL — ABNORMAL HIGH (ref 1.7–7.7)
Neutrophils Relative %: 70 %
Platelets: 209 10*3/uL (ref 150–400)
RBC: 3.1 MIL/uL — ABNORMAL LOW (ref 4.22–5.81)
RDW: 16.3 % — ABNORMAL HIGH (ref 11.5–15.5)
WBC: 13.2 10*3/uL — ABNORMAL HIGH (ref 4.0–10.5)
nRBC: 0.3 % — ABNORMAL HIGH (ref 0.0–0.2)

## 2018-06-27 LAB — GLUCOSE, CAPILLARY
Glucose-Capillary: 111 mg/dL — ABNORMAL HIGH (ref 70–99)
Glucose-Capillary: 95 mg/dL (ref 70–99)

## 2018-06-27 MED ORDER — SENNOSIDES-DOCUSATE SODIUM 8.6-50 MG PO TABS: 1.0000 | ORAL_TABLET | Freq: Two times a day (BID) | ORAL | 0 refills | Status: AC

## 2018-06-27 MED ORDER — OXYCODONE HCL 10 MG PO TABS: 10.0000 mg | ORAL_TABLET | Freq: Four times a day (QID) | ORAL | 0 refills | Status: DC | PRN

## 2018-06-27 MED ORDER — CIPROFLOXACIN HCL 500 MG PO TABS: 500.0000 mg | ORAL_TABLET | Freq: Two times a day (BID) | ORAL | 0 refills | Status: AC

## 2018-06-27 MED ORDER — CIPROFLOXACIN HCL 500 MG PO TABS: 500.0000 mg | ORAL_TABLET | Freq: Two times a day (BID) | ORAL | Status: DC

## 2018-06-27 MED ORDER — POLYETHYLENE GLYCOL 3350 17 G PO PACK: 17.0000 g | PACK | Freq: Every day | ORAL | 0 refills | Status: DC

## 2018-06-27 MED ORDER — ACETAMINOPHEN 500 MG PO TABS: 1000.0000 mg | ORAL_TABLET | Freq: Four times a day (QID) | ORAL | 0 refills | Status: DC | PRN

## 2018-06-27 MED ORDER — HYDROMORPHONE HCL 1 MG/ML IJ SOLN: 2.0000 mg | Freq: Three times a day (TID) | INTRAMUSCULAR | Status: DC | PRN

## 2018-06-27 NOTE — Progress Notes (Signed)
  Patient Saturations on Room Air at Rest = 96%  Patient Saturations on [employer] while Ambulating = 94%

## 2018-06-27 NOTE — Progress Notes (Signed)
Reviewed discharge information with patient and wife. Answered all questions. Patient/caregiver able to teach back medications and reasons to contact MD/911. Patient verbalizes importance of PCP follow up appointment.  William Carson. Brigitte Pulse, RN

## 2018-06-27 NOTE — Progress Notes (Signed)
PHARMACIST - PHYSICIAN COMMUNICATION DR:   Horris Latino CONCERNING: Antibiotic IV to Oral Route Change Policy  RECOMMENDATION: This patient is receiving cipro by the intravenous route.  Based on criteria approved by the Pharmacy and Therapeutics Committee, the antibiotic(s) is/are being converted to the equivalent oral dose form(s).   DESCRIPTION: These criteria include:  Patient being treated for a respiratory tract infection, urinary tract infection, cellulitis or clostridium difficile associated diarrhea if on metronidazole  The patient is not neutropenic and does not exhibit a GI malabsorption state  The patient is eating (either orally or via tube) and/or has been taking other orally administered medications for a least 24 hours  The patient is improving clinically and has a Tmax < 100.5  If you have questions about this conversion, please contact the Pharmacy Department  []   [phone] )  [facility] []   [phone] )  [facility] []   [phone] )  [facility] []   [phone] )  [facility] [x]   [phone] )  [place], PharmD, BCPS 06/27/2018 11:58 AM

## 2018-06-27 NOTE — Discharge Summary (Signed)
Discharge Summary  William Carson AOZ:[id number] DOB: 1966-04-17  PCP: Terald Sleeper, PA-C  Admit date: 06/23/2018 Discharge date: 06/27/2018  Time spent: 35 mins  Recommendations for Outpatient Follow-up:  1. PCP in 1 week with repeat labs 2. Urology in 6 weeks for follow up   Discharge Diagnoses:  Active Hospital Problems   Diagnosis Date Noted  . Perinephric hematoma 06/23/2018  . T2DM (type 2 diabetes mellitus) ([place]) 05/13/2016  . CAD S/P percutaneous coronary angioplasty 07/18/2010  . COPD (chronic obstructive pulmonary disease) ([place]) 07/18/2010  . Hyperlipidemia 07/18/2010    Resolved Hospital Problems  No resolved problems to display.    Discharge Condition: Stable  Diet recommendation: Heart healthy/mod carb  Vitals:   06/27/18 0508 06/27/18 0920  BP: 127/78   Pulse: 95   Resp: 20   Temp: 99.3 F (37.4 C)   SpO2: 93% 94%    History of present illness:  52 y.o.malewith medical history significant for adenomatous polyps, COPD, CAD, type 2 diabetes, diverticulosis, hemorrhoids, hyperlipidemia, history of influenza A, obesity presents to the emergency department with complaints ofprogressively worse sharp right flank painfor the last 3 days associated with malaise, decreased appetite, nausea, but denies emesis, diarrhea or constipation. Pt admitted with a right perinephric hematoma.     Today, pt reported feeling better, denies any worsening R flank pain. Denies any chest pain, SOB, N/V, obvious hematuria, fever/chills, dizziness. Pt stable to be d/c from urology and my standpoint. Pt advised to follow up with PCP and Urology. Light duty advised, no heavy lifting.  Hospital Course:  Principal Problem:   Perinephric hematoma Active Problems:   Hyperlipidemia   CAD S/P percutaneous coronary angioplasty   COPD (chronic obstructive pulmonary disease) ([facility])   T2DM (type 2 diabetes mellitus) ([place])   Right perinephric hematoma Afebrile with leukocytosis CT  abdomen showing moderate to large R subcapsular perinephric hematoma with active arterial extravasation  Urology consulted: Light duty, follow up with repeat CT scan in 3 months. Pain management IR consulted: Rec CT angio abd/pelvis which showed: Redemonstration of right perinephric hematoma, with no enlargement from the prior CT. CT angiogram demonstrates no evidence of active extravasation. A follow-up contrast-enhanced CT or MR is recommended in 3 months-6 months to evaluate for a lesion as a potential source of hemorrhage. No need for IR embolization Continue ciprofloxacin for a total of 5 days for prophylaxis Pain management with oxycodone, stool softners Follow up with PCP with repeat labs and urinalysis  Normocytic anemia/acute blood loss anemia Likely due to above CTA abd/pelvis shows no active bleeding Transfused 2U of PRBC on 06/25/18 PCP to monitor hgb closely  CAD Currently chest pain free Aspirin currently being held, till ok to resume by PCP/Urology  COPD Continue supplemental oxygen prn, Symbicort and bronchodilators.  Type 2 diabetes mellitus Continue home oral hypoglycemics  Obesity Advised lifestyle modification         Malnutrition Type:      Malnutrition Characteristics:      Nutrition Interventions:      Estimated body mass index is 35.62 kg/m as calculated from the following:   Height as of this encounter: 6\' 3"  (1.905 m).   Weight as of this encounter: 129.3 kg.    Procedures:  None  Consultations:  Urology  IR  Discharge Exam: BP 127/78 (BP Location: Left Arm)   Pulse 95   Temp 99.3 F (37.4 C) (Oral)   Resp 20   Ht 6\' 3"  (1.905 m)   Wt 129.3  kg   SpO2 94%   BMI 35.62 kg/m   General: NAD Cardiovascular: S1, S2 present Respiratory: CTAB  Discharge Instructions You were cared for by a hospitalist during your hospital stay. If you have any questions about your discharge medications or the care you received while  you were in the hospital after you are discharged, you can call the unit and asked to speak with the hospitalist on call if the hospitalist that took care of you is not available. Once you are discharged, your primary care physician will handle any further medical issues. Please note that NO REFILLS for any discharge medications will be authorized once you are discharged, as it is imperative that you return to your primary care physician (or establish a relationship with a primary care physician if you do not have one) for your aftercare needs so that they can reassess your need for medications and monitor your lab values.   Allergies as of 06/27/2018      Reactions   Crestor [rosuvastatin Calcium] Other (See Comments)   myalgias   Depakote [divalproex Sodium]    Stomach pains   Lipitor [atorvastatin] Other (See Comments)   Myalgias   Meclizine    Increased dizziness   Other Swelling   Mammelain meat allergy   Simvastatin Other (See Comments)   Myalgias   Topamax [topiramate]    Back pain, tinnitus   Zonegran [zonisamide]    Increased headache      Medication List    STOP taking these medications   aspirin 81 MG EC tablet   oxyCODONE-acetaminophen 5-325 MG tablet Commonly known as:  PERCOCET/ROXICET     TAKE these medications   acetaminophen 500 MG tablet Commonly known as:  TYLENOL Take 2 tablets (1,000 mg total) by mouth every 6 (six) hours as needed for mild pain.   albuterol (2.5 MG/3ML) 0.083% nebulizer solution Commonly known as:  PROVENTIL Take 3 mLs (2.5 mg total) by nebulization every 4 (four) hours as needed for wheezing or shortness of breath.   albuterol 108 (90 Base) MCG/ACT inhaler Commonly known as:  PROVENTIL HFA;VENTOLIN HFA Inhale 1-2 puffs into the lungs every 6 (six) hours as needed for wheezing or shortness of breath.   amLODipine 5 MG tablet Commonly known as:  NORVASC Take 1 tablet (5 mg total) by mouth daily.   budesonide-formoterol 160-4.5  MCG/ACT inhaler Commonly known as:  SYMBICORT Inhale 2 puffs into the lungs 2 (two) times daily. What changed:    how much to take  how to take this  when to take this  additional instructions   ciprofloxacin 500 MG tablet Commonly known as:  CIPRO Take 1 tablet (500 mg total) by mouth 2 (two) times daily for 3 doses.   glucose blood test strip Use to check BG up to once daily   LIVALO 4 MG Tabs Generic drug:  Pitavastatin Calcium TAKE 1 TABLET DAILY What changed:    how much to take  when to take this   nitroGLYCERIN 0.4 MG SL tablet Commonly known as:  NITROSTAT Place 1 tablet (0.4 mg total) under the tongue every 5 (five) minutes as needed for chest pain. May repeat up to 3 doses.   ONETOUCH DELICA LANCETS 69C Misc Use to check BG up to once daily   Oxycodone HCl 10 MG Tabs Take 1 tablet (10 mg total) by mouth every 6 (six) hours as needed for up to 7 days for severe pain.   polyethylene glycol packet Commonly known  as:  MIRALAX / GLYCOLAX Take 17 g by mouth daily.   senna-docusate 8.6-50 MG tablet Commonly known as:  Senokot-S Take 1 tablet by mouth 2 (two) times daily.   SitaGLIPtin-MetFORMIN HCl [id number] MG Tb24 Take 1 tablet by mouth daily.   venlafaxine XR 150 MG 24 hr capsule Commonly known as:  EFFEXOR-XR Take 1 capsule (150 mg total) by mouth daily with breakfast. What changed:  when to take this      Allergies  Allergen Reactions  . Crestor [Rosuvastatin Calcium] Other (See Comments)    myalgias  . Depakote [Divalproex Sodium]     Stomach pains  . Lipitor [Atorvastatin] Other (See Comments)    Myalgias   . Meclizine     Increased dizziness  . Other Swelling    Mammelain meat allergy  . Simvastatin Other (See Comments)    Myalgias   . Topamax [Topiramate]     Back pain, tinnitus  . Zonegran [Zonisamide]     Increased headache   Follow-up Information    Terald Sleeper, PA-C. Schedule an appointment as soon as possible for a  visit in 1 week(s).   Specialties:  Physician Assistant, Family Medicine Contact information: [place] [address] [phone]        Ardis Hughs, MD. Schedule an appointment as soon as possible for a visit in 6 week(s).   Specialty:  Urology Contact information: [place] [address] [phone]            The results of significant diagnostics from this hospitalization (including imaging, microbiology, ancillary and laboratory) are listed below for reference.    Significant Diagnostic Studies: Ct Abdomen Pelvis W Contrast  Result Date: 06/23/2018 CLINICAL DATA:  52 year old male with acute RIGHT abdominal and pelvic pain for 2 days. EXAM: CT ABDOMEN AND PELVIS WITH CONTRAST TECHNIQUE: Multidetector CT imaging of the abdomen and pelvis was performed using the standard protocol following bolus administration of intravenous contrast. CONTRAST:  168mL ISOVUE-300 IOPAMIDOL (ISOVUE-300) INJECTION 61% COMPARISON:  12/27/2016 CT FINDINGS: Lower chest: Mild bibasilar atelectasis noted. Hepatobiliary: Mild hepatic steatosis noted without focal hepatic abnormality. The gallbladder is unremarkable. No biliary dilatation. Pancreas: Unremarkable Spleen: Unremarkable Adrenals/Urinary Tract: Moderate to large RIGHT subcapsular perinephric hematoma with areas of posterior and inferior active arterial extravasation noted. A moderate to large amount of ill-defined hemorrhage extends into the RIGHT retroperitoneum. The source of this hemorrhage is not determined on this study. Only small cysts were present in the RIGHT kidney on the 12/27/2016 CT. No hydronephrosis. The LEFT kidney, adrenal glands and bladder are unremarkable except for small LEFT renal cysts. Stomach/Bowel: Stomach is within normal limits. Appendix appears normal. No evidence of bowel wall thickening, distention, or inflammatory changes. Vascular/Lymphatic: Aortic atherosclerosis. No enlarged abdominal  or pelvic lymph nodes. Reproductive: Prostate is unremarkable. Other: No ascites or pneumoperitoneum. Musculoskeletal: No acute or suspicious bony abnormalities. IMPRESSION: 1. Moderate to large RIGHT subcapsular perinephric hematoma with active arterial extravasation. Extension of moderate to large amount of ill-defined hemorrhage into the RIGHT retroperitoneum. The cause of this hemorrhage is not determined on this study. 2. Mild hepatic steatosis 3.  Aortic Atherosclerosis (ICD10-I70.0). Critical Value/emergent results were called by telephone at the time of interpretation on 06/23/2018 at 9:40 pm to Dr. Vanita Panda , who verbally acknowledged these results. Electronically Signed   By: Margarette Canada M.D.   On: 06/23/2018 21:43   Ct Angio Abd/pel W/ And/or W/o  Result Date: 06/25/2018 CLINICAL DATA:  52 year old male with a history of right-sided perinephric hematoma EXAM: CTA ABDOMEN AND PELVIS wITHOUT AND WITH CONTRAST TECHNIQUE: Multidetector CT imaging of the abdomen and pelvis was performed using the standard protocol during bolus administration of intravenous contrast. Multiplanar reconstructed images and MIPs were obtained and reviewed to evaluate the vascular anatomy. CONTRAST:  176mL ISOVUE-370 IOPAMIDOL (ISOVUE-370) INJECTION 76% COMPARISON:  CT 06/23/2018, 12/27/2016 FINDINGS: VASCULAR Aorta: Mild atherosclerotic changes of the abdominal aorta no aneurysm. No dissection. Celiac: No significant atherosclerosis of the celiac artery. SMA: No significant atherosclerotic changes of the superior mesenteric artery. Renals: Mild atherosclerotic changes of the bilateral renal arteries without significant stenosis. IMA: IMA patent Right lower extremity: Atherosclerotic changes of the iliac system. Hypogastric artery patent. No aneurysm or dissection. No occlusion. Proximal femoral arteries patent. Left lower extremity: Atherosclerotic changes of the left iliac system. No aneurysm or dissection. Hypogastric artery  patent. Proximal femoral arteries patent. Veins: Unremarkable appearance of the venous system. Review of the MIP images confirms the above findings. NON-VASCULAR Lower chest: Differential attenuation of the blood pool and the myocardium. Calcifications of the native coronary arteries. Right pleural effusion with associated atelectasis. Hepatobiliary: Diffusely decreased attenuation of liver parenchyma. Hyperdense material within the gallbladder lumen. Pancreas: Unremarkable pancreas Spleen: Unremarkable spleen Adrenals/Urinary Tract: Unremarkable adrenal glands. Right: Redemonstration of perinephric hematoma of the right, with scalloping of the cortex at the lateral cortex and inferior medial cortex. The size of the perinephric hematoma is unchanged, on axial images 12 cm, compared to the prior of 12 cm. There is some decompression of hematoma inferiorly through the fascial planes into the pelvis, with overall decreased volume from the comparison. There also appears to be decompression superiorly, overlying the liver margin in the subdiaphragmatic region. The perfusion of the right kidney is maintained compared to the left on the arterial phase and delayed phase. The resolution of the CT for evaluation of underlying lesions is reduced given the distortion of the kidney parenchyma and the presence of the blood products. Hyperdense focus at the inferior medial cortex measures 12 mm on image 46 of series 2. Hyperdense focus at the superior lateral cortex on image 32 of series 2. Both of these are present on the precontrast series and do not enhance on the post. No right-sided hydronephrosis. Left: Left kidney relatively unremarkable with low-density lesion on the lateral cortex, too small to characterize. No hydronephrosis. Unremarkable appearance of the urinary bladder . Stomach/Bowel: Unremarkable appearance of the stomach. Unremarkable appearance of small bowel. No evidence of obstruction. Colonic diverticula. No  associated inflammatory changes. Normal appendix. Lymphatic: No lymphadenopathy. Mesenteric: No free fluid or air. No adenopathy. Reproductive: Unremarkable appearance of the pelvic organs. Other: Small fat containing umbilical hernia. Musculoskeletal: No acute displaced fracture. IMPRESSION: Redemonstration of right perinephric hematoma, with no enlargement from the prior CT. Scalloping of the kidney cortex secondary to compression and subcapsular component, somewhat distorts the kidney parenchyma and limits the evaluation of the kidney for any underlying lesions. A follow-up contrast-enhanced CT or MR is recommended in 3 months-6 months to evaluate for a lesion as a potential source of hemorrhage. The current CT angiogram demonstrates no evidence of active extravasation. CT demonstrates evidence of anemia. Liver steatosis. Aortic Atherosclerosis (ICD10-I70.0). Signed, Dulcy Fanny. Dellia Nims, RPVI [facility] Electronically Signed   By: Corrie Mckusick D.O.   On: 06/25/2018 12:22    Microbiology: No results found for this or any previous visit (from the past 240 hour(s)).   Labs:  Basic Metabolic Panel: Recent Labs  Lab 06/23/18 1857 06/24/18 0716 06/25/18 0454  NA 134* 135 138  K 3.9 5.1 4.1  CL 107 108 106  CO2 19* 22 27  GLUCOSE 200* 190* 126*  BUN 13 17 13   CREATININE 0.94 0.98 0.71  CALCIUM 8.6* 7.9* 7.9*  MG 1.8  --  2.0  PHOS 3.8  --   --    Liver Function Tests: Recent Labs  Lab 06/23/18 1857 06/24/18 0716 06/25/18 0454  AST 35 24 20  ALT 67* 55* 42  ALKPHOS 39 32* 28*  BILITOT 0.5 0.4 0.5  PROT 6.9 6.3* 5.7*  ALBUMIN 4.1 3.6 3.4*   Recent Labs  Lab 06/23/18 1857  LIPASE 29   No results for input(s): AMMONIA in the last 168 hours. CBC: Recent Labs  Lab 06/23/18 1857  06/24/18 0716  06/25/18 0454 06/25/18 1200 06/25/18 1718 06/26/18 0537 06/27/18 0451  WBC 18.6*  --  16.8*  --  12.8*  --   --  12.9*  13.2*  NEUTROABS  --   --  12.9*  --  9.0*  --   --  9.5* 9.4*  HGB 13.3   < > 10.9*   < > 8.1* 7.7* 9.6* 9.0* 9.3*  HCT 42.3   < > 35.1*   < > 26.4* 24.8* 30.7* 28.9* 30.1*  MCV 94.6  --  97.2  --  98.1  --   --  96.3 97.1  PLT 322  --  298  --  202  --   --  186 209   < > = values in this interval not displayed.   Cardiac Enzymes: No results for input(s): CKTOTAL, CKMB, CKMBINDEX, TROPONINI in the last 168 hours. BNP: BNP (last 3 results) No results for input(s): BNP in the last 8760 hours.  ProBNP (last 3 results) No results for input(s): PROBNP in the last 8760 hours.  CBG: Recent Labs  Lab 06/26/18 0818 06/26/18 1200 06/26/18 1714 06/26/18 2121 06/27/18 0745  GLUCAP 132* 140* 113* 107* 111*       Signed:  Alma Friendly, MD [facility] 06/27/2018, 12:03 PM

## 2018-06-29 ENCOUNTER — Ambulatory Visit: Payer: BLUE CROSS/BLUE SHIELD | Admitting: Neurology

## 2018-06-29 ENCOUNTER — Telehealth: Payer: Self-pay | Admitting: Neurology

## 2018-06-29 NOTE — Telephone Encounter (Signed)
This patient did not show for a RV appointment today, he was just discharged from the hospital 2 days ago.

## 2018-06-30 ENCOUNTER — Telehealth: Payer: Self-pay | Admitting: Physician Assistant

## 2018-06-30 ENCOUNTER — Encounter: Payer: Self-pay | Admitting: Neurology

## 2018-06-30 NOTE — Telephone Encounter (Signed)
appt scheduled Pt notified 

## 2018-07-01 ENCOUNTER — Encounter: Payer: Self-pay | Admitting: Physician Assistant

## 2018-07-01 ENCOUNTER — Ambulatory Visit: Payer: BLUE CROSS/BLUE SHIELD | Admitting: Physician Assistant

## 2018-07-01 VITALS — BP 144/83 | HR 85 | Temp 97.1°F | Ht 75.0 in | Wt 294.6 lb

## 2018-07-01 DIAGNOSIS — G8929 Other chronic pain: Secondary | ICD-10-CM

## 2018-07-01 DIAGNOSIS — D500 Iron deficiency anemia secondary to blood loss (chronic): Secondary | ICD-10-CM

## 2018-07-01 DIAGNOSIS — S37019A Minor contusion of unspecified kidney, initial encounter: Secondary | ICD-10-CM | POA: Diagnosis not present

## 2018-07-01 DIAGNOSIS — J438 Other emphysema: Secondary | ICD-10-CM

## 2018-07-01 DIAGNOSIS — M5442 Lumbago with sciatica, left side: Secondary | ICD-10-CM

## 2018-07-01 MED ORDER — OXYCODONE HCL 10 MG PO TABS: 20.0000 mg | ORAL_TABLET | Freq: Four times a day (QID) | ORAL | 0 refills | Status: AC | PRN

## 2018-07-01 NOTE — Progress Notes (Signed)
BP (!) 144/83   Pulse 85   Temp (!) 97.1 F (36.2 C) (Oral)   Ht 6\' 3"  (1.905 m)   Wt 294 lb 9.6 oz (133.6 kg)   SpO2 94%   BMI 36.82 kg/m    Subjective:    Patient ID: William Carson, male    DOB: 05-15-1966, 52 y.o.   MRN: [id number]  HPI: William Carson is a 52 y.o. male presenting on 07/01/2018 for Hospitalization Follow-up  This patient comes in for 1 week follow-up on his hospitalization.  He was hospitalized for a peri-trach hematoma.  There was no trauma involved.  He did become anemic because of the bleed.  He still is experiencing significant back pain and distended abdomen.  He is having bruising that is coming to the skin now.  He is not able to lay flat whatsoever.  He is able to sleep in his recliner.  CT was followed up while he was still in the hospital and there was no change.  He does have future follow-up with pulmonology and urology.  He is coming today and we will follow-up on his hemoglobin.  At discharge it was 9.3.  He does need refills on Symbicort.  He is checking his oxygen shins at home and they have gotten as low as 90 but generally are 93 or above.  Here in the office today is 94%.  He does appear to have significant discomfort even while in the room.  I observed him walking down the hallway and it was quite slow and difficult.  He is accompanied by his wife.    Past Medical History:  Diagnosis Date  . Adenomatous polyps   . COPD (chronic obstructive pulmonary disease) ([place])   . Coronary artery disease   . Diabetes mellitus without complication ([place])   . Diverticulosis   . Hemorrhoids   . Hyperlipidemia   . Influenza A 10/20/2015  . Obesity    Relevant past medical, surgical, family and social history reviewed and updated as indicated. Interim medical history since our last visit reviewed. Allergies and medications reviewed and updated. DATA REVIEWED: CHART IN EPIC  Family History reviewed for pertinent findings.  Review of Systems    Constitutional: Positive for fatigue. Negative for appetite change.  HENT: Negative.   Eyes: Negative.  Negative for pain and visual disturbance.  Respiratory: Positive for shortness of breath. Negative for cough, chest tightness and wheezing.   Cardiovascular: Negative.  Negative for chest pain, palpitations and leg swelling.  Gastrointestinal: Positive for abdominal pain. Negative for diarrhea, nausea and vomiting.  Endocrine: Negative.   Genitourinary: Negative.   Musculoskeletal: Positive for arthralgias, back pain, gait problem and myalgias.  Skin: Negative.  Negative for color change and rash.  Neurological: Positive for weakness. Negative for numbness and headaches.  Psychiatric/Behavioral: Negative.     Allergies as of 07/01/2018      Reactions   Crestor [rosuvastatin Calcium] Other (See Comments)   myalgias   Depakote [divalproex Sodium]    Stomach pains   Lipitor [atorvastatin] Other (See Comments)   Myalgias   Meclizine    Increased dizziness   Other Swelling   Mammelain meat allergy   Simvastatin Other (See Comments)   Myalgias   Topamax [topiramate]    Back pain, tinnitus   Zonegran [zonisamide]    Increased headache      Medication List        Accurate as of 07/01/18  1:52 PM. Always use your most  recent med list.          acetaminophen 500 MG tablet Commonly known as:  TYLENOL Take 2 tablets (1,000 mg total) by mouth every 6 (six) hours as needed for mild pain.   albuterol (2.5 MG/3ML) 0.083% nebulizer solution Commonly known as:  PROVENTIL Take 3 mLs (2.5 mg total) by nebulization every 4 (four) hours as needed for wheezing or shortness of breath.   albuterol 108 (90 Base) MCG/ACT inhaler Commonly known as:  PROVENTIL HFA;VENTOLIN HFA Inhale 1-2 puffs into the lungs every 6 (six) hours as needed for wheezing or shortness of breath.   amLODipine 5 MG tablet Commonly known as:  NORVASC Take 1 tablet (5 mg total) by mouth daily.    budesonide-formoterol 160-4.5 MCG/ACT inhaler Commonly known as:  SYMBICORT Inhale 2 puffs into the lungs 2 (two) times daily.   glucose blood test strip Use to check BG up to once daily   LIVALO 4 MG Tabs Generic drug:  Pitavastatin Calcium TAKE 1 TABLET DAILY   nitroGLYCERIN 0.4 MG SL tablet Commonly known as:  NITROSTAT Place 1 tablet (0.4 mg total) under the tongue every 5 (five) minutes as needed for chest pain. May repeat up to 3 doses.   ONETOUCH DELICA LANCETS 55D Misc Use to check BG up to once daily   Oxycodone HCl 10 MG Tabs Take 2 tablets (20 mg total) by mouth every 6 (six) hours as needed.   polyethylene glycol packet Commonly known as:  MIRALAX / GLYCOLAX Take 17 g by mouth daily.   senna-docusate 8.6-50 MG tablet Commonly known as:  Senokot-S Take 1 tablet by mouth 2 (two) times daily.   SitaGLIPtin-MetFORMIN HCl [phone] MG Tb24 Take 1 tablet by mouth daily.   venlafaxine XR 150 MG 24 hr capsule Commonly known as:  EFFEXOR-XR Take 1 capsule (150 mg total) by mouth daily with breakfast.          Objective:    BP (!) 144/83   Pulse 85   Temp (!) 97.1 F (36.2 C) (Oral)   Ht 6\' 3"  (1.905 m)   Wt 294 lb 9.6 oz (133.6 kg)   SpO2 94%   BMI 36.82 kg/m   Allergies  Allergen Reactions  . Crestor [Rosuvastatin Calcium] Other (See Comments)    myalgias  . Depakote [Divalproex Sodium]     Stomach pains  . Lipitor [Atorvastatin] Other (See Comments)    Myalgias   . Meclizine     Increased dizziness  . Other Swelling    Mammelain meat allergy  . Simvastatin Other (See Comments)    Myalgias   . Topamax [Topiramate]     Back pain, tinnitus  . Zonegran [Zonisamide]     Increased headache    Wt Readings from Last 3 Encounters:  07/01/18 294 lb 9.6 oz (133.6 kg)  06/23/18 285 lb (129.3 kg)  03/30/18 280 lb 3.2 oz (127.1 kg)    Physical Exam  Constitutional: He appears well-developed and well-nourished. He appears distressed.  HENT:   Head: Normocephalic and atraumatic.  Eyes: Pupils are equal, round, and reactive to light. Conjunctivae and EOM are normal.  Neck: Normal range of motion. Neck supple.  Cardiovascular: Normal rate, regular rhythm and normal heart sounds.  Pulmonary/Chest: Effort normal and breath sounds normal. He has no wheezes. He exhibits tenderness.  Abdominal: Soft. Bowel sounds are normal. He exhibits distension. There is tenderness. There is no rebound and no guarding.  Musculoskeletal: Normal range of motion.  Skin: Skin  is warm and dry.    Results for orders placed or performed during the hospital encounter of 06/23/18  Lipase, blood  Result Value Ref Range   Lipase 29 11 - 51 U/L  Comprehensive metabolic panel  Result Value Ref Range   Sodium 134 (L) 135 - 145 mmol/L   Potassium 3.9 3.5 - 5.1 mmol/L   Chloride 107 98 - 111 mmol/L   CO2 19 (L) 22 - 32 mmol/L   Glucose, Bld 200 (H) 70 - 99 mg/dL   BUN 13 6 - 20 mg/dL   Creatinine, Ser 0.94 0.61 - 1.24 mg/dL   Calcium 8.6 (L) 8.9 - 10.3 mg/dL   Total Protein 6.9 6.5 - 8.1 g/dL   Albumin 4.1 3.5 - 5.0 g/dL   AST 35 15 - 41 U/L   ALT 67 (H) 0 - 44 U/L   Alkaline Phosphatase 39 38 - 126 U/L   Total Bilirubin 0.5 0.3 - 1.2 mg/dL   GFR calc non Af Amer >60 >60 mL/min   GFR calc Af Amer >60 >60 mL/min   Anion gap 8 5 - 15  CBC  Result Value Ref Range   WBC 18.6 (H) 4.0 - 10.5 K/uL   RBC 4.47 4.22 - 5.81 MIL/uL   Hemoglobin 13.3 13.0 - 17.0 g/dL   HCT 42.3 39.0 - 52.0 %   MCV 94.6 80.0 - 100.0 fL   MCH 29.8 26.0 - 34.0 pg   MCHC 31.4 30.0 - 36.0 g/dL   RDW 14.6 11.5 - 15.5 %   Platelets 322 150 - 400 K/uL   nRBC 0.0 0.0 - 0.2 %  Urinalysis, Routine w reflex microscopic  Result Value Ref Range   Color, Urine YELLOW YELLOW   APPearance CLEAR CLEAR   Specific Gravity, Urine >1.046 (H) 1.005 - 1.030   pH 5.0 5.0 - 8.0   Glucose, UA NEGATIVE NEGATIVE mg/dL   Hgb urine dipstick MODERATE (A) NEGATIVE   Bilirubin Urine NEGATIVE NEGATIVE    Ketones, ur NEGATIVE NEGATIVE mg/dL   Protein, ur 30 (A) NEGATIVE mg/dL   Nitrite NEGATIVE NEGATIVE   Leukocytes, UA NEGATIVE NEGATIVE   RBC / HPF 21-50 0 - 5 RBC/hpf   WBC, UA 0-5 0 - 5 WBC/hpf   Bacteria, UA NONE SEEN NONE SEEN   Squamous Epithelial / LPF 0-5 0 - 5   Mucus PRESENT   Hemoglobin and hematocrit, blood  Result Value Ref Range   Hemoglobin 11.5 (L) 13.0 - 17.0 g/dL   HCT 38.2 (L) 39.0 - 52.0 %  Protime-INR  Result Value Ref Range   Prothrombin Time 13.4 11.4 - 15.2 seconds   INR 1.03   APTT  Result Value Ref Range   aPTT 35 24 - 36 seconds  Magnesium  Result Value Ref Range   Magnesium 1.8 1.7 - 2.4 mg/dL  Phosphorus  Result Value Ref Range   Phosphorus 3.8 2.5 - 4.6 mg/dL  CBC WITH DIFFERENTIAL  Result Value Ref Range   WBC 16.8 (H) 4.0 - 10.5 K/uL   RBC 3.61 (L) 4.22 - 5.81 MIL/uL   Hemoglobin 10.9 (L) 13.0 - 17.0 g/dL   HCT 35.1 (L) 39.0 - 52.0 %   MCV 97.2 80.0 - 100.0 fL   MCH 30.2 26.0 - 34.0 pg   MCHC 31.1 30.0 - 36.0 g/dL   RDW 14.7 11.5 - 15.5 %   Platelets 298 150 - 400 K/uL   nRBC 0.0 0.0 - 0.2 %   Neutrophils  Relative % 77 %   Neutro Abs 12.9 (H) 1.7 - 7.7 K/uL   Lymphocytes Relative 8 %   Lymphs Abs 1.4 0.7 - 4.0 K/uL   Monocytes Relative 14 %   Monocytes Absolute 2.3 (H) 0.1 - 1.0 K/uL   Eosinophils Relative 0 %   Eosinophils Absolute 0.0 0.0 - 0.5 K/uL   Basophils Relative 0 %   Basophils Absolute 0.1 0.0 - 0.1 K/uL   Immature Granulocytes 1 %   Abs Immature Granulocytes 0.12 (H) 0.00 - 0.07 K/uL  Comprehensive metabolic panel  Result Value Ref Range   Sodium 135 135 - 145 mmol/L   Potassium 5.1 3.5 - 5.1 mmol/L   Chloride 108 98 - 111 mmol/L   CO2 22 22 - 32 mmol/L   Glucose, Bld 190 (H) 70 - 99 mg/dL   BUN 17 6 - 20 mg/dL   Creatinine, Ser 0.98 0.61 - 1.24 mg/dL   Calcium 7.9 (L) 8.9 - 10.3 mg/dL   Total Protein 6.3 (L) 6.5 - 8.1 g/dL   Albumin 3.6 3.5 - 5.0 g/dL   AST 24 15 - 41 U/L   ALT 55 (H) 0 - 44 U/L   Alkaline  Phosphatase 32 (L) 38 - 126 U/L   Total Bilirubin 0.4 0.3 - 1.2 mg/dL   GFR calc non Af Amer >60 >60 mL/min   GFR calc Af Amer >60 >60 mL/min   Anion gap 5 5 - 15  CBC WITH DIFFERENTIAL  Result Value Ref Range   WBC 12.8 (H) 4.0 - 10.5 K/uL   RBC 2.69 (L) 4.22 - 5.81 MIL/uL   Hemoglobin 8.1 (L) 13.0 - 17.0 g/dL   HCT 26.4 (L) 39.0 - 52.0 %   MCV 98.1 80.0 - 100.0 fL   MCH 30.1 26.0 - 34.0 pg   MCHC 30.7 30.0 - 36.0 g/dL   RDW 14.8 11.5 - 15.5 %   Platelets 202 150 - 400 K/uL   nRBC 0.0 0.0 - 0.2 %   Neutrophils Relative % 70 %   Neutro Abs 9.0 (H) 1.7 - 7.7 K/uL   Lymphocytes Relative 13 %   Lymphs Abs 1.7 0.7 - 4.0 K/uL   Monocytes Relative 16 %   Monocytes Absolute 2.0 (H) 0.1 - 1.0 K/uL   Eosinophils Relative 0 %   Eosinophils Absolute 0.0 0.0 - 0.5 K/uL   Basophils Relative 0 %   Basophils Absolute 0.1 0.0 - 0.1 K/uL   Immature Granulocytes 1 %   Abs Immature Granulocytes 0.11 (H) 0.00 - 0.07 K/uL  HIV antibody (Routine Testing)  Result Value Ref Range   HIV Screen 4th Generation wRfx Non Reactive Non Reactive  Hemoglobin and hematocrit, blood  Result Value Ref Range   Hemoglobin 8.8 (L) 13.0 - 17.0 g/dL   HCT 28.6 (L) 39.0 - 52.0 %  Comprehensive metabolic panel  Result Value Ref Range   Sodium 138 135 - 145 mmol/L   Potassium 4.1 3.5 - 5.1 mmol/L   Chloride 106 98 - 111 mmol/L   CO2 27 22 - 32 mmol/L   Glucose, Bld 126 (H) 70 - 99 mg/dL   BUN 13 6 - 20 mg/dL   Creatinine, Ser 0.71 0.61 - 1.24 mg/dL   Calcium 7.9 (L) 8.9 - 10.3 mg/dL   Total Protein 5.7 (L) 6.5 - 8.1 g/dL   Albumin 3.4 (L) 3.5 - 5.0 g/dL   AST 20 15 - 41 U/L   ALT 42 0 - 44 U/L  Alkaline Phosphatase 28 (L) 38 - 126 U/L   Total Bilirubin 0.5 0.3 - 1.2 mg/dL   GFR calc non Af Amer >60 >60 mL/min   GFR calc Af Amer >60 >60 mL/min   Anion gap 5 5 - 15  Magnesium  Result Value Ref Range   Magnesium 2.0 1.7 - 2.4 mg/dL  Hemoglobin and hematocrit, blood  Result Value Ref Range   Hemoglobin  7.7 (L) 13.0 - 17.0 g/dL   HCT 24.8 (L) 39.0 - 52.0 %  Hemoglobin and hematocrit, blood  Result Value Ref Range   Hemoglobin 8.6 (L) 13.0 - 17.0 g/dL   HCT 28.3 (L) 39.0 - 52.0 %  Protime-INR  Result Value Ref Range   Prothrombin Time 13.5 11.4 - 15.2 seconds   INR 1.03   APTT  Result Value Ref Range   aPTT 45 (H) 24 - 36 seconds  Hemoglobin and hematocrit, blood  Result Value Ref Range   Hemoglobin 9.6 (L) 13.0 - 17.0 g/dL   HCT 30.7 (L) 39.0 - 52.0 %  CBC WITH DIFFERENTIAL  Result Value Ref Range   WBC 12.9 (H) 4.0 - 10.5 K/uL   RBC 3.00 (L) 4.22 - 5.81 MIL/uL   Hemoglobin 9.0 (L) 13.0 - 17.0 g/dL   HCT 28.9 (L) 39.0 - 52.0 %   MCV 96.3 80.0 - 100.0 fL   MCH 30.0 26.0 - 34.0 pg   MCHC 31.1 30.0 - 36.0 g/dL   RDW 16.6 (H) 11.5 - 15.5 %   Platelets 186 150 - 400 K/uL   nRBC 0.2 0.0 - 0.2 %   Neutrophils Relative % 73 %   Neutro Abs 9.5 (H) 1.7 - 7.7 K/uL   Lymphocytes Relative 8 %   Lymphs Abs 1.0 0.7 - 4.0 K/uL   Monocytes Relative 17 %   Monocytes Absolute 2.2 (H) 0.1 - 1.0 K/uL   Eosinophils Relative 1 %   Eosinophils Absolute 0.1 0.0 - 0.5 K/uL   Basophils Relative 0 %   Basophils Absolute 0.1 0.0 - 0.1 K/uL   Immature Granulocytes 1 %   Abs Immature Granulocytes 0.11 (H) 0.00 - 0.07 K/uL  Glucose, capillary  Result Value Ref Range   Glucose-Capillary 113 (H) 70 - 99 mg/dL  Glucose, capillary  Result Value Ref Range   Glucose-Capillary 128 (H) 70 - 99 mg/dL  Urinalysis, Routine w reflex microscopic  Result Value Ref Range   Color, Urine AMBER (A) YELLOW   APPearance CLEAR CLEAR   Specific Gravity, Urine 1.025 1.005 - 1.030   pH 6.0 5.0 - 8.0   Glucose, UA NEGATIVE NEGATIVE mg/dL   Hgb urine dipstick NEGATIVE NEGATIVE   Bilirubin Urine NEGATIVE NEGATIVE   Ketones, ur NEGATIVE NEGATIVE mg/dL   Protein, ur NEGATIVE NEGATIVE mg/dL   Nitrite NEGATIVE NEGATIVE   Leukocytes, UA NEGATIVE NEGATIVE   RBC / HPF 0-5 0 - 5 RBC/hpf   WBC, UA 0-5 0 - 5 WBC/hpf    Bacteria, UA RARE (A) NONE SEEN   Mucus PRESENT   Glucose, capillary  Result Value Ref Range   Glucose-Capillary 132 (H) 70 - 99 mg/dL   Comment 1 Notify RN   CBC WITH DIFFERENTIAL  Result Value Ref Range   WBC 13.2 (H) 4.0 - 10.5 K/uL   RBC 3.10 (L) 4.22 - 5.81 MIL/uL   Hemoglobin 9.3 (L) 13.0 - 17.0 g/dL   HCT 30.1 (L) 39.0 - 52.0 %   MCV 97.1 80.0 - 100.0 fL  MCH 30.0 26.0 - 34.0 pg   MCHC 30.9 30.0 - 36.0 g/dL   RDW 16.3 (H) 11.5 - 15.5 %   Platelets 209 150 - 400 K/uL   nRBC 0.3 (H) 0.0 - 0.2 %   Neutrophils Relative % 70 %   Neutro Abs 9.4 (H) 1.7 - 7.7 K/uL   Lymphocytes Relative 10 %   Lymphs Abs 1.4 0.7 - 4.0 K/uL   Monocytes Relative 17 %   Monocytes Absolute 2.2 (H) 0.1 - 1.0 K/uL   Eosinophils Relative 1 %   Eosinophils Absolute 0.1 0.0 - 0.5 K/uL   Basophils Relative 1 %   Basophils Absolute 0.1 0.0 - 0.1 K/uL   Immature Granulocytes 1 %   Abs Immature Granulocytes 0.13 (H) 0.00 - 0.07 K/uL  Glucose, capillary  Result Value Ref Range   Glucose-Capillary 140 (H) 70 - 99 mg/dL   Comment 1 Notify RN   Glucose, capillary  Result Value Ref Range   Glucose-Capillary 113 (H) 70 - 99 mg/dL   Comment 1 Notify RN   Glucose, capillary  Result Value Ref Range   Glucose-Capillary 107 (H) 70 - 99 mg/dL  Glucose, capillary  Result Value Ref Range   Glucose-Capillary 111 (H) 70 - 99 mg/dL  Glucose, capillary  Result Value Ref Range   Glucose-Capillary 95 70 - 99 mg/dL  Type and screen [facility]  Result Value Ref Range   ABO/RH(D) O POS    Antibody Screen NEG    Sample Expiration      06/27/2018 Performed at [facility], [address], [place], [place] [address]   Type and screen [facility]  Result Value Ref Range   ABO/RH(D) O POS    Antibody Screen NEG    Sample Expiration 06/27/2018    Unit Number [id number]    Blood Component Type RED CELLS,LR    Unit division 00    Status of Unit ISSUED,FINAL    Transfusion Status OK  TO TRANSFUSE    Crossmatch Result      Compatible Performed at [facility], [place] [address] [place], [place] [address]    Unit Number [id number]    Blood Component Type RED CELLS,LR    Unit division 00    Status of Unit ISSUED,FINAL    Transfusion Status OK TO TRANSFUSE    Crossmatch Result Compatible   Prepare RBC  Result Value Ref Range   Order Confirmation      ORDER PROCESSED BY BLOOD BANK Performed at [facility] [address], [place], [place] [address]   ABO/Rh  Result Value Ref Range   ABO/RH(D)      O POS Performed at [facility] [address] [place], [place] [address]   Prepare RBC  Result Value Ref Range   Order Confirmation      ORDER PROCESSED BY BLOOD BANK Performed at [facility] [address], [place],  [address]   BPAM [facility]  Result Value Ref Range   ISSUE DATE / TIME [id number]    Blood Product Unit Number [id number]    PRODUCT CODE [id number]    Unit Type and Rh 5100    Blood Product Expiration Date [id number]    ISSUE DATE / TIME [id number]    Blood Product Unit Number [id number]    PRODUCT CODE [id number]    Unit Type and Rh 5100    Blood Product Expiration Date [phone]  Assessment & Plan:   1. Iron deficiency anemia due to chronic blood loss - CBC with Differential  2. Perinephric hematoma - Oxycodone HCl 10 MG TABS; Take 2 tablets (20 mg total) by mouth every 6 (six) hours as needed.  Dispense: 180 tablet; Refill: 0  3. Other emphysema ([place]) Continue current regimen  4. Chronic left-sided low back pain with left-sided sciatica - Oxycodone HCl 10 MG TABS; Take 2 tablets (20 mg total) by mouth every 6 (six) hours as needed.  Dispense: 180 tablet; Refill: 0   Continue all other maintenance medications as listed above.  Follow up plan: Return in about 4 weeks (around 07/29/2018).  Educational handout given for [place] PA-C [facility] [address]  [place], [place] [address] [phone]   07/01/2018, 1:52 PM

## 2018-07-02 LAB — CBC WITH DIFFERENTIAL/PLATELET
Basophils Absolute: 0.1 10*3/uL (ref 0.0–0.2)
Basos: 1 %
EOS (ABSOLUTE): 0.2 10*3/uL (ref 0.0–0.4)
Eos: 2 %
HEMATOCRIT: 31.4 % — AB (ref 37.5–51.0)
Hemoglobin: 10.3 g/dL — ABNORMAL LOW (ref 13.0–17.7)
IMMATURE GRANS (ABS): 0.5 10*3/uL — AB (ref 0.0–0.1)
Immature Granulocytes: 4 %
Lymphocytes Absolute: 1.6 10*3/uL (ref 0.7–3.1)
Lymphs: 13 %
MCH: 29.8 pg (ref 26.6–33.0)
MCHC: 32.8 g/dL (ref 31.5–35.7)
MCV: 91 fL (ref 79–97)
Monocytes Absolute: 1.6 10*3/uL — ABNORMAL HIGH (ref 0.1–0.9)
Monocytes: 14 %
NRBC: 1 % — ABNORMAL HIGH (ref 0–0)
Neutrophils Absolute: 8 10*3/uL — ABNORMAL HIGH (ref 1.4–7.0)
Neutrophils: 66 %
Platelets: 382 10*3/uL (ref 150–450)
RBC: 3.46 x10E6/uL — ABNORMAL LOW (ref 4.14–5.80)
RDW: 14.4 % (ref 12.3–15.4)
WBC: 11.9 10*3/uL — AB (ref 3.4–10.8)

## 2018-07-03 ENCOUNTER — Telehealth: Payer: Self-pay | Admitting: Physician Assistant

## 2018-07-03 NOTE — Telephone Encounter (Signed)
Has reviewed now

## 2018-07-03 NOTE — Telephone Encounter (Signed)
Pt aware of results 

## 2018-08-04 DIAGNOSIS — D4101 Neoplasm of uncertain behavior of right kidney: Secondary | ICD-10-CM | POA: Diagnosis not present

## 2018-08-05 ENCOUNTER — Encounter: Payer: Self-pay | Admitting: Physician Assistant

## 2018-08-05 ENCOUNTER — Ambulatory Visit: Payer: BLUE CROSS/BLUE SHIELD | Admitting: Physician Assistant

## 2018-08-05 VITALS — BP 140/94 | HR 91 | Temp 97.5°F | Ht 75.0 in | Wt 276.4 lb

## 2018-08-05 DIAGNOSIS — D500 Iron deficiency anemia secondary to blood loss (chronic): Secondary | ICD-10-CM | POA: Diagnosis not present

## 2018-08-05 DIAGNOSIS — S37019A Minor contusion of unspecified kidney, initial encounter: Secondary | ICD-10-CM | POA: Diagnosis not present

## 2018-08-05 DIAGNOSIS — J438 Other emphysema: Secondary | ICD-10-CM

## 2018-08-05 MED ORDER — NITROGLYCERIN 0.4 MG SL SUBL: 0.4000 mg | SUBLINGUAL_TABLET | SUBLINGUAL | 3 refills | Status: AC | PRN

## 2018-08-05 MED ORDER — OXYCODONE HCL 10 MG PO TABS: 10.0000 mg | ORAL_TABLET | Freq: Four times a day (QID) | ORAL | 0 refills | Status: DC

## 2018-08-06 LAB — CBC WITH DIFFERENTIAL/PLATELET
Basophils Absolute: 0.1 10*3/uL (ref 0.0–0.2)
Basos: 1 %
EOS (ABSOLUTE): 0.4 10*3/uL (ref 0.0–0.4)
Eos: 3 %
Hematocrit: 45 % (ref 37.5–51.0)
Hemoglobin: 15 g/dL (ref 13.0–17.7)
Immature Grans (Abs): 0 10*3/uL (ref 0.0–0.1)
Immature Granulocytes: 0 %
Lymphocytes Absolute: 2.4 10*3/uL (ref 0.7–3.1)
Lymphs: 21 %
MCH: 28.9 pg (ref 26.6–33.0)
MCHC: 33.3 g/dL (ref 31.5–35.7)
MCV: 87 fL (ref 79–97)
MONOS ABS: 1.1 10*3/uL — AB (ref 0.1–0.9)
Monocytes: 10 %
Neutrophils Absolute: 7.3 10*3/uL — ABNORMAL HIGH (ref 1.4–7.0)
Neutrophils: 65 %
Platelets: 349 10*3/uL (ref 150–450)
RBC: 5.19 x10E6/uL (ref 4.14–5.80)
RDW: 14.7 % (ref 11.6–15.4)
WBC: 11.3 10*3/uL — ABNORMAL HIGH (ref 3.4–10.8)

## 2018-08-06 NOTE — Progress Notes (Signed)
BP (!) 140/94   Pulse 91   Temp (!) 97.5 F (36.4 C) (Oral)   Ht 6\' 3"  (1.905 m)   Wt 276 lb 6.4 oz (125.4 kg)   BMI 34.55 kg/m    Subjective:    Patient ID: William Carson, male    DOB: 23-Mar-1966, 53 y.o.   MRN: [id number]  HPI: William Carson is a 53 y.o. male presenting on 08/05/2018 for Medical Management of Chronic Issues (4 week follow up)  This patient comes in for periodic recheck on medications and conditions including perinephric hematoma, history of anemia normal chronic pain from osteoarthritis.  He states overall he is doing very well.  He did see his urologist and a CT scan will be performed next month..   All medications are reviewed today. There are no reports of any problems with the medications. All of the medical conditions are reviewed and updated.  Lab work is reviewed and will be ordered as medically necessary. There are no new problems reported with today's visit.   Past Medical History:  Diagnosis Date  . Adenomatous polyps   . COPD (chronic obstructive pulmonary disease) ([place])   . Coronary artery disease   . Diabetes mellitus without complication ([place])   . Diverticulosis   . Hemorrhoids   . Hyperlipidemia   . Influenza A 10/20/2015  . Obesity    Relevant past medical, surgical, family and social history reviewed and updated as indicated. Interim medical history since our last visit reviewed. Allergies and medications reviewed and updated. DATA REVIEWED: CHART IN EPIC  Family History reviewed for pertinent findings.  Review of Systems  Constitutional: Negative.  Negative for appetite change and fatigue.  Eyes: Negative for pain and visual disturbance.  Respiratory: Negative.  Negative for cough, chest tightness, shortness of breath and wheezing.   Cardiovascular: Negative.  Negative for chest pain, palpitations and leg swelling.  Gastrointestinal: Negative.  Negative for abdominal pain, diarrhea, nausea and vomiting.  Genitourinary: Negative.     Skin: Negative.  Negative for color change and rash.  Neurological: Negative.  Negative for weakness, numbness and headaches.  Psychiatric/Behavioral: Negative.     Allergies as of 08/05/2018      Reactions   Crestor [rosuvastatin Calcium] Other (See Comments)   myalgias   Depakote [divalproex Sodium]    Stomach pains   Lipitor [atorvastatin] Other (See Comments)   Myalgias   Meclizine    Increased dizziness   Other Swelling   Mammelain meat allergy   Simvastatin Other (See Comments)   Myalgias   Topamax [topiramate]    Back pain, tinnitus   Zonegran [zonisamide]    Increased headache      Medication List       Accurate as of August 05, 2018 11:59 PM. Always use your most recent med list.        albuterol (2.5 MG/3ML) 0.083% nebulizer solution Commonly known as:  PROVENTIL Take 3 mLs (2.5 mg total) by nebulization every 4 (four) hours as needed for wheezing or shortness of breath.   albuterol 108 (90 Base) MCG/ACT inhaler Commonly known as:  PROVENTIL HFA;VENTOLIN HFA Inhale 1-2 puffs into the lungs every 6 (six) hours as needed for wheezing or shortness of breath.   amLODipine 5 MG tablet Commonly known as:  NORVASC Take 1 tablet (5 mg total) by mouth daily.   budesonide-formoterol 160-4.5 MCG/ACT inhaler Commonly known as:  SYMBICORT Inhale 2 puffs into the lungs 2 (two) times daily.   glucose  blood test strip Commonly known as:  ONETOUCH VERIO Use to check BG up to once daily   LIVALO 4 MG Tabs Generic drug:  Pitavastatin Calcium TAKE 1 TABLET DAILY   nitroGLYCERIN 0.4 MG SL tablet Commonly known as:  NITROSTAT Place 1 tablet (0.4 mg total) under the tongue every 5 (five) minutes as needed for chest pain. May repeat up to 3 doses.   ONETOUCH DELICA LANCETS 16X Misc Use to check BG up to once daily   Oxycodone HCl 10 MG Tabs Take 1-2 tablets (10-20 mg total) by mouth every 6 (six) hours.   SitaGLIPtin-MetFORMIN HCl [id number] MG Tb24 Commonly known  as:  JANUMET XR Take 1 tablet by mouth daily.   venlafaxine XR 150 MG 24 hr capsule Commonly known as:  EFFEXOR XR Take 1 capsule (150 mg total) by mouth daily with breakfast.          Objective:    BP (!) 140/94   Pulse 91   Temp (!) 97.5 F (36.4 C) (Oral)   Ht 6\' 3"  (1.905 m)   Wt 276 lb 6.4 oz (125.4 kg)   BMI 34.55 kg/m   Allergies  Allergen Reactions  . Crestor [Rosuvastatin Calcium] Other (See Comments)    myalgias  . Depakote [Divalproex Sodium]     Stomach pains  . Lipitor [Atorvastatin] Other (See Comments)    Myalgias   . Meclizine     Increased dizziness  . Other Swelling    Mammelain meat allergy  . Simvastatin Other (See Comments)    Myalgias   . Topamax [Topiramate]     Back pain, tinnitus  . Zonegran [Zonisamide]     Increased headache    Wt Readings from Last 3 Encounters:  08/05/18 276 lb 6.4 oz (125.4 kg)  07/01/18 294 lb 9.6 oz (133.6 kg)  06/23/18 285 lb (129.3 kg)    Physical Exam Vitals signs and nursing note reviewed.  Constitutional:      General: He is not in acute distress.    Appearance: He is well-developed.  HENT:     Head: Normocephalic and atraumatic.  Eyes:     Conjunctiva/sclera: Conjunctivae normal.     Pupils: Pupils are equal, round, and reactive to light.  Cardiovascular:     Rate and Rhythm: Normal rate and regular rhythm.     Heart sounds: Normal heart sounds.  Pulmonary:     Effort: Pulmonary effort is normal. No respiratory distress.     Breath sounds: Normal breath sounds.  Skin:    General: Skin is warm and dry.  Psychiatric:        Behavior: Behavior normal.     Results for orders placed or performed in visit on 08/05/18  CBC with Differential  Result Value Ref Range   WBC 11.3 (H) 3.4 - 10.8 x10E3/uL   RBC 5.19 4.14 - 5.80 x10E6/uL   Hemoglobin 15.0 13.0 - 17.7 g/dL   Hematocrit 45.0 37.5 - 51.0 %   MCV 87 79 - 97 fL   MCH 28.9 26.6 - 33.0 pg   MCHC 33.3 31.5 - 35.7 g/dL   RDW 14.7 11.6 -  15.4 %   Platelets 349 150 - 450 x10E3/uL   Neutrophils 65 Not Estab. %   Lymphs 21 Not Estab. %   Monocytes 10 Not Estab. %   Eos 3 Not Estab. %   Basos 1 Not Estab. %   Neutrophils Absolute 7.3 (H) 1.4 - 7.0 x10E3/uL   Lymphocytes Absolute  2.4 0.7 - 3.1 x10E3/uL   Monocytes Absolute 1.1 (H) 0.1 - 0.9 x10E3/uL   EOS (ABSOLUTE) 0.4 0.0 - 0.4 x10E3/uL   Basophils Absolute 0.1 0.0 - 0.2 x10E3/uL   Immature Granulocytes 0 Not Estab. %   Immature Grans (Abs) 0.0 0.0 - 0.1 x10E3/uL      Assessment & Plan:   1. Perinephric hematoma - CBC with Differential  2. Iron deficiency anemia due to chronic blood loss - CBC with Differential  3. Other emphysema ([place]) Continue medications   Continue all other maintenance medications as listed above.  Follow up plan: Return in about 6 months (around 02/03/2019) for recheck and labs.  Educational handout given for [place] PA-C [facility] [address]  [place], [place] [address] [phone]   08/06/2018, 9:11 PM

## 2018-08-18 ENCOUNTER — Telehealth: Payer: Self-pay

## 2018-08-18 NOTE — Telephone Encounter (Signed)
Error

## 2018-08-18 NOTE — Telephone Encounter (Signed)
I contacted the pt to advise we had an appt come available today at 300 pm. Pt is listed on the wait list for an earlier appt. Pt stated this appt would not work for him. Pt will remain on wait list.

## 2018-08-24 ENCOUNTER — Ambulatory Visit: Payer: Self-pay | Admitting: Neurology

## 2018-08-24 ENCOUNTER — Telehealth: Payer: Self-pay | Admitting: Neurology

## 2018-08-24 NOTE — Telephone Encounter (Signed)
I contacted the pt on 08/21/2018 advising Dr. Jannifer Franklin had an appt come available for 08/24/18 for 7:30 am check in time of 7 am. On 08/21/18 pt was agreeable to appt date/time. Pt removed from wait list.

## 2018-08-24 NOTE — Telephone Encounter (Signed)
This patient did not show for a RV appointment today. 

## 2018-08-25 ENCOUNTER — Encounter: Payer: Self-pay | Admitting: Neurology

## 2018-08-26 ENCOUNTER — Other Ambulatory Visit: Payer: Self-pay | Admitting: Neurology

## 2018-08-27 DIAGNOSIS — J3089 Other allergic rhinitis: Secondary | ICD-10-CM | POA: Diagnosis not present

## 2018-08-27 DIAGNOSIS — R0602 Shortness of breath: Secondary | ICD-10-CM | POA: Diagnosis not present

## 2018-08-27 DIAGNOSIS — Z91018 Allergy to other foods: Secondary | ICD-10-CM | POA: Diagnosis not present

## 2018-08-27 DIAGNOSIS — R21 Rash and other nonspecific skin eruption: Secondary | ICD-10-CM | POA: Diagnosis not present

## 2018-08-29 ENCOUNTER — Other Ambulatory Visit: Payer: Self-pay | Admitting: Neurology

## 2018-09-09 DIAGNOSIS — D4101 Neoplasm of uncertain behavior of right kidney: Secondary | ICD-10-CM | POA: Diagnosis not present

## 2018-09-16 DIAGNOSIS — D4101 Neoplasm of uncertain behavior of right kidney: Secondary | ICD-10-CM | POA: Diagnosis not present

## 2018-09-16 DIAGNOSIS — S37091A Other injury of right kidney, initial encounter: Secondary | ICD-10-CM | POA: Diagnosis not present

## 2018-09-22 DIAGNOSIS — D4101 Neoplasm of uncertain behavior of right kidney: Secondary | ICD-10-CM | POA: Diagnosis not present

## 2018-09-28 ENCOUNTER — Ambulatory Visit: Payer: BLUE CROSS/BLUE SHIELD | Admitting: Physician Assistant

## 2018-10-03 ENCOUNTER — Other Ambulatory Visit: Payer: Self-pay | Admitting: Neurology

## 2018-11-04 ENCOUNTER — Ambulatory Visit: Payer: BLUE CROSS/BLUE SHIELD | Admitting: Neurology

## 2018-11-17 ENCOUNTER — Other Ambulatory Visit: Payer: Self-pay | Admitting: Neurology

## 2018-12-16 ENCOUNTER — Other Ambulatory Visit: Payer: Self-pay | Admitting: Neurology

## 2019-01-05 ENCOUNTER — Other Ambulatory Visit: Payer: Self-pay | Admitting: Cardiology

## 2019-01-12 ENCOUNTER — Telehealth: Payer: Self-pay | Admitting: Cardiology

## 2019-01-12 NOTE — Telephone Encounter (Signed)
smartphone/ consent/ my chart/ pre reg completed °

## 2019-01-15 ENCOUNTER — Other Ambulatory Visit: Payer: Self-pay | Admitting: Physician Assistant

## 2019-01-15 ENCOUNTER — Other Ambulatory Visit: Payer: Self-pay | Admitting: Neurology

## 2019-01-18 NOTE — Progress Notes (Signed)
Virtual Visit via Video Note changed to phone visit at patient request.   This visit type was conducted due to national recommendations for restrictions regarding the COVID-19 Pandemic (e.g. social distancing) in an effort to limit this patient's exposure and mitigate transmission in our community.  Due to his co-morbid illnesses, this patient is at least at moderate risk for complications without adequate follow up.  This format is felt to be most appropriate for this patient at this time.  All issues noted in this document were discussed and addressed.  A limited physical exam was performed with this format.  Please refer to the patient's chart for his consent to telehealth for [facility].   Date:  01/19/2019   ID:  [name], DOB Sep 21, 1965, MRN [id number]  Patient Location:Home Provider Location: Home  PCP:  Terald Sleeper, PA-C  Cardiologist:  Dr Stanford Breed  Evaluation Performed:  Follow-Up Visit  Chief Complaint:  FU CAD  History of Present Illness:    FU coronary artery disease. Admitted in November of 2011 and ruled in for a subendocardial myocardial infarctions. Cardiac catheterization performed on June 19, 2010 revealed normal LM; 30% proximal LAD; normal Lcx; 90% RCA;normal EF; patient had 2 drug-eluting stents to the right coronary artery at that time.Patient is intolerant to moststatinsbut now taking livalo.Exercise treadmill June 2018 normal.  Carotid Dopplers October 2018 showed less than 50% right and left internal carotid artery stenosis.  Had perinephric hematoma December 2019.  Follow-up CT recommended 6 months.  Since I last saw him, the patient denies any dyspnea on exertion, orthopnea, PND, pedal edema, palpitations, syncope or chest pain.    The patient does not have symptoms concerning for COVID-19 infection (fever, chills, cough, or new shortness of breath).    Past Medical History:  Diagnosis Date  . Adenomatous polyps   . COPD (chronic  obstructive pulmonary disease) ([place])   . Coronary artery disease   . Diabetes mellitus without complication ([place])   . Diverticulosis   . Hemorrhoids   . Hyperlipidemia   . Influenza A 10/20/2015  . Obesity    Past Surgical History:  Procedure Laterality Date  . ANTERIOR CRUCIATE LIGAMENT REPAIR     S/P  . CORONARY STENT PLACEMENT  2011   2 stents     Current Meds  Medication Sig  . albuterol (PROVENTIL HFA;VENTOLIN HFA) 108 (90 Base) MCG/ACT inhaler Inhale 1-2 puffs into the lungs every 6 (six) hours as needed for wheezing or shortness of breath.  Marland Kitchen albuterol (PROVENTIL) (2.5 MG/3ML) 0.083% nebulizer solution Take 3 mLs (2.5 mg total) by nebulization every 4 (four) hours as needed for wheezing or shortness of breath.  Marland Kitchen amLODipine (NORVASC) 5 MG tablet Take 1 tablet (5 mg total) by mouth daily.  . budesonide-formoterol (SYMBICORT) 160-4.5 MCG/ACT inhaler Inhale 2 puffs into the lungs 2 (two) times daily. (Patient taking differently: Inhale 2 puffs into the lungs every morning. )  . glucose blood (ONETOUCH VERIO) test strip Use to check BG up to once daily  . LIVALO 4 MG TABS TAKE 1 TABLET DAILY  . nitroGLYCERIN (NITROSTAT) 0.4 MG SL tablet Place 1 tablet (0.4 mg total) under the tongue every 5 (five) minutes as needed for chest pain. May repeat up to 3 doses.  Glory Rosebush DELICA LANCETS 70Y MISC Use to check BG up to once daily  . Oxycodone HCl 10 MG TABS Take 1-2 tablets (10-20 mg total) by mouth every 6 (six) hours.  . SitaGLIPtin-MetFORMIN HCl (  JANUMET XR) [phone] MG TB24 Take 1 tablet by mouth daily.  Marland Kitchen venlafaxine XR (EFFEXOR-XR) 150 MG 24 hr capsule TAKE 1 CAPSULE DAILY WITH BREAKFAST     Allergies:   Crestor [rosuvastatin calcium], Depakote [divalproex sodium], Lipitor [atorvastatin], Meclizine, Other, Simvastatin, Topamax [topiramate], and Zonegran [zonisamide]   Social History   Tobacco Use  . Smoking status: Former Smoker    Packs/day: 1.50    Years: 30.00    Pack  years: 45.00    Types: Cigarettes    Quit date: 10/16/2015    Years since quitting: 3.2  . Smokeless tobacco: Former Network engineer Use Topics  . Alcohol use: Yes    Alcohol/week: 1.0 standard drinks    Types: 1 Standard drinks or equivalent per week  . Drug use: No     Family Hx: The patient's family history includes Arrhythmia in his father; Heart attack in his maternal grandfather, mother, and paternal grandfather.  ROS:   Please see the history of present illness.    No Fever, chills  or productive cough All other systems reviewed and are negative.   Recent Labs: 06/25/2018: ALT 42; BUN 13; Creatinine, Ser 0.71; Magnesium 2.0; Potassium 4.1; Sodium 138 08/05/2018: Hemoglobin 15.0; Platelets 349   Recent Lipid Panel Lab Results  Component Value Date/Time   CHOL 128 03/30/2018 02:25 PM   TRIG 91 03/30/2018 02:25 PM   HDL 34 (L) 03/30/2018 02:25 PM   CHOLHDL 3.8 03/30/2018 02:25 PM   CHOLHDL 5 02/28/2012 10:10 AM   LDLCALC 76 03/30/2018 02:25 PM    Wt Readings from Last 3 Encounters:  01/19/19 270 lb (122.5 kg)  08/05/18 276 lb 6.4 oz (125.4 kg)  07/01/18 294 lb 9.6 oz (133.6 kg)     Objective:    Vital Signs:  BP 130/80   Ht 6\' 3"  (1.905 m)   Wt 270 lb (122.5 kg)   BMI 33.75 kg/m    VITAL SIGNS:  reviewed NAD Answers questions appropriately Normal affect Remainder of physical examination not performed (telehealth visit; coronavirus pandemic)  ASSESSMENT & PLAN:    1. Coronary artery disease-patient denies chest pain.  Plan to continue medical therapy with statin.  His aspirin was discontinued at time of perinephric hematoma.  I have asked him to discuss this with urology at his next evaluation.  I would like him on 81 mg daily long-term once urological issues are resolved. 2. Hypertension-patient's blood pressure is controlled.  Continue present medications and follow. 3. Hyperlipidemia-patient has had difficulties with statins but is tolerating livalo.  We  will check lipids and liver.  If LDL not at goal I will consider Zetia or Repatha. 4. Obesity-we discussed the importance of weight loss with diet and exercise. 5. History of perinephric hematoma-follow-up CT was recommended in 6 months.  [facility].  COVID-19 Education: The importance of social distancing was discussed today.  Time:   Today, I have spent 17 minutes with the patient with telehealth technology discussing the above problems.     Medication Adjustments/Labs and Tests Ordered: Current medicines are reviewed at length with the patient today.  Concerns regarding medicines are outlined above.   Tests Ordered: No orders of the defined types were placed in this encounter.   Medication Changes: No orders of the defined types were placed in this encounter.   Follow Up:  Virtual Visit or In Person in 1 year(s)  Signed, Kirk Ruths, MD  01/19/2019 8:18 AM    [place]

## 2019-01-18 NOTE — Telephone Encounter (Signed)
I called pt to confirm his appt with Dr Stanford Breed on 01-19-19.

## 2019-01-19 ENCOUNTER — Encounter: Payer: Self-pay | Admitting: Cardiology

## 2019-01-19 ENCOUNTER — Telehealth (INDEPENDENT_AMBULATORY_CARE_PROVIDER_SITE_OTHER): Payer: BC Managed Care – PPO | Admitting: Cardiology

## 2019-01-19 ENCOUNTER — Other Ambulatory Visit: Payer: Self-pay

## 2019-01-19 ENCOUNTER — Ambulatory Visit (INDEPENDENT_AMBULATORY_CARE_PROVIDER_SITE_OTHER): Payer: BC Managed Care – PPO | Admitting: Neurology

## 2019-01-19 ENCOUNTER — Encounter: Payer: Self-pay | Admitting: Neurology

## 2019-01-19 VITALS — BP 130/80 | Ht 75.0 in | Wt 270.0 lb

## 2019-01-19 DIAGNOSIS — I2510 Atherosclerotic heart disease of native coronary artery without angina pectoris: Secondary | ICD-10-CM | POA: Diagnosis not present

## 2019-01-19 DIAGNOSIS — E7800 Pure hypercholesterolemia, unspecified: Secondary | ICD-10-CM

## 2019-01-19 DIAGNOSIS — G43019 Migraine without aura, intractable, without status migrainosus: Secondary | ICD-10-CM

## 2019-01-19 DIAGNOSIS — G4489 Other headache syndrome: Secondary | ICD-10-CM

## 2019-01-19 DIAGNOSIS — I10 Essential (primary) hypertension: Secondary | ICD-10-CM

## 2019-01-19 DIAGNOSIS — R42 Dizziness and giddiness: Secondary | ICD-10-CM

## 2019-01-19 MED ORDER — AIMOVIG 140 MG/ML ~~LOC~~ SOAJ: 140.0000 mg | SUBCUTANEOUS | 4 refills | Status: DC

## 2019-01-19 MED ORDER — VENLAFAXINE HCL ER 37.5 MG PO CP24: ORAL_CAPSULE | ORAL | 0 refills | Status: DC

## 2019-01-19 NOTE — Progress Notes (Signed)
     Virtual Visit via Telephone Note  I connected with [name] on 01/19/19 at  4:00 PM EDT by telephone and verified that I am speaking with the correct person using two identifiers.  Location: Patient: The patient is at home. Provider: The physician is in office.   I discussed the limitations, risks, security and privacy concerns of performing an evaluation and management service by telephone and the availability of in person appointments. I also discussed with the patient that there may be a patient responsible charge related to this service. The patient expressed understanding and agreed to proceed.   History of Present Illness: William Carson is a 53 year old right-handed white male with a history of chronic headaches and dizziness.  The patient initially was having 2 headaches a week, since February 2020, he has had headaches daily.  The patient is still having dizziness with a rocking sensation that is persistent.  He initially gained some benefit when Effexor was added, this seemed to help translate with both the headaches and the dizziness but now both have returned.  The patient in the past has been on Topamax, Effexor, diazepam, and Zonegran without benefit with the dizziness.  He could not tolerate Depakote.  The patient is being reevaluated for this issue.   Observations/Objective: The visit was set up as a video visit, but the patient claims that he has lost Internet connection.  The evaluation was done over the telephone therefore.  The patient appeared to be alert and cooperative, he has normal speech pattern without aphasia or dysarthria.  Assessment and Plan: 1.  Chronic daily headache  2.  Chronic dizziness  The patient will be given a trial on Aimovig for the headache and dizziness.  He will be tapered down off the Effexor going to 75 mg daily for 2 weeks then go to 37.5 mg daily for 2 weeks and then stop the medication.  He will follow-up here in about 3 months.  Follow Up Instructions:    I discussed the assessment and treatment plan with the patient. The patient was provided an opportunity to ask questions and all were answered. The patient agreed with the plan and demonstrated an understanding of the instructions.   The patient was advised to call back or seek an in-person evaluation if the symptoms worsen or if the condition fails to improve as anticipated.  I provided 15 minutes of non-face-to-face time during this encounter.   Kathrynn Ducking, MD

## 2019-01-19 NOTE — Patient Instructions (Signed)
Medication Instructions:  NO CHANGE If you need a refill on your cardiac medications before your next appointment, please call your pharmacy.   Lab work: Your physician recommends that you return for lab work PRIOR TO EATING If you have labs (blood work) drawn today and your tests are completely normal, you will receive your results only by: . MyChart Message (if you have MyChart) OR . A paper copy in the mail If you have any lab test that is abnormal or we need to change your treatment, we will call you to review the results.  Follow-Up: At [facility], you and your health needs are our priority.  As part of our continuing mission to provide you with exceptional heart care, we have created designated Provider Care Teams.  These Care Teams include your primary Cardiologist (physician) and Advanced Practice Providers (APPs -  Physician Assistants and Nurse Practitioners) who all work together to provide you with the care you need, when you need it. You will need a follow up appointment in 12 months.  Please call our office 2 months in advance to schedule this appointment.  You may see BRIAN CRENSHAW MD or one of the following Advanced Practice Providers on your designated Care Team:   Luke Kilroy, PA-C Krista Kroeger, PA-C . Callie Goodrich, PA-C   

## 2019-01-20 ENCOUNTER — Encounter: Payer: Self-pay | Admitting: Neurology

## 2019-01-20 ENCOUNTER — Telehealth: Payer: Self-pay

## 2019-01-20 DIAGNOSIS — G43019 Migraine without aura, intractable, without status migrainosus: Secondary | ICD-10-CM

## 2019-01-20 HISTORY — DX: Migraine without aura, intractable, without status migrainosus: G43.019

## 2019-01-20 NOTE — Telephone Encounter (Signed)
This patient is having daily headaches, I will go back and addend the visit note to includ chronic intractable migraine headache.  He should be a candidate for Aimovig.

## 2019-01-20 NOTE — Telephone Encounter (Signed)
I have added the information Dr. Jannifer Franklin mentioned.  I have placed paper PA in his office to be reviewed and signed.

## 2019-01-20 NOTE — Telephone Encounter (Signed)
PA for Aimovig has been submitted through cover my meds.  (Key: [id number])  Your information has been submitted to [place]. [employer] [place] will review the request and fax you a determination directly, typically within 3 business days of your submission once all necessary information is received.  If [employer]  has not responded in 3 business days or if you have any questions about your submission, contact [place] at [phone].

## 2019-01-20 NOTE — Telephone Encounter (Signed)
PA for Aimivog requires documentation of 15 headaches per month along with 8 migraines per month or 8 headache attacks with in one day. In order to comply with insurance the dx of Chronic Migraine, Episodic migraine or Episodic Cluster headaches is requires.  The dx of headache syndrome/dizziness will not comply with the insurance guidelines. Will fwd information by to MD to review and further advise on.

## 2019-01-21 NOTE — Telephone Encounter (Signed)
PA has been faxed to [facility]. Fax # [phone], confirmation received.

## 2019-01-25 NOTE — Telephone Encounter (Signed)
Late Entry~ PA approved on 01/20/19-04/20/2019.

## 2019-02-02 ENCOUNTER — Telehealth: Payer: Self-pay | Admitting: Physician Assistant

## 2019-02-02 ENCOUNTER — Other Ambulatory Visit: Payer: Self-pay

## 2019-02-03 ENCOUNTER — Ambulatory Visit: Payer: BLUE CROSS/BLUE SHIELD | Admitting: Physician Assistant

## 2019-02-03 ENCOUNTER — Other Ambulatory Visit: Payer: Self-pay | Admitting: Physician Assistant

## 2019-02-03 ENCOUNTER — Encounter: Payer: Self-pay | Admitting: Physician Assistant

## 2019-02-03 VITALS — BP 142/88 | HR 85 | Temp 98.6°F | Ht 75.0 in | Wt 294.2 lb

## 2019-02-03 DIAGNOSIS — I2510 Atherosclerotic heart disease of native coronary artery without angina pectoris: Secondary | ICD-10-CM

## 2019-02-03 DIAGNOSIS — M5442 Lumbago with sciatica, left side: Secondary | ICD-10-CM | POA: Diagnosis not present

## 2019-02-03 DIAGNOSIS — E7800 Pure hypercholesterolemia, unspecified: Secondary | ICD-10-CM

## 2019-02-03 DIAGNOSIS — Z9861 Coronary angioplasty status: Secondary | ICD-10-CM | POA: Diagnosis not present

## 2019-02-03 DIAGNOSIS — I10 Essential (primary) hypertension: Secondary | ICD-10-CM

## 2019-02-03 DIAGNOSIS — R42 Dizziness and giddiness: Secondary | ICD-10-CM

## 2019-02-03 DIAGNOSIS — E119 Type 2 diabetes mellitus without complications: Secondary | ICD-10-CM

## 2019-02-03 DIAGNOSIS — G8929 Other chronic pain: Secondary | ICD-10-CM

## 2019-02-03 LAB — BAYER DCA HB A1C WAIVED: HB A1C (BAYER DCA - WAIVED): 6.4 % (ref <7.0)

## 2019-02-03 MED ORDER — ALBUTEROL SULFATE HFA 108 (90 BASE) MCG/ACT IN AERS: 1.0000 | INHALATION_SPRAY | Freq: Four times a day (QID) | RESPIRATORY_TRACT | 1 refills | Status: DC | PRN

## 2019-02-03 MED ORDER — LIVALO 4 MG PO TABS: 1.0000 | ORAL_TABLET | Freq: Every day | ORAL | 0 refills | Status: DC

## 2019-02-03 MED ORDER — OXYCODONE HCL 10 MG PO TABS: 10.0000 mg | ORAL_TABLET | Freq: Four times a day (QID) | ORAL | 0 refills | Status: DC

## 2019-02-03 MED ORDER — JANUMET XR 100-1000 MG PO TB24: 1.0000 | ORAL_TABLET | Freq: Every day | ORAL | 3 refills | Status: DC

## 2019-02-03 MED ORDER — AMLODIPINE BESYLATE 5 MG PO TABS: 5.0000 mg | ORAL_TABLET | Freq: Every day | ORAL | 0 refills | Status: DC

## 2019-02-04 LAB — LIPID PANEL
Chol/HDL Ratio: 4.3 ratio (ref 0.0–5.0)
Cholesterol, Total: 156 mg/dL (ref 100–199)
HDL: 36 mg/dL — ABNORMAL LOW (ref >39)
LDL Calculated: 94 mg/dL (ref 0–99)
Triglycerides: 130 mg/dL (ref 0–149)
VLDL Cholesterol Cal: 26 mg/dL (ref 5–40)

## 2019-02-04 LAB — CBC WITH DIFFERENTIAL/PLATELET
Basophils Absolute: 0.1 10*3/uL (ref 0.0–0.2)
Basos: 1 %
EOS (ABSOLUTE): 0.2 10*3/uL (ref 0.0–0.4)
Eos: 2 %
Hematocrit: 49.6 % (ref 37.5–51.0)
Hemoglobin: 16.1 g/dL (ref 13.0–17.7)
Immature Grans (Abs): 0.1 10*3/uL (ref 0.0–0.1)
Immature Granulocytes: 1 %
Lymphocytes Absolute: 2.3 10*3/uL (ref 0.7–3.1)
Lymphs: 22 %
MCH: 30.5 pg (ref 26.6–33.0)
MCHC: 32.5 g/dL (ref 31.5–35.7)
MCV: 94 fL (ref 79–97)
Monocytes Absolute: 1.2 10*3/uL — ABNORMAL HIGH (ref 0.1–0.9)
Monocytes: 12 %
Neutrophils Absolute: 6.5 10*3/uL (ref 1.4–7.0)
Neutrophils: 62 %
Platelets: 284 10*3/uL (ref 150–450)
RBC: 5.28 x10E6/uL (ref 4.14–5.80)
RDW: 14 % (ref 11.6–15.4)
WBC: 10.3 10*3/uL (ref 3.4–10.8)

## 2019-02-04 LAB — CMP14+EGFR
ALT: 77 IU/L — ABNORMAL HIGH (ref 0–44)
AST: 50 IU/L — ABNORMAL HIGH (ref 0–40)
Albumin/Globulin Ratio: 2 (ref 1.2–2.2)
Albumin: 4.5 g/dL (ref 3.8–4.9)
Alkaline Phosphatase: 46 IU/L (ref 39–117)
BUN/Creatinine Ratio: 18 (ref 9–20)
BUN: 13 mg/dL (ref 6–24)
Bilirubin Total: 0.3 mg/dL (ref 0.0–1.2)
CO2: 21 mmol/L (ref 20–29)
Calcium: 10.4 mg/dL — ABNORMAL HIGH (ref 8.7–10.2)
Chloride: 105 mmol/L (ref 96–106)
Creatinine, Ser: 0.72 mg/dL — ABNORMAL LOW (ref 0.76–1.27)
GFR calc Af Amer: 122 mL/min/{1.73_m2} (ref >59)
GFR calc non Af Amer: 106 mL/min/{1.73_m2} (ref >59)
Globulin, Total: 2.2 g/dL (ref 1.5–4.5)
Glucose: 82 mg/dL (ref 65–99)
Potassium: 4.4 mmol/L (ref 3.5–5.2)
Sodium: 141 mmol/L (ref 134–144)
Total Protein: 6.7 g/dL (ref 6.0–8.5)

## 2019-02-04 NOTE — Progress Notes (Signed)
BP (!) 142/88   Pulse 85   Temp 98.6 F (37 C) (Oral)   Ht _0  (1.905 m)   Wt 294 lb 3.2 oz (133.4 kg)   BMI 36.77 kg/m    Subjective:    Patient ID: William Carson, male    DOB: [birth year], 53 y.o.   MRN: [id number]  HPI: William Carson is a 53 y.o. male presenting on 02/03/2019 for Medical Management of Chronic Issues (6 month follow up) and Diabetes  This patient comes in for periodic recheck on his chronic medical conditions.  Which do include coronary artery disease, type 2 diabetes, chronic back pain with sciatica, hyperlipidemia.  He does have refills of that are needed.  And he will have labs performed.  He states that overall he has been doing very well and able to move and tolerate things very well.  He has had migraines have been much better.  He is tolerating the medication well that is given to him by neurology.  Past Medical History:  Diagnosis Date  . Adenomatous polyps   . Common migraine with intractable migraine 01/20/2019  . COPD (chronic obstructive pulmonary disease) ([place])   . Coronary artery disease   . Diabetes mellitus without complication ([place])   . Diverticulosis   . Hemorrhoids   . Hyperlipidemia   . Influenza A 10/20/2015  . Obesity    Relevant past medical, surgical, family and social history reviewed and updated as indicated. Interim medical history since our last visit reviewed. Allergies and medications reviewed and updated. DATA REVIEWED: CHART IN EPIC  Family History reviewed for pertinent findings.  Review of Systems  Constitutional: Negative.  Negative for appetite change and fatigue.  Eyes: Negative for pain and visual disturbance.  Respiratory: Negative.  Negative for cough, chest tightness, shortness of breath and wheezing.   Cardiovascular: Negative.  Negative for chest pain, palpitations and leg swelling.  Gastrointestinal: Negative.  Negative for abdominal pain, diarrhea, nausea and vomiting.  Genitourinary: Negative.    Musculoskeletal: Positive for arthralgias, back pain and myalgias.  Skin: Negative.  Negative for color change and rash.  Neurological: Positive for headaches. Negative for weakness and numbness.  Psychiatric/Behavioral: Negative.     Allergies as of 02/03/2019      Reactions   Crestor [rosuvastatin Calcium] Other (See Comments)   myalgias   Depakote [divalproex Sodium]    Stomach pains   Lipitor [atorvastatin] Other (See Comments)   Myalgias   Meclizine    Increased dizziness   Other Swelling   Mammelain meat allergy   Simvastatin Other (See Comments)   Myalgias   Topamax [topiramate]    Back pain, tinnitus   Zonegran [zonisamide]    Increased headache      Medication List       Accurate as of February 03, 2019 11:59 PM. If you have any questions, ask your nurse or doctor.        Aimovig 140 MG/ML So[place] Generic drug: Erenumab-aooe Inject 140 mg into the skin every 30 (thirty) days.   albuterol (2.5 MG/3ML) 0.083% nebulizer solution Commonly known as: PROVENTIL Take 3 mLs (2.5 mg total) by nebulization every 4 (four) hours as needed for wheezing or shortness of breath.   albuterol 108 (90 Base) MCG/ACT inhaler Commonly known as: VENTOLIN HFA Inhale 1-2 puffs into the lungs every 6 (six) hours as needed for wheezing or shortness of breath.   amLODipine 5 MG tablet Commonly known as: NORVASC Take 1 tablet (5 mg  total) by mouth daily.   budesonide-formoterol 160-4.5 MCG/ACT inhaler Commonly known as: Symbicort Inhale 2 puffs into the lungs 2 (two) times daily. What changed:   how much to take  how to take this  when to take this  additional instructions   glucose blood test strip Commonly known as: OneTouch Verio Use to check BG up to once daily   Janumet XR [phone] MG Tb24 Generic drug: SitaGLIPtin-MetFORMIN HCl Take 1 tablet by mouth daily.   Livalo 4 MG Tabs Generic drug: Pitavastatin Calcium Take 1 tablet (4 mg total) by mouth daily. What changed:  how much to take Changed by: Terald Sleeper, PA-C   nitroGLYCERIN 0.4 MG SL tablet Commonly known as: NITROSTAT Place 1 tablet (0.4 mg total) under the tongue every 5 (five) minutes as needed for chest pain. May repeat up to 3 doses.   OneTouch Delica Lancets 84T Misc Use to check BG up to once daily   Oxycodone HCl 10 MG Tabs Take 1-2 tablets (10-20 mg total) by mouth every 6 (six) hours.   venlafaxine XR 37.5 MG 24 hr capsule Commonly known as: Effexor XR 2 tablets daily for 2 weeks, then take 1 tablet daily for 2 weeks and then stop          Objective:    BP (!) 142/88   Pulse 85   Temp 98.6 F (37 C) (Oral)   Ht _0  (1.905 m)   Wt 294 lb 3.2 oz (133.4 kg)   BMI 36.77 kg/m   Allergies  Allergen Reactions  . Crestor [Rosuvastatin Calcium] Other (See Comments)    myalgias  . Depakote [Divalproex Sodium]     Stomach pains  . Lipitor [Atorvastatin] Other (See Comments)    Myalgias   . Meclizine     Increased dizziness  . Other Swelling    Mammelain meat allergy  . Simvastatin Other (See Comments)    Myalgias   . Topamax [Topiramate]     Back pain, tinnitus  . Zonegran [Zonisamide]     Increased headache    Wt Readings from Last 3 Encounters:  02/03/19 294 lb 3.2 oz (133.4 kg)  01/19/19 270 lb (122.5 kg)  08/05/18 276 lb 6.4 oz (125.4 kg)    Physical Exam Vitals signs and nursing note reviewed.  Constitutional:      General: He is not in acute distress.    Appearance: He is well-developed.  HENT:     Head: Normocephalic and atraumatic.  Eyes:     Conjunctiva/sclera: Conjunctivae normal.     Pupils: Pupils are equal, round, and reactive to light.  Cardiovascular:     Rate and Rhythm: Normal rate and regular rhythm.     Heart sounds: Normal heart sounds.  Pulmonary:     Effort: Pulmonary effort is normal. No respiratory distress.     Breath sounds: Normal breath sounds.  Skin:    General: Skin is warm and dry.  Psychiatric:        Behavior:  Behavior normal.     Results for orders placed or performed in visit on 02/03/19  Lipid panel  Result Value Ref Range   Cholesterol, Total 156 100 - 199 mg/dL   Triglycerides 130 0 - 149 mg/dL   HDL 36 (L) >39 mg/dL   VLDL Cholesterol Cal 26 5 - 40 mg/dL   LDL Calculated 94 0 - 99 mg/dL   Chol/HDL Ratio 4.3 0.0 - 5.0 ratio  CBC with Differential/Platelet  Result Value  Ref Range   WBC 10.3 3.4 - 10.8 x10E3/uL   RBC 5.28 4.14 - 5.80 x10E6/uL   Hemoglobin 16.1 13.0 - 17.7 g/dL   Hematocrit 49.6 37.5 - 51.0 %   MCV 94 79 - 97 fL   MCH 30.5 26.6 - 33.0 pg   MCHC 32.5 31.5 - 35.7 g/dL   RDW 14.0 11.6 - 15.4 %   Platelets 284 150 - 450 x10E3/uL   Neutrophils 62 Not Estab. %   Lymphs 22 Not Estab. %   Monocytes 12 Not Estab. %   Eos 2 Not Estab. %   Basos 1 Not Estab. %   Neutrophils Absolute 6.5 1.4 - 7.0 x10E3/uL   Lymphocytes Absolute 2.3 0.7 - 3.1 x10E3/uL   Monocytes Absolute 1.2 (H) 0.1 - 0.9 x10E3/uL   EOS (ABSOLUTE) 0.2 0.0 - 0.4 x10E3/uL   Basophils Absolute 0.1 0.0 - 0.2 x10E3/uL   Immature Granulocytes 1 Not Estab. %   Immature Grans (Abs) 0.1 0.0 - 0.1 x10E3/uL  CMP14+EGFR  Result Value Ref Range   Glucose 82 65 - 99 mg/dL   BUN 13 6 - 24 mg/dL   Creatinine, Ser 0.72 (L) 0.76 - 1.27 mg/dL   GFR calc non Af Amer 106 >59 mL/min/1.73   GFR calc Af Amer 122 >59 mL/min/1.73   BUN/Creatinine Ratio 18 9 - 20   Sodium 141 134 - 144 mmol/L   Potassium 4.4 3.5 - 5.2 mmol/L   Chloride 105 96 - 106 mmol/L   CO2 21 20 - 29 mmol/L   Calcium 10.4 (H) 8.7 - 10.2 mg/dL   Total Protein 6.7 6.0 - 8.5 g/dL   Albumin 4.5 3.8 - 4.9 g/dL   Globulin, Total 2.2 1.5 - 4.5 g/dL   Albumin/Globulin Ratio 2.0 1.2 - 2.2   Bilirubin Total 0.3 0.0 - 1.2 mg/dL   Alkaline Phosphatase 46 39 - 117 IU/L   AST 50 (H) 0 - 40 IU/L   ALT 77 (H) 0 - 44 IU/L  Bayer DCA Hb A1c Waived  Result Value Ref Range   HB A1C (BAYER DCA - WAIVED) 6.4 <7.0 %      Assessment & Plan:   1. Type 2 diabetes  mellitus without complication, without long-term current use of insulin (HCC) - SitaGLIPtin-MetFORMIN HCl (JANUMET XR) [phone] MG TB24; Take 1 tablet by mouth daily.  Dispense: 90 tablet; Refill: 3 - Lipid panel - CBC with Differential/Platelet - CMP14+EGFR - Bayer DCA Hb A1c Waived  2. CAD S/P percutaneous coronary angioplasty - Lipid panel - CBC with Differential/Platelet - CMP14+EGFR - Bayer DCA Hb A1c Waived  3. Chronic left-sided low back pain with left-sided sciatica - Oxycodone HCl 10 MG TABS; Take 1-2 tablets (10-20 mg total) by mouth every 6 (six) hours.  Dispense: 180 tablet; Refill: 0  4. Pure hypercholesterolemia - Pitavastatin Calcium (LIVALO) 4 MG TABS; Take 1 tablet (4 mg total) by mouth daily.  Dispense: 30 tablet; Refill: 0  5. Essential hypertension - amLODipine (NORVASC) 5 MG tablet; Take 1 tablet (5 mg total) by mouth daily.  Dispense: 90 tablet; Refill: 0   Continue all other maintenance medications as listed above.  Follow up plan: No follow-ups on file.  Educational handout given for [place] PA-C [place] [address]  [place], [place] [address] [phone]   02/04/2019, 9:12 PM

## 2019-02-05 MED ORDER — DIAZEPAM 10 MG PO TABS: ORAL_TABLET | ORAL | 1 refills | Status: DC

## 2019-02-05 NOTE — Telephone Encounter (Signed)
Patient states that he discussed with you about  refilling his diazepam

## 2019-02-05 NOTE — Addendum Note (Signed)
Addended by: Thana Ates on: 02/05/2019 02:04 PM   Modules accepted: Orders

## 2019-02-12 ENCOUNTER — Telehealth: Payer: Self-pay | Admitting: *Deleted

## 2019-02-12 ENCOUNTER — Encounter: Payer: Self-pay | Admitting: *Deleted

## 2019-02-12 DIAGNOSIS — E7800 Pure hypercholesterolemia, unspecified: Secondary | ICD-10-CM

## 2019-02-12 MED ORDER — EZETIMIBE 10 MG PO TABS: 10.0000 mg | ORAL_TABLET | Freq: Every day | ORAL | 3 refills | Status: DC

## 2019-02-12 NOTE — Telephone Encounter (Signed)
-----   Message from Lelon Perla, MD sent at 02/08/2019  1:31 PM EDT ----- Add zetia 10 mg daily; lipids and liver 8 weeks Kirk Ruths ----- Message ----- From: Cristopher Estimable, RN Sent: 02/08/2019   1:26 PM EDT To: Lelon Perla, MD  Lipid and liver available in chart from pcp

## 2019-02-12 NOTE — Telephone Encounter (Signed)
My chart message to patient with dr Jacalyn Lefevre recommendations. New script sent to the pharmacy and Lab orders mailed to the pt.

## 2019-02-16 ENCOUNTER — Telehealth: Payer: Self-pay | Admitting: Neurology

## 2019-02-16 MED ORDER — VENLAFAXINE HCL ER 37.5 MG PO CP24: 37.5000 mg | ORAL_CAPSULE | Freq: Every day | ORAL | 1 refills | Status: DC

## 2019-02-16 NOTE — Addendum Note (Signed)
Addended by: Kathrynn Ducking on: 02/16/2019 04:39 PM   Modules accepted: Orders

## 2019-02-16 NOTE — Telephone Encounter (Signed)
Pt states that he has weaned off of one of his headache pills and is not feeling as well as he used to. He would like to know if he can get back on the medication. Please advise.

## 2019-02-16 NOTE — Telephone Encounter (Signed)
I reached out to the pt. He states since tapering off the Effexor his dizziness has increased. Pt states he still has the 37.5 tablets on hand and wanted to know if Dr. Jannifer Franklin would agree with him starting the Effexor back to control dizziness?

## 2019-02-16 NOTE — Telephone Encounter (Signed)
I called the patient.  The dizziness worsened coming off of the Effexor, we will add back the 37.5 mg tablets, have him take 1 a day along with the Aimovig.

## 2019-02-17 ENCOUNTER — Telehealth: Payer: Self-pay | Admitting: Neurology

## 2019-02-17 NOTE — Telephone Encounter (Signed)
[email or web address] [place],  - [address] is asking for a call from RN to discuss pt's venlafaxine XR (EFFEXOR XR) 37.5 MG 24 hr capsule

## 2019-02-17 NOTE — Telephone Encounter (Signed)
I reached out to [facility] and spoke with the pharmacist. She wanted to confirm the current dosage of Venlafaxine. I was able to advise the pt is currently taking 1 37.5 tab daily. She verbalized understanding and had no further questions at this time.

## 2019-03-30 ENCOUNTER — Other Ambulatory Visit: Payer: Self-pay | Admitting: Physician Assistant

## 2019-03-30 DIAGNOSIS — J439 Emphysema, unspecified: Secondary | ICD-10-CM

## 2019-04-12 ENCOUNTER — Telehealth: Payer: Self-pay

## 2019-04-12 NOTE — Telephone Encounter (Signed)
PA for Aimovig done on cover my meds:  Your information has been submitted to [place]. [employer] [place] will review the request and fax you a determination directly, typically within 3 business days of your submission once all necessary information is received. If [employer] [place] has not responded in 3 business days or if you have any questions about your submission, contact [place] at [phone]

## 2019-04-13 NOTE — Telephone Encounter (Signed)
Approvedtoday  Effective from 04/12/2019 through 04/10/2020.

## 2019-04-17 IMAGING — MR MR HEAD W/O CM
6 of 10 series · 30 of 48 positions shown · non-contrast
Comparison: None.

CLINICAL DATA: Headaches and dizziness

EXAM:
MRI HEAD WITHOUT CONTRAST
TECHNIQUE: Multiplanar, multiecho pulse sequences of the brain and surrounding
structures were obtained without intravenous contrast.

[Series 3: DWI · axial · 3.0mm · 0.72mm/px · z∈[-104,+57]mm · 7 of 55 slices shown (1 of 4)]
[im 1/55]
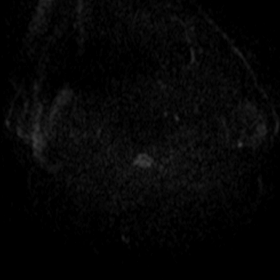
[im 10/55]
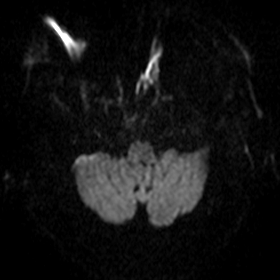
[im 19/55]
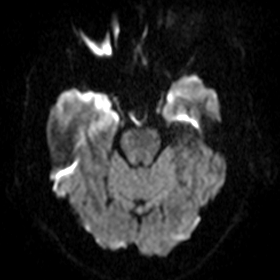
[im 28/55]
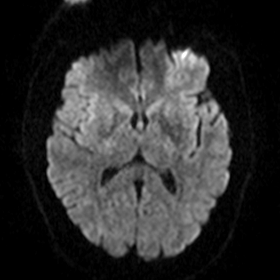
[im 37/55]
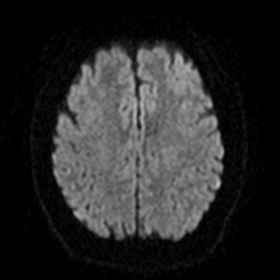
[im 46/55]
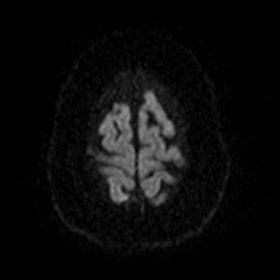
[im 55/55]
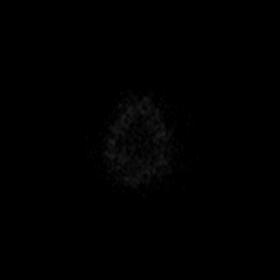

[Series 4: DWI · axial · 3.0mm · 0.72mm/px · z∈[-104,+57]mm · 6 of 55 slices shown (2 of 4)]
[im 1/55]
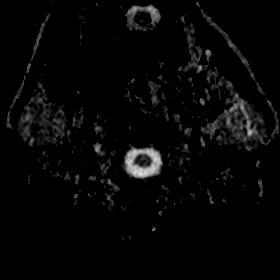
[im 11/55]
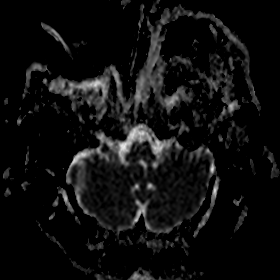
[im 22/55]
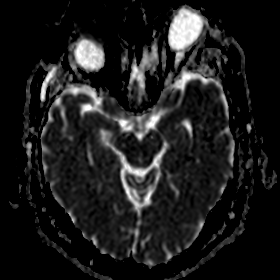
[im 33/55]
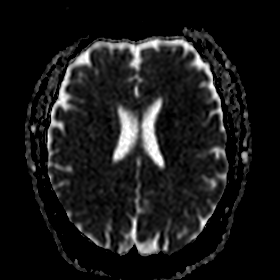
[im 44/55]
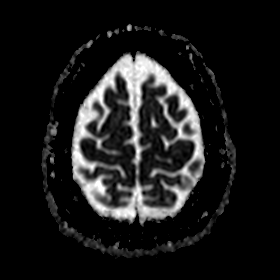
[im 55/55]
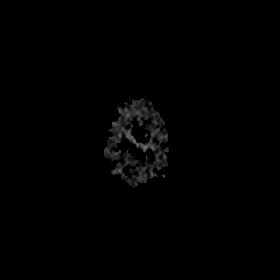

[Series 5: DWI · coronal · 5.0mm · 0.49mm/px · 4 of 38 slices shown (3 of 4)]
[im 1/38]
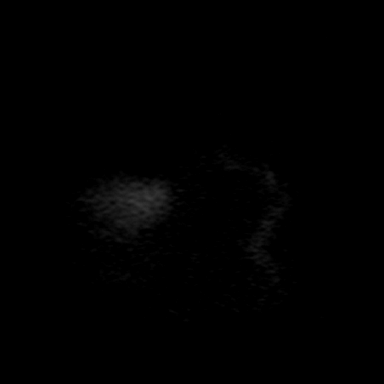
[im 13/38]
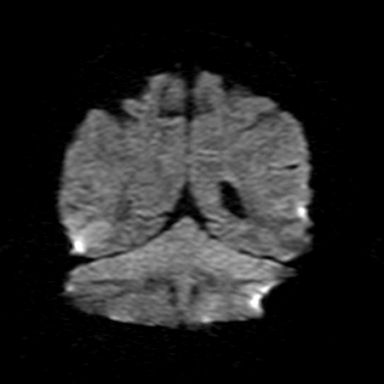
[im 25/38]
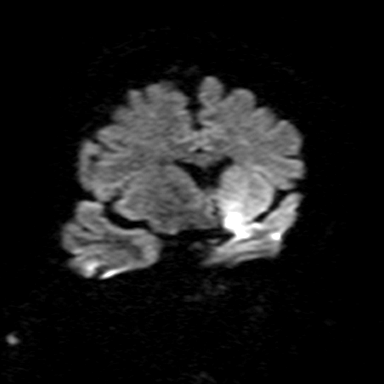
[im 38/38]
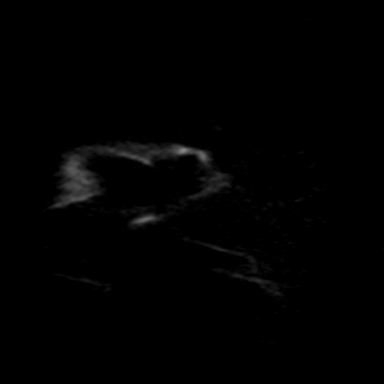

[Series 6: DWI · coronal · 5.0mm · 0.49mm/px · 4 of 38 slices shown (4 of 4)]
[im 1/38]
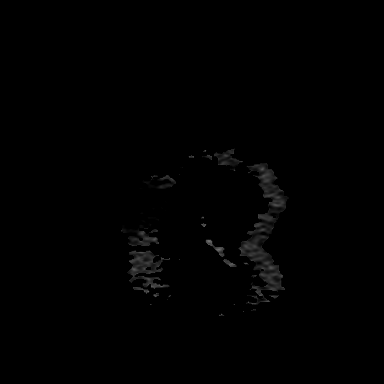
[im 13/38]
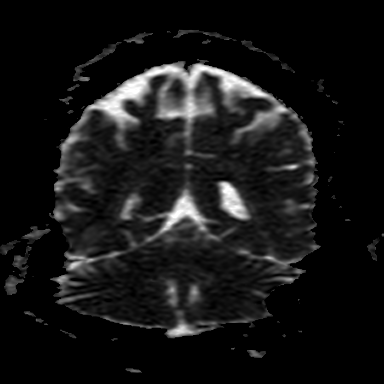
[im 25/38]
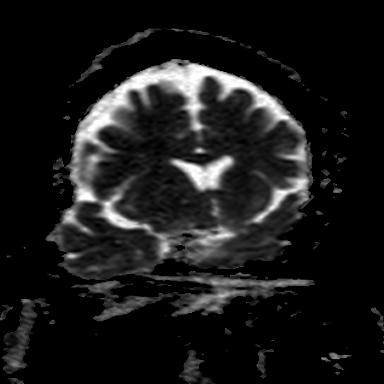
[im 38/38]
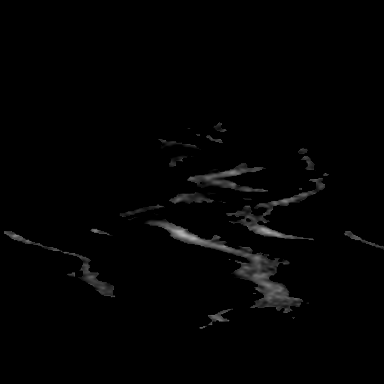

[Series 8: T2 · axial · 5.0mm · 0.58mm/px · z∈[-93,+50]mm · 3 of 23 slices shown]
[im 1/23]
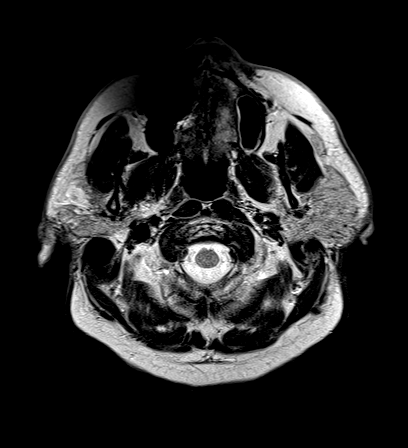
[im 12/23]
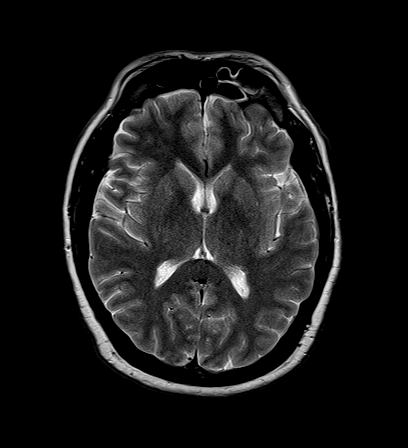
[im 23/23]
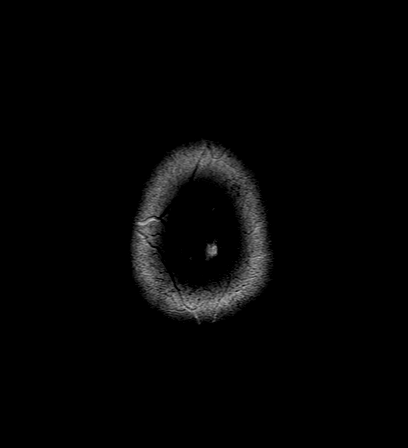

[Series 9: FLAIR · axial · 3.0mm · 0.39mm/px · z∈[-98,+55]mm · 6 of 52 slices shown]
[im 1/52]
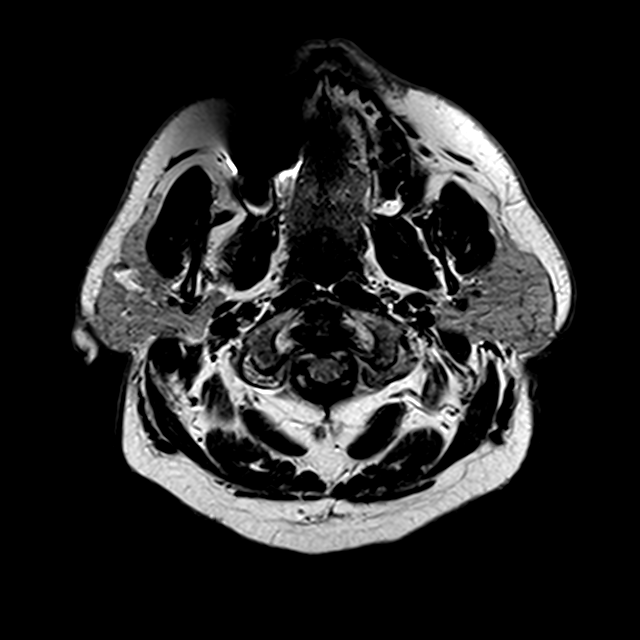
[im 11/52]
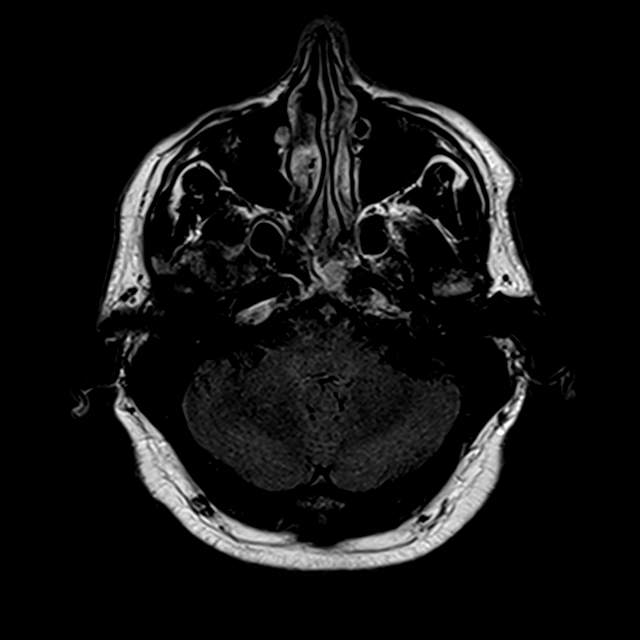
[im 21/52]
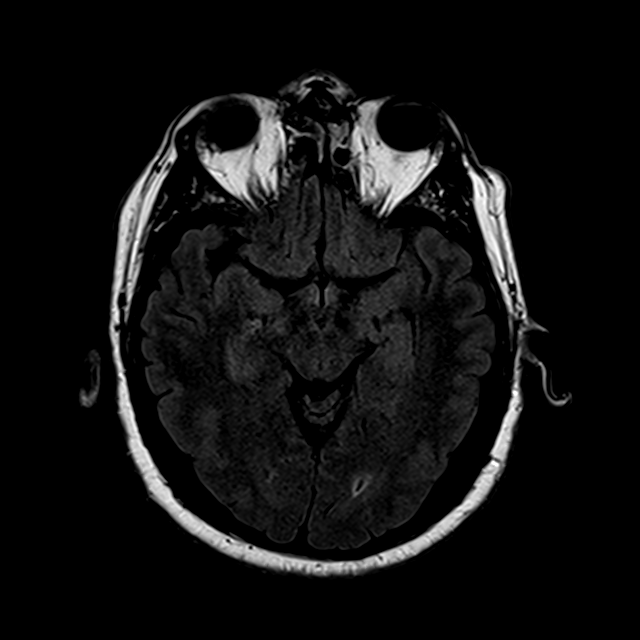
[im 31/52]
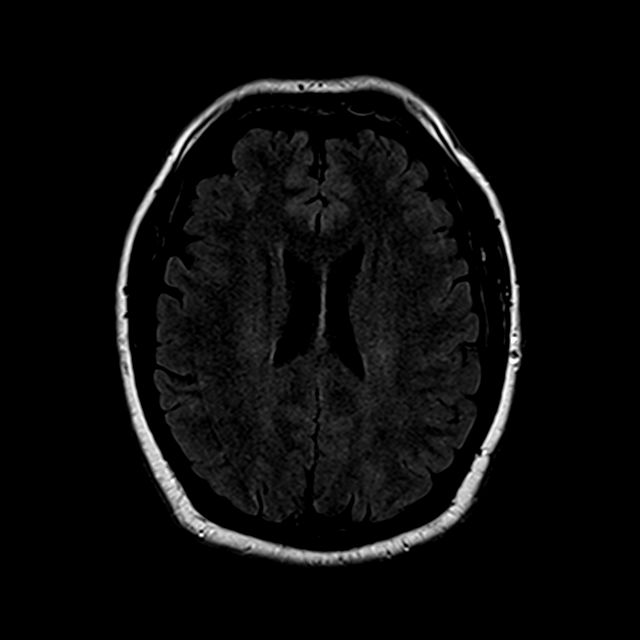
[im 41/52]
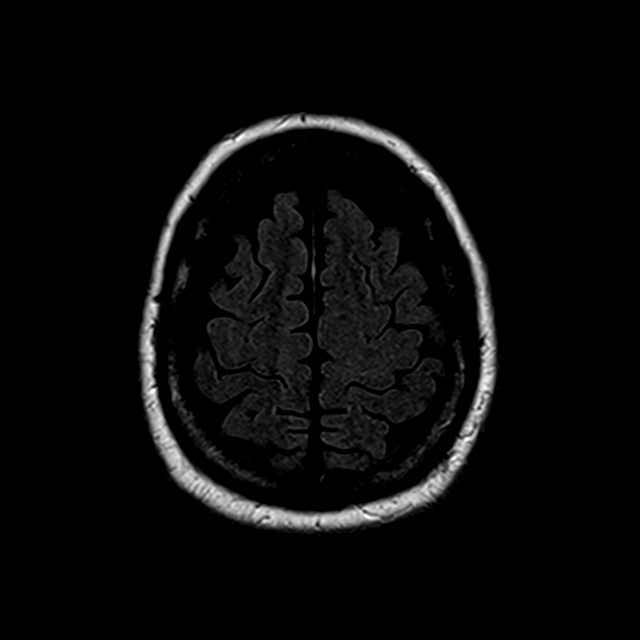
[im 52/52]
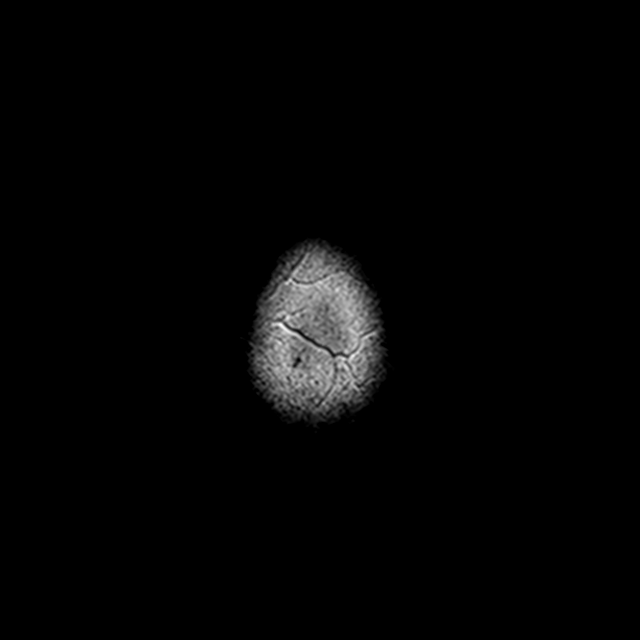

[30 of 48 positions shown; findings below may reference images not displayed]

FINDINGS: Brain: The midline structures are normal. There is no focal
diffusion restriction to indicate acute infarct. The brain
parenchymal signal is normal and there is no mass lesion. No
intraparenchymal hematoma or chronic microhemorrhage. Brain volume
is normal for age without lobar predominant atrophy. The dura is
normal and there is no extra-axial collection.

Vascular: Major intracranial arterial and venous sinus flow voids
are preserved.

Skull and upper cervical spine: The visualized skull base,
calvarium, upper cervical spine and extracranial soft tissues are
normal.

Sinuses/Orbits: Mild paranasal sinus mucosal thickening. No mastoid
or middle ear effusion. Normal orbits.
IMPRESSION: Normal MRI of the brain.

## 2019-04-22 ENCOUNTER — Ambulatory Visit: Payer: BC Managed Care – PPO | Admitting: Neurology

## 2019-04-22 ENCOUNTER — Encounter: Payer: Self-pay | Admitting: Neurology

## 2019-04-22 ENCOUNTER — Other Ambulatory Visit: Payer: Self-pay

## 2019-04-22 VITALS — BP 156/97 | HR 79 | Temp 97.5°F | Ht 74.0 in | Wt 283.0 lb

## 2019-04-22 DIAGNOSIS — G43019 Migraine without aura, intractable, without status migrainosus: Secondary | ICD-10-CM | POA: Diagnosis not present

## 2019-04-22 MED ORDER — GABAPENTIN 100 MG PO CAPS: 100.0000 mg | ORAL_CAPSULE | Freq: Three times a day (TID) | ORAL | 3 refills | Status: DC

## 2019-04-22 MED ORDER — UBRELVY 100 MG PO TABS: 100.0000 mg | ORAL_TABLET | Freq: Two times a day (BID) | ORAL | 3 refills | Status: DC | PRN

## 2019-04-22 NOTE — Progress Notes (Signed)
Reason for visit: Headache, dizziness  William Carson is an 53 y.o. male  History of present illness:  William Carson is a 53 year old right-handed white male with a history of chronic daily headaches and dizziness.  The patient tried to come off of the Effexor about 6 months ago but the dizziness significantly worsened, he has gone back on 37.5 mg daily.  He was started on Aimovig, he believes that this has been beneficial for the headaches and for the dizziness.  He is no longer experiencing any dizziness but he is still having some headaches but the frequency of the headaches have gone from daily to 2 or 3 times a week.  The patient indicates that the headaches oftentimes will come on in the afternoon, he in the last month has developed some neck discomfort and shoulder discomfort.  This was not present previously.  He denies any actual stiffness of the neck.  The patient is able to function through the headaches usually, if the headache gets bad he will take oxycodone.  He cannot take triptan medication secondary to prior history of coronary artery disease.  Past Medical History:  Diagnosis Date  . Adenomatous polyps   . Common migraine with intractable migraine 01/20/2019  . COPD (chronic obstructive pulmonary disease) ([place])   . Coronary artery disease   . Diabetes mellitus without complication ([place])   . Diverticulosis   . Hemorrhoids   . Hyperlipidemia   . Influenza A 10/20/2015  . Obesity     Past Surgical History:  Procedure Laterality Date  . ANTERIOR CRUCIATE LIGAMENT REPAIR     S/P  . CORONARY STENT PLACEMENT  2011   2 stents    Family History  Problem Relation Age of Onset  . Heart attack Mother        Infarction  . Arrhythmia Father        Atrial fibrillation  . Heart attack Maternal Grandfather   . Heart attack Paternal Grandfather     Social history:  reports that he quit smoking about 3 years ago. His smoking use included cigarettes. He has a 45.00 pack-year  smoking history. He has quit using smokeless tobacco. He reports current alcohol use of about 1.0 standard drinks of alcohol per week. He reports that he does not use drugs.    Allergies  Allergen Reactions  . Crestor [Rosuvastatin Calcium] Other (See Comments)    myalgias  . Depakote [Divalproex Sodium]     Stomach pains  . Lipitor [Atorvastatin] Other (See Comments)    Myalgias   . Meclizine     Increased dizziness  . Other Swelling    Mammelain meat allergy  . Simvastatin Other (See Comments)    Myalgias   . Topamax [Topiramate]     Back pain, tinnitus  . Zonegran [Zonisamide]     Increased headache    Medications:  Prior to Admission medications   Medication Sig Start Date End Date Taking? Authorizing Provider  albuterol (PROVENTIL) (2.5 MG/3ML) 0.083% nebulizer solution Take 3 mLs (2.5 mg total) by nebulization every 4 (four) hours as needed for wheezing or shortness of breath. 10/31/15   Timmothy Euler, MD  albuterol (VENTOLIN HFA) 108 (90 Base) MCG/ACT inhaler Inhale 1-2 puffs into the lungs every 6 (six) hours as needed for wheezing or shortness of breath. 02/03/19   Terald Sleeper, PA-C  amLODipine (NORVASC) 5 MG tablet Take 1 tablet (5 mg total) by mouth daily. 02/03/19 05/04/19  Terald Sleeper,  PA-C  budesonide-formoterol (SYMBICORT) 160-4.5 MCG/ACT inhaler Inhale 2 puffs into the lungs 2 (two) times daily. 03/30/19   Terald Sleeper, PA-C  diazepam (VALIUM) 10 MG tablet Take 1/2-1 every 8 hours as needed 02/05/19   Terald Sleeper, PA-C  Erenumab-aooe (AIMOVIG) 140 MG/ML SOAJ Inject 140 mg into the skin every 30 (thirty) days. 01/19/19   Kathrynn Ducking, MD  ezetimibe (ZETIA) 10 MG tablet Take 1 tablet (10 mg total) by mouth daily. 02/12/19 05/13/19  Lelon Perla, MD  glucose blood (ONETOUCH VERIO) test strip Use to check BG up to once daily 05/14/16   Cherre Robins, PharmD  nitroGLYCERIN (NITROSTAT) 0.4 MG SL tablet Place 1 tablet (0.4 mg total) under the tongue  every 5 (five) minutes as needed for chest pain. May repeat up to 3 doses. 08/05/18   Terald Sleeper, PA-C  ONETOUCH DELICA LANCETS 99991111 MISC Use to check BG up to once daily 05/14/16   Cherre Robins, PharmD  Oxycodone HCl 10 MG TABS Take 1-2 tablets (10-20 mg total) by mouth every 6 (six) hours. 02/03/19   Terald Sleeper, PA-C  Pitavastatin Calcium (LIVALO) 4 MG TABS Take 1 tablet (4 mg total) by mouth daily. 02/03/19   Terald Sleeper, PA-C  SitaGLIPtin-MetFORMIN HCl (JANUMET XR) [phone] MG TB24 Take 1 tablet by mouth daily. 02/03/19   Terald Sleeper, PA-C  venlafaxine XR (EFFEXOR XR) 37.5 MG 24 hr capsule Take 1 capsule (37.5 mg total) by mouth daily with breakfast. 2 tablets daily for 2 weeks, then take 1 tablet daily for 2 weeks and then stop 02/16/19   Kathrynn Ducking, MD    ROS:  Out of a complete 14 system review of symptoms, the patient complains only of the following symptoms, and all other reviewed systems are negative.  Headache Neck pain  Blood pressure (!) 156/97, pulse 79, temperature (!) 97.5 F (36.4 C), temperature source Temporal, height 6\' 2"  (1.88 m), weight 283 lb (128.4 kg).  Physical Exam  General: The patient is alert and cooperative at the time of the examination.  The patient is markedly obese.  Neuromuscular: The patient lacks only about 10 degrees of full lateral rotation cervical spine bilaterally.  Skin: No significant peripheral edema is noted.   Neurologic Exam  Mental status: The patient is alert and oriented x 3 at the time of the examination. The patient has apparent normal recent and remote memory, with an apparently normal attention span and concentration ability.   Cranial nerves: Facial symmetry is present. Speech is normal, no aphasia or dysarthria is noted. Extraocular movements are full. Visual fields are full.  Motor: The patient has good strength in all 4 extremities.  Sensory examination: Soft touch sensation is symmetric on the face,  arms, and legs.  Coordination: The patient has good finger-nose-finger and heel-to-shin bilaterally.  Gait and station: The patient has a normal gait. Tandem gait is normal. Romberg is negative. No drift is seen.  Reflexes: Deep tendon reflexes are symmetric.   Assessment/Plan:  1.  Chronic headache disorder  The patient has had some improvement in headache frequency and elimination of dizziness symptoms on Aimovig.  He recently has developed neck discomfort and shoulder pain.  We will add gabapentin taking 100 mg 3 times daily, he will call for any dose adjustments.  He may try to come off of Effexor, if the dizziness ensues he will get back on the medication.  He cannot take triptan medications secondary to the  history of coronary artery disease, I will write a prescription for Ubrelvy to take as needed, hopefully to reduce the amount of opiate medications he is taking.  He will follow-up in 4 months.  Jill Alexanders MD 04/22/2019 9:08 AM  [facility] [address] [place], [place] [address]  Phone [phone] Fax [phone]

## 2019-04-22 NOTE — Patient Instructions (Signed)
We will start gabapentin for the neck and head pain. Call for any dose adjustments.  Neurontin (gabapentin) may result in drowsiness, ankle swelling, gait instability, or possibly dizziness. Please contact our office if significant side effects occur with this medication.

## 2019-05-12 ENCOUNTER — Telehealth: Payer: Self-pay

## 2019-05-12 NOTE — Telephone Encounter (Signed)
PA for William Carson has been approved.   (KeyIV:[id number])  This request has received a Favorable outcome from [place].  Please keep in mind this is not a guarantee of payment. Eligibility and Benefit determinations will be made at the time of service.  Please note any additional information provided by [facility] at the bottom of the screen.  Pt is unable to take triptans due to history of coronary artery disease.  PA approval notice has been faxed to pt's pharm.

## 2019-07-05 ENCOUNTER — Other Ambulatory Visit: Payer: Self-pay | Admitting: Neurology

## 2019-07-05 ENCOUNTER — Other Ambulatory Visit: Payer: Self-pay | Admitting: Physician Assistant

## 2019-07-05 ENCOUNTER — Ambulatory Visit: Payer: Self-pay | Admitting: Neurology

## 2019-07-05 DIAGNOSIS — I10 Essential (primary) hypertension: Secondary | ICD-10-CM

## 2019-07-05 DIAGNOSIS — J439 Emphysema, unspecified: Secondary | ICD-10-CM

## 2019-07-26 ENCOUNTER — Telehealth: Payer: Self-pay | Admitting: Neurology

## 2019-07-26 NOTE — Telephone Encounter (Signed)
PA completed through cover my meds./ [employer] [id number] Should hear a response in 3 days

## 2019-07-27 NOTE — Telephone Encounter (Signed)
A copy of the approval letter has been faxed to the patients pharmacy on file. Confirmation fax has been received.

## 2019-07-27 NOTE — Telephone Encounter (Signed)
William Carson has been approved through 07-24-2020.

## 2019-07-29 ENCOUNTER — Other Ambulatory Visit: Payer: Self-pay | Admitting: Neurology

## 2019-08-05 ENCOUNTER — Other Ambulatory Visit: Payer: Self-pay

## 2019-08-06 ENCOUNTER — Ambulatory Visit (INDEPENDENT_AMBULATORY_CARE_PROVIDER_SITE_OTHER): Payer: BC Managed Care – PPO | Admitting: Physician Assistant

## 2019-08-06 ENCOUNTER — Encounter: Payer: Self-pay | Admitting: Physician Assistant

## 2019-08-06 ENCOUNTER — Other Ambulatory Visit: Payer: Self-pay

## 2019-08-06 VITALS — BP 138/87 | HR 89 | Temp 97.8°F | Ht 74.0 in | Wt 256.2 lb

## 2019-08-06 DIAGNOSIS — R42 Dizziness and giddiness: Secondary | ICD-10-CM | POA: Diagnosis not present

## 2019-08-06 DIAGNOSIS — E7800 Pure hypercholesterolemia, unspecified: Secondary | ICD-10-CM | POA: Diagnosis not present

## 2019-08-06 DIAGNOSIS — M5442 Lumbago with sciatica, left side: Secondary | ICD-10-CM

## 2019-08-06 DIAGNOSIS — I10 Essential (primary) hypertension: Secondary | ICD-10-CM

## 2019-08-06 DIAGNOSIS — G8929 Other chronic pain: Secondary | ICD-10-CM

## 2019-08-06 LAB — CBC WITH DIFFERENTIAL/PLATELET
Basophils Absolute: 0.1 10*3/uL (ref 0.0–0.2)
Basos: 1 %
EOS (ABSOLUTE): 0.2 10*3/uL (ref 0.0–0.4)
Eos: 2 %
Hematocrit: 51.4 % — ABNORMAL HIGH (ref 37.5–51.0)
Hemoglobin: 17.5 g/dL (ref 13.0–17.7)
Immature Grans (Abs): 0.1 10*3/uL (ref 0.0–0.1)
Immature Granulocytes: 1 %
Lymphocytes Absolute: 1.5 10*3/uL (ref 0.7–3.1)
Lymphs: 18 %
MCH: 30.2 pg (ref 26.6–33.0)
MCHC: 34 g/dL (ref 31.5–35.7)
MCV: 89 fL (ref 79–97)
Monocytes Absolute: 0.9 10*3/uL (ref 0.1–0.9)
Monocytes: 11 %
Neutrophils Absolute: 5.6 10*3/uL (ref 1.4–7.0)
Neutrophils: 67 %
Platelets: 288 10*3/uL (ref 150–450)
RBC: 5.79 x10E6/uL (ref 4.14–5.80)
RDW: 12.8 % (ref 11.6–15.4)
WBC: 8.3 10*3/uL (ref 3.4–10.8)

## 2019-08-06 LAB — BAYER DCA HB A1C WAIVED: HB A1C (BAYER DCA - WAIVED): 9.7 % — ABNORMAL HIGH (ref <7.0)

## 2019-08-06 MED ORDER — OXYCODONE HCL 10 MG PO TABS: 10.0000 mg | ORAL_TABLET | Freq: Four times a day (QID) | ORAL | 0 refills | Status: DC

## 2019-08-06 MED ORDER — DIAZEPAM 10 MG PO TABS: ORAL_TABLET | ORAL | 1 refills | Status: DC

## 2019-08-06 NOTE — Progress Notes (Signed)
Acute Office Visit  Subjective:    Patient ID: William Carson, male    DOB: 01/12/1966, 54 y.o.   MRN: [id number]  Chief Complaint  Patient presents with  . Medical Management of Chronic Issues    6 month     Hypertension This is a chronic problem. The current episode started more than 1 year ago. The problem is controlled. Pertinent negatives include no chest pain, headaches, palpitations or shortness of breath. Past treatments include calcium channel blockers. The current treatment provides significant improvement. There are no compliance problems.   Diabetes He presents for his follow-up diabetic visit. He has type 2 diabetes mellitus. His disease course has been stable. Pertinent negatives for hypoglycemia include no headaches. There are no diabetic associated symptoms. Pertinent negatives for diabetes include no chest pain, no fatigue and no weakness. There are no hypoglycemic complications. There are no diabetic complications. Risk factors for coronary artery disease include diabetes mellitus, dyslipidemia, male sex, hypertension and obesity. Meal planning includes avoidance of concentrated sweets. He has not had a previous visit with a dietitian.  Back Pain This is a chronic problem. The current episode started more than 1 year ago. The problem occurs intermittently. The problem has been waxing and waning since onset. The pain is present in the lumbar spine. Pertinent negatives include no abdominal pain, chest pain, headaches, numbness or weakness. He has tried muscle relaxant, analgesics and heat for the symptoms. The treatment provided mild relief.   The patient also still sees neurology for his migraines.  He has had excellent improvement being on the preventative Aimovig.  He rarely has to use an oxycodone.  He gets a prescription once or twice a year.  He still has a fair Monina's bottle.  We see him on a every 72-month basis.  Also he has vertigo symptoms that is treated with  Valium.  This also he uses on an intermittent basis.  The PDMP shows the fill record to be as he described.  He has had no high risk.  A new contract is performed today.  There is no need for drug screen because of his numbers.  Past Medical History:  Diagnosis Date  . Adenomatous polyps   . Common migraine with intractable migraine 01/20/2019  . COPD (chronic obstructive pulmonary disease) ([place])   . Coronary artery disease   . Diabetes mellitus without complication ([place])   . Diverticulosis   . Hemorrhoids   . Hyperlipidemia   . Influenza A 10/20/2015  . Obesity     Past Surgical History:  Procedure Laterality Date  . ANTERIOR CRUCIATE LIGAMENT REPAIR     S/P  . CORONARY STENT PLACEMENT  2011   2 stents    Family History  Problem Relation Age of Onset  . Heart attack Mother        Infarction  . Arrhythmia Father        Atrial fibrillation  . Heart attack Maternal Grandfather   . Heart attack Paternal Grandfather     Social History   Socioeconomic History  . Marital status: Married    Spouse name: Not on file  . Number of children: 0  . Years of education: 105  . Highest education level: Not on file  Occupational History  . Occupation: Armed forces technical officer: OTHER    Comment: [place], [place]  Tobacco Use  . Smoking status: Former Smoker    Packs/day: 1.50    Years: 30.00  Pack years: 45.00    Types: Cigarettes    Quit date: 10/16/2015    Years since quitting: 3.8  . Smokeless tobacco: Former Network engineer and Sexual Activity  . Alcohol use: Yes    Alcohol/week: 1.0 standard drinks    Types: 1 Standard drinks or equivalent per week  . Drug use: No  . Sexual activity: Yes  Other Topics Concern  . Not on file  Social History Narrative   Lives with wife   Right handed    Caffeine use: Coffee every Sunday   Diet-sun drop daily or coke zero   Has been married twice   Married to current wife for 3 years   No children   Social Determinants of  Health   Financial Resource Strain:   . Difficulty of Paying Living Expenses: Not on file  Food Insecurity:   . Worried About Charity fundraiser in the Last Year: Not on file  . Ran Out of Food in the Last Year: Not on file  Transportation Needs:   . Lack of Transportation (Medical): Not on file  . Lack of Transportation (Non-Medical): Not on file  Physical Activity:   . Days of Exercise per Week: Not on file  . Minutes of Exercise per Session: Not on file  Stress:   . Feeling of Stress : Not on file  Social Connections:   . Frequency of Communication with Friends and Family: Not on file  . Frequency of Social Gatherings with Friends and Family: Not on file  . Attends Religious Services: Not on file  . Active Member of Clubs or Organizations: Not on file  . Attends Archivist Meetings: Not on file  . Marital Status: Not on file  Intimate Partner Violence:   . Fear of Current or Ex-Partner: Not on file  . Emotionally Abused: Not on file  . Physically Abused: Not on file  . Sexually Abused: Not on file    Outpatient Medications Prior to Visit  Medication Sig Dispense Refill  . AIMOVIG 140 MG/ML SOAJ Inject 140 mg into the skin every 30 (thirty) days. 1 mL 5  . albuterol (PROVENTIL) (2.5 MG/3ML) 0.083% nebulizer solution Take 3 mLs (2.5 mg total) by nebulization every 4 (four) hours as needed for wheezing or shortness of breath. 150 mL 1  . albuterol (VENTOLIN HFA) 108 (90 Base) MCG/ACT inhaler Inhale 1-2 puffs into the lungs every 6 (six) hours as needed for wheezing or shortness of breath. 54 g 1  . amLODipine (NORVASC) 5 MG tablet Take 1 tablet (5 mg total) by mouth daily. 90 tablet 0  . budesonide-formoterol (SYMBICORT) 160-4.5 MCG/ACT inhaler Inhale 2 puffs into the lungs 2 (two) times daily. 10.2 g 0  . ezetimibe (ZETIA) 10 MG tablet Take 1 tablet (10 mg total) by mouth daily. 90 tablet 3  . gabapentin (NEURONTIN) 100 MG capsule Take 1 capsule (100 mg total) by  mouth 3 (three) times daily. 90 capsule 3  . glucose blood (ONETOUCH VERIO) test strip Use to check BG up to once daily 100 each 2  . nitroGLYCERIN (NITROSTAT) 0.4 MG SL tablet Place 1 tablet (0.4 mg total) under the tongue every 5 (five) minutes as needed for chest pain. May repeat up to 3 doses. 25 tablet 3  . ONETOUCH DELICA LANCETS 99991111 MISC Use to check BG up to once daily 100 each 2  . Pitavastatin Calcium (LIVALO) 4 MG TABS Take 1 tablet (4 mg total) by mouth  daily. 30 tablet 0  . SitaGLIPtin-MetFORMIN HCl (JANUMET XR) [phone] MG TB24 Take 1 tablet by mouth daily. 90 tablet 3  . Ubrogepant (UBRELVY) 100 MG TABS Take 100 mg by mouth 2 (two) times daily as needed. 10 tablet 3  . venlafaxine XR (EFFEXOR XR) 37.5 MG 24 hr capsule Take 1 capsule (37.5 mg total) by mouth daily with breakfast. 2 tablets daily for 2 weeks, then take 1 tablet daily for 2 weeks and then stop 90 capsule 1  . diazepam (VALIUM) 10 MG tablet Take 1/2-1 every 8 hours as needed 30 tablet 1  . Oxycodone HCl 10 MG TABS Take 1-2 tablets (10-20 mg total) by mouth every 6 (six) hours. 180 tablet 0   No facility-administered medications prior to visit.    Allergies  Allergen Reactions  . Crestor [Rosuvastatin Calcium] Other (See Comments)    myalgias  . Depakote [Divalproex Sodium]     Stomach pains  . Lipitor [Atorvastatin] Other (See Comments)    Myalgias   . Meclizine     Increased dizziness  . Other Swelling    Mammelain meat allergy  . Simvastatin Other (See Comments)    Myalgias   . Topamax [Topiramate]     Back pain, tinnitus  . Zonegran [Zonisamide]     Increased headache    Review of Systems  Constitutional: Negative.  Negative for appetite change and fatigue.  Eyes: Negative for pain and visual disturbance.  Respiratory: Negative.  Negative for cough, chest tightness, shortness of breath and wheezing.   Cardiovascular: Negative.  Negative for chest pain, palpitations and leg swelling.    Gastrointestinal: Negative.  Negative for abdominal pain, diarrhea, nausea and vomiting.  Genitourinary: Negative.   Musculoskeletal: Positive for back pain.  Skin: Negative.  Negative for color change and rash.  Neurological: Negative.  Negative for weakness, numbness and headaches.  Psychiatric/Behavioral: Negative.        Objective:    Physical Exam Vitals and nursing note reviewed.  Constitutional:      General: He is not in acute distress.    Appearance: He is well-developed.  HENT:     Head: Normocephalic and atraumatic.  Eyes:     Conjunctiva/sclera: Conjunctivae normal.     Pupils: Pupils are equal, round, and reactive to light.  Cardiovascular:     Rate and Rhythm: Normal rate and regular rhythm.     Heart sounds: Normal heart sounds.  Pulmonary:     Effort: Pulmonary effort is normal. No respiratory distress.     Breath sounds: Normal breath sounds.  Skin:    General: Skin is warm and dry.  Psychiatric:        Behavior: Behavior normal.     BP 138/87   Pulse 89   Temp 97.8 F (36.6 C) (Temporal)   Ht 6\' 2"  (1.88 m)   Wt 256 lb 3.2 oz (116.2 kg)   SpO2 96%   BMI 32.89 kg/m  Wt Readings from Last 3 Encounters:  08/06/19 256 lb 3.2 oz (116.2 kg)  04/22/19 283 lb (128.4 kg)  02/03/19 294 lb 3.2 oz (133.4 kg)    Health Maintenance Due  Topic Date Due  . OPHTHALMOLOGY EXAM  06/27/2018  . FOOT EXAM  07/24/2018  . URINE MICROALBUMIN  11/22/2018  . HEMOGLOBIN A1C  08/06/2019    There are no preventive care reminders to display for this patient.   Lab Results  Component Value Date   TSH 2.780 11/21/2017   Lab Results  Component Value Date   WBC 10.3 02/03/2019   HGB 16.1 02/03/2019   HCT 49.6 02/03/2019   MCV 94 02/03/2019   PLT 284 02/03/2019   Lab Results  Component Value Date   NA 141 02/03/2019   K 4.4 02/03/2019   CO2 21 02/03/2019   GLUCOSE 82 02/03/2019   BUN 13 02/03/2019   CREATININE 0.72 (L) 02/03/2019   BILITOT 0.3  02/03/2019   ALKPHOS 46 02/03/2019   AST 50 (H) 02/03/2019   ALT 77 (H) 02/03/2019   PROT 6.7 02/03/2019   ALBUMIN 4.5 02/03/2019   CALCIUM 10.4 (H) 02/03/2019   ANIONGAP 5 06/25/2018   GFR 111.52 02/28/2012   Lab Results  Component Value Date   CHOL 156 02/03/2019   Lab Results  Component Value Date   HDL 36 (L) 02/03/2019   Lab Results  Component Value Date   LDLCALC 94 02/03/2019   Lab Results  Component Value Date   TRIG 130 02/03/2019   Lab Results  Component Value Date   CHOLHDL 4.3 02/03/2019   Lab Results  Component Value Date   HGBA1C 6.4 02/03/2019       Assessment & Plan:   Problem List Items Addressed This Visit      Nervous and Auditory   Chronic left-sided low back pain with left-sided sciatica   Relevant Medications   diazepam (VALIUM) 10 MG tablet   Oxycodone HCl 10 MG TABS     Other   Hyperlipidemia   Relevant Orders   CBC with Differential/Platelet   hgba1c   Dizziness   Relevant Medications   diazepam (VALIUM) 10 MG tablet    Other Visit Diagnoses    Essential hypertension    -  Primary   Relevant Orders   CBC with Differential/Platelet   hgba1c       Meds ordered this encounter  Medications  . diazepam (VALIUM) 10 MG tablet    Sig: Take 1/2-1 every 8 hours as needed    Dispense:  30 tablet    Refill:  1    Order Specific Question:   Supervising Provider    Answer:   Janora Norlander [id number]  . Oxycodone HCl 10 MG TABS    Sig: Take 1-2 tablets (10-20 mg total) by mouth every 6 (six) hours.    Dispense:  60 tablet    Refill:  0    Order Specific Question:   Supervising Provider    Answer:   Janora Norlander [id number]     Terald Sleeper, PA-C

## 2019-08-07 LAB — LIPID PANEL
Chol/HDL Ratio: 7.3 ratio — ABNORMAL HIGH (ref 0.0–5.0)
Cholesterol, Total: 198 mg/dL (ref 100–199)
HDL: 27 mg/dL — ABNORMAL LOW (ref >39)
LDL Chol Calc (NIH): 147 mg/dL — ABNORMAL HIGH (ref 0–99)
Triglycerides: 132 mg/dL (ref 0–149)
VLDL Cholesterol Cal: 24 mg/dL (ref 5–40)

## 2019-08-07 LAB — HEPATIC FUNCTION PANEL
ALT: 77 IU/L — ABNORMAL HIGH (ref 0–44)
AST: 42 IU/L — ABNORMAL HIGH (ref 0–40)
Albumin: 4.4 g/dL (ref 3.8–4.9)
Alkaline Phosphatase: 64 IU/L (ref 39–117)
Bilirubin Total: 0.5 mg/dL (ref 0.0–1.2)
Bilirubin, Direct: 0.16 mg/dL (ref 0.00–0.40)
Total Protein: 6.5 g/dL (ref 6.0–8.5)

## 2019-08-10 ENCOUNTER — Encounter: Payer: Self-pay | Admitting: *Deleted

## 2019-08-10 NOTE — Telephone Encounter (Signed)
-----   Message from Lelon Perla, MD sent at 08/09/2019  7:12 PM EST ----- Add zetia 10 mg daily; lipids and liver 12 weeks [facility]

## 2019-08-17 NOTE — Telephone Encounter (Signed)
This encounter was created in error - please disregard.

## 2019-08-26 ENCOUNTER — Ambulatory Visit: Payer: BC Managed Care – PPO | Admitting: Neurology

## 2019-09-10 ENCOUNTER — Other Ambulatory Visit: Payer: Self-pay | Admitting: Neurology

## 2019-09-10 ENCOUNTER — Other Ambulatory Visit: Payer: Self-pay | Admitting: Physician Assistant

## 2019-09-10 DIAGNOSIS — J439 Emphysema, unspecified: Secondary | ICD-10-CM

## 2019-10-04 ENCOUNTER — Other Ambulatory Visit: Payer: Self-pay | Admitting: Physician Assistant

## 2019-10-04 DIAGNOSIS — I10 Essential (primary) hypertension: Secondary | ICD-10-CM

## 2019-11-12 ENCOUNTER — Telehealth (INDEPENDENT_AMBULATORY_CARE_PROVIDER_SITE_OTHER): Payer: BC Managed Care – PPO | Admitting: Family Medicine

## 2019-11-12 ENCOUNTER — Encounter: Payer: Self-pay | Admitting: Family Medicine

## 2019-11-12 DIAGNOSIS — L089 Local infection of the skin and subcutaneous tissue, unspecified: Secondary | ICD-10-CM | POA: Diagnosis not present

## 2019-11-12 MED ORDER — SULFAMETHOXAZOLE-TRIMETHOPRIM 800-160 MG PO TABS: 1.0000 | ORAL_TABLET | Freq: Two times a day (BID) | ORAL | 0 refills | Status: AC

## 2019-11-12 NOTE — Progress Notes (Signed)
Virtual Visit via Video note  I connected with William Carson on 11/12/19 at 4:31 PM by video and verified that I am speaking with the correct person using two identifiers. William Carson is currently located at home and his wife is currently with him during visit. The provider, Loman Brooklyn, FNP is located in their home at time of visit.  I discussed the limitations, risks, security and privacy concerns of performing an evaluation and management service by video and the availability of in person appointments. I also discussed with the patient that there may be a patient responsible charge related to this service. The patient expressed understanding and agreed to proceed.  Subjective: PCP: Terald Sleeper, PA-C  Chief Complaint  Patient presents with  . Cellulitis   Patient reports he tore the skin off his right hand in multiple places on Wednesday when he was working on his tractor. This morning when he woke up his hand was red, sore, and swollen. It improved some when he soaked it warm salt water.    ROS: Per HPI  Current Outpatient Medications:  .  AIMOVIG 140 MG/ML SOAJ, Inject 140 mg into the skin every 30 (thirty) days., Disp: 1 mL, Rfl: 5 .  albuterol (PROVENTIL) (2.5 MG/3ML) 0.083% nebulizer solution, Take 3 mLs (2.5 mg total) by nebulization every 4 (four) hours as needed for wheezing or shortness of breath., Disp: 150 mL, Rfl: 1 .  albuterol (VENTOLIN HFA) 108 (90 Base) MCG/ACT inhaler, Inhale 1-2 puffs into the lungs every 6 (six) hours as needed for wheezing or shortness of breath., Disp: 54 g, Rfl: 1 .  amLODipine (NORVASC) 5 MG tablet, Take 1 tablet (5 mg total) by mouth daily., Disp: 90 tablet, Rfl: 0 .  budesonide-formoterol (SYMBICORT) 160-4.5 MCG/ACT inhaler, Inhale 2 puffs into the lungs 2 (two) times daily., Disp: 10.2 g, Rfl: 0 .  diazepam (VALIUM) 10 MG tablet, Take 1/2-1 every 8 hours as needed, Disp: 30 tablet, Rfl: 1 .  ezetimibe (ZETIA) 10 MG tablet, Take 1  tablet (10 mg total) by mouth daily., Disp: 90 tablet, Rfl: 3 .  gabapentin (NEURONTIN) 100 MG capsule, Take 1 capsule (100 mg total) by mouth 3 (three) times daily., Disp: 90 capsule, Rfl: 3 .  glucose blood (ONETOUCH VERIO) test strip, Use to check BG up to once daily, Disp: 100 each, Rfl: 2 .  nitroGLYCERIN (NITROSTAT) 0.4 MG SL tablet, Place 1 tablet (0.4 mg total) under the tongue every 5 (five) minutes as needed for chest pain. May repeat up to 3 doses., Disp: 25 tablet, Rfl: 3 .  ONETOUCH DELICA LANCETS [id number] MISC, Use to check BG up to once daily, Disp: 100 each, Rfl: 2 .  Oxycodone HCl 10 MG TABS, Take 1-2 tablets (10-20 mg total) by mouth every 6 (six) hours., Disp: 60 tablet, Rfl: 0 .  Pitavastatin Calcium (LIVALO) 4 MG TABS, Take 1 tablet (4 mg total) by mouth daily., Disp: 30 tablet, Rfl: 0 .  SitaGLIPtin-MetFORMIN HCl (JANUMET XR) [phone] MG TB24, Take 1 tablet by mouth daily., Disp: 90 tablet, Rfl: 3 .  sulfamethoxazole-trimethoprim (BACTRIM DS) 800-160 MG tablet, Take 1 tablet by mouth 2 (two) times daily for 7 days., Disp: 14 tablet, Rfl: 0 .  Ubrogepant (UBRELVY) 100 MG TABS, Take 100 mg by mouth 2 (two) times daily as needed., Disp: 10 tablet, Rfl: 3 .  venlafaxine XR (EFFEXOR-XR) 37.5 MG 24 hr capsule, Take 1 capsule (37.5 mg total) daily with breakfast., Disp:  90 capsule, Rfl: 0  Allergies  Allergen Reactions  . Crestor [Rosuvastatin Calcium] Other (See Comments)    myalgias  . Depakote [Divalproex Sodium]     Stomach pains  . Lipitor [Atorvastatin] Other (See Comments)    Myalgias   . Meclizine     Increased dizziness  . Other Swelling    Mammelain meat allergy  . Simvastatin Other (See Comments)    Myalgias   . Topamax [Topiramate]     Back pain, tinnitus  . Zonegran [Zonisamide]     Increased headache   Past Medical History:  Diagnosis Date  . Adenomatous polyps   . Common migraine with intractable migraine 01/20/2019  . COPD (chronic obstructive pulmonary  disease) ([place])   . Coronary artery disease   . Diabetes mellitus without complication ([place])   . Diverticulosis   . Hemorrhoids   . Hyperlipidemia   . Influenza A 10/20/2015  . Obesity     Observations/Objective: Physical Exam Constitutional:      General: He is not in acute distress.    Appearance: Normal appearance. He is not ill-appearing or toxic-appearing.  Eyes:     General: No scleral icterus.       Right eye: No discharge.        Left eye: No discharge.     Conjunctiva/sclera: Conjunctivae normal.  Pulmonary:     Effort: Pulmonary effort is normal. No respiratory distress.  Skin:    Findings: Wound (5 areas on right hand where he knocked skin off with mild erythema surrounding) present.  Neurological:     Mental Status: He is alert and oriented to person, place, and time.  Psychiatric:        Mood and Affect: Mood normal.        Behavior: Behavior normal.        Thought Content: Thought content normal.        Judgment: Judgment normal.    Assessment and Plan: 1. Skin infection - Encouraged to cleanse daily with normal saline. Keep covered if he is going to be outside working where it can get dirty.  - sulfamethoxazole-trimethoprim (BACTRIM DS) 800-160 MG tablet; Take 1 tablet by mouth 2 (two) times daily for 7 days.  Dispense: 14 tablet; Refill: 0   Follow Up Instructions:   I discussed the assessment and treatment plan with the patient. The patient was provided an opportunity to ask questions and all were answered. The patient agreed with the plan and demonstrated an understanding of the instructions.   The patient was advised to call back or seek an in-person evaluation if the symptoms worsen or if the condition fails to improve as anticipated.  The above assessment and management plan was discussed with the patient. The patient verbalized understanding of and has agreed to the management plan. Patient is aware to call the clinic if symptoms persist or worsen.  Patient is aware when to return to the clinic for a follow-up visit. Patient educated on when it is appropriate to go to the emergency department.   Time call ended: 4:40 PM  I provided 11 minutes of face-to-face time during this encounter.   Hendricks Limes, MSN, APRN, FNP-C [facility] 11/12/19

## 2019-12-13 ENCOUNTER — Other Ambulatory Visit: Payer: Self-pay | Admitting: Neurology

## 2019-12-14 ENCOUNTER — Other Ambulatory Visit: Payer: Self-pay | Admitting: *Deleted

## 2019-12-14 DIAGNOSIS — I10 Essential (primary) hypertension: Secondary | ICD-10-CM

## 2019-12-14 DIAGNOSIS — J439 Emphysema, unspecified: Secondary | ICD-10-CM

## 2019-12-14 MED ORDER — AMLODIPINE BESYLATE 5 MG PO TABS: 5.0000 mg | ORAL_TABLET | Freq: Every day | ORAL | 0 refills | Status: DC

## 2019-12-14 MED ORDER — BUDESONIDE-FORMOTEROL FUMARATE 160-4.5 MCG/ACT IN AERO: INHALATION_SPRAY | RESPIRATORY_TRACT | 0 refills | Status: DC

## 2019-12-15 ENCOUNTER — Other Ambulatory Visit: Payer: Self-pay

## 2019-12-15 MED ORDER — VENLAFAXINE HCL ER 37.5 MG PO CP24: ORAL_CAPSULE | ORAL | 1 refills | Status: DC

## 2020-01-19 ENCOUNTER — Telehealth (INDEPENDENT_AMBULATORY_CARE_PROVIDER_SITE_OTHER): Payer: BC Managed Care – PPO | Admitting: Cardiology

## 2020-01-19 ENCOUNTER — Encounter: Payer: Self-pay | Admitting: Cardiology

## 2020-01-19 VITALS — BP 133/80 | HR 75 | Ht 74.0 in | Wt 244.0 lb

## 2020-01-19 DIAGNOSIS — I10 Essential (primary) hypertension: Secondary | ICD-10-CM | POA: Insufficient documentation

## 2020-01-19 DIAGNOSIS — E7800 Pure hypercholesterolemia, unspecified: Secondary | ICD-10-CM | POA: Diagnosis not present

## 2020-01-19 DIAGNOSIS — E119 Type 2 diabetes mellitus without complications: Secondary | ICD-10-CM

## 2020-01-19 DIAGNOSIS — I2510 Atherosclerotic heart disease of native coronary artery without angina pectoris: Secondary | ICD-10-CM

## 2020-01-19 DIAGNOSIS — J438 Other emphysema: Secondary | ICD-10-CM

## 2020-01-19 DIAGNOSIS — Z9861 Coronary angioplasty status: Secondary | ICD-10-CM

## 2020-01-19 NOTE — Addendum Note (Signed)
Addended by: Therisa Doyne on: 01/19/2020 10:02 AM   Modules accepted: Orders

## 2020-01-19 NOTE — Patient Instructions (Signed)
Medication Instructions:  Your physician recommends that you continue on your current medications as directed. Please refer to the Current Medication list given to you today.  *If you need a refill on your cardiac medications before your next appointment, please call your pharmacy*    Follow-Up: At [facility], you and your health needs are our priority.  As part of our continuing mission to provide you with exceptional heart care, we have created designated Provider Care Teams.  These Care Teams include your primary Cardiologist (physician) and Advanced Practice Providers (APPs -  Physician Assistants and Nurse Practitioners) who all work together to provide you with the care you need, when you need it.  We recommend signing up for the patient portal called "MyChart".  Sign up information is provided on this After Visit Summary.  MyChart is used to connect with patients for Virtual Visits (Telemedicine).  Patients are able to view lab/test results, encounter notes, upcoming appointments, etc.  Non-urgent messages can be sent to your provider as well.   To learn more about what you can do with MyChart, go to [email or web address].    Your next appointment:   6 month(s)  The format for your next appointment:   In Person  Provider:   You may see Kirk Ruths, MD or one of the following Advanced Practice Providers on your designated Care Team:    Kerin Ransom, PA-C  [place], [place]  Coletta Memos, FNP    Other Instructions You have been referred to our [facility] here in our office. They will call you to schedule your appointment.  Please call our office 2 months in advance to schedule your follow-up appointment with Dr. Stanford Breed.

## 2020-01-19 NOTE — Progress Notes (Signed)
Virtual Visit via Telephone Note   This visit type was conducted due to national recommendations for restrictions regarding the COVID-19 Pandemic (e.g. social distancing) in an effort to limit this patient's exposure and mitigate transmission in our community.  Due to his co-morbid illnesses, this patient is at least at moderate risk for complications without adequate follow up.  This format is felt to be most appropriate for this patient at this time.  The patient did not have access to video technology/had technical difficulties with video requiring transitioning to audio format only (telephone).  All issues noted in this document were discussed and addressed.  No physical exam could be performed with this format.  Please refer to the patient's chart for his  consent to telehealth for [facility].   The patient was identified using 2 identifiers.  Date:  01/19/2020   ID:  William Carson, DOB 11-11-1965, MRN [id number]  Patient Location: Home Provider Location: Home  PCP:  Terald Sleeper, PA-C  Cardiologist:  Dr Stanford Breed Electrophysiologist:  None   Evaluation Performed:  Follow-Up Visit  Chief Complaint:  none  History of Present Illness:    [name] is a 54 y.o. 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  He has a hx of coronary disease.  In 2011 he had an NSTEMI treated with RCA DES x2.  In 2018 he had a negative treadmill, he has not had issues with chest pain since.  Other medical issues include essential hypertension, non-insulin-dependent diabetes, COPD and a past history of smoking, and dyslipidemia with statin intolerance.  He was contacted today for routine follow-up.  Since we saw him last he has done well, he denies any chest pain.  He had been on Livalo for dyslipidemia but could not tolerate it.  He tried taking it every other day but says he "could not move".  Lipid panel done in January 2021 showed his LDL to be 147.  The patient does not have symptoms  concerning for COVID-19 infection (fever, chills, cough, or new shortness of breath).    Past Medical History:  Diagnosis Date  . Adenomatous polyps   . Common migraine with intractable migraine 01/20/2019  . COPD (chronic obstructive pulmonary disease) ([place])   . Coronary artery disease   . Diabetes mellitus without complication ([place])   . Diverticulosis   . Hemorrhoids   . Hyperlipidemia   . Influenza A 10/20/2015  . Obesity    Past Surgical History:  Procedure Laterality Date  . ANTERIOR CRUCIATE LIGAMENT REPAIR     S/P  . CORONARY STENT PLACEMENT  2011   2 stents     Current Meds  Medication Sig  . AIMOVIG 140 MG/ML SOAJ Inject 140 mg into the skin every 30 (thirty) days.  Marland Kitchen albuterol (PROVENTIL) (2.5 MG/3ML) 0.083% nebulizer solution Take 3 mLs (2.5 mg total) by nebulization every 4 (four) hours as needed for wheezing or shortness of breath.  Marland Kitchen albuterol (VENTOLIN HFA) 108 (90 Base) MCG/ACT inhaler Inhale 1-2 puffs into the lungs every 6 (six) hours as needed for wheezing or shortness of breath.  Marland Kitchen amLODipine (NORVASC) 5 MG tablet Take 1 tablet (5 mg total) by mouth daily.  . budesonide-formoterol (SYMBICORT) 160-4.5 MCG/ACT inhaler Inhale 2 puffs into the lungs 2 (two) times daily.  . diazepam (VALIUM) 10 MG tablet Take 1/2-1 every 8 hours as needed  . gabapentin (NEURONTIN) 100 MG capsule Take 1 capsule (100 mg total) by mouth 3 (three) times daily.  Marland Kitchen  glucose blood (ONETOUCH VERIO) test strip Use to check BG up to once daily  . nitroGLYCERIN (NITROSTAT) 0.4 MG SL tablet Place 1 tablet (0.4 mg total) under the tongue every 5 (five) minutes as needed for chest pain. May repeat up to 3 doses.  Glory Rosebush DELICA LANCETS 01B MISC Use to check BG up to once daily  . Oxycodone HCl 10 MG TABS Take 1-2 tablets (10-20 mg total) by mouth every 6 (six) hours.  . Pitavastatin Calcium (LIVALO) 4 MG TABS Take 1 tablet (4 mg total) by mouth daily.  . SitaGLIPtin-MetFORMIN HCl (JANUMET XR)  [phone] MG TB24 Take 1 tablet by mouth daily.  Marland Kitchen Ubrogepant (UBRELVY) 100 MG TABS Take 100 mg by mouth 2 (two) times daily as needed.  . venlafaxine XR (EFFEXOR-XR) 37.5 MG 24 hr capsule Take 1 capsule (37.5 mg total) daily with breakfast.     Allergies:   Crestor [rosuvastatin calcium], Depakote [divalproex sodium], Lipitor [atorvastatin], Meclizine, Other, Simvastatin, Topamax [topiramate], and Zonegran [zonisamide]   Social History   Tobacco Use  . Smoking status: Former Smoker    Packs/day: 1.50    Years: 30.00    Pack years: 45.00    Types: Cigarettes    Quit date: 10/16/2015    Years since quitting: 4.2  . Smokeless tobacco: Former Network engineer  . Vaping Use: Never used  Substance Use Topics  . Alcohol use: Yes    Alcohol/week: 1.0 standard drink    Types: 1 Standard drinks or equivalent per week  . Drug use: No     Family Hx: The patient's family history includes Arrhythmia in his father; Heart attack in his maternal grandfather, mother, and paternal grandfather.  ROS:   Please see the history of present illness.    He has a "cold" currently All other systems reviewed and are negative.   Prior CV studies:   The following studies were reviewed today: GXT November 06, 2016- Carotid dopplers 11/06/16-   Labs/Other Tests and Data Reviewed:    EKG:  An ECG dated 2017-11-06 was personally reviewed today and demonstrated:  NSR-HR 61  Recent Labs: 02/03/2019: BUN 13; Creatinine, Ser 0.72; Potassium 4.4; Sodium 141 08/06/2019: ALT 77; Hemoglobin 17.5; Platelets 288   Recent Lipid Panel Lab Results  Component Value Date/Time   CHOL 198 08/06/2019 08:27 AM   TRIG 132 08/06/2019 08:27 AM   HDL 27 (L) 08/06/2019 08:27 AM   CHOLHDL 7.3 (H) 08/06/2019 08:27 AM   CHOLHDL 5 02/28/2012 10:10 AM   LDLCALC 147 (H) 08/06/2019 08:27 AM    Wt Readings from Last 3 Encounters:  01/19/20 244 lb (110.7 kg)  08/06/19 256 lb 3.2 oz (116.2 kg)  04/22/19 283 lb (128.4 kg)     Objective:     Vital Signs:  BP 133/80   Pulse 75   Ht 6\' 2"  (1.88 m)   Wt 244 lb (110.7 kg) Comment: weighed 2 days ago  BMI 31.33 kg/m    VITAL SIGNS:  reviewed  ASSESSMENT & PLAN:    CAD- S/P RCA PCI with DES x 2 in 11-06-09- negative GXT 06-Nov-2016.  NO recent angina  HLD- Uncontrolled- statin intolerant- recent LDL 147.  Refer for PCSK9.  NIDDM- Per PCP  COPD- H/O smoking- not currently  HTN- Controlled  H/O migraines- Followed by Dr Jannifer Franklin  Plan: Refer to pharmacy for PCSK9.  F/U Dr Stanford Breed in 6 months in the office.   COVID-19 Education: The signs and symptoms of COVID-19 were discussed  with the patient and how to seek care for testing (follow up with PCP or arrange E-visit).  The importance of social distancing was discussed today.  Time:   Today, I have spent 15 minutes with the patient with telehealth technology discussing the above problems.     Medication Adjustments/Labs and Tests Ordered: Current medicines are reviewed at length with the patient today.  Concerns regarding medicines are outlined above.   Tests Ordered: No orders of the defined types were placed in this encounter.   Medication Changes: No orders of the defined types were placed in this encounter.   Follow Up:  In Person with Dr Stanford Breed in 6 months.   Signed, Kerin Ransom, PA-C  01/19/2020 8:35 AM    [facility]

## 2020-02-01 DIAGNOSIS — M6240 Contracture of muscle, unspecified site: Secondary | ICD-10-CM | POA: Diagnosis not present

## 2020-02-01 DIAGNOSIS — M9902 Segmental and somatic dysfunction of thoracic region: Secondary | ICD-10-CM | POA: Diagnosis not present

## 2020-02-01 DIAGNOSIS — R519 Headache, unspecified: Secondary | ICD-10-CM | POA: Diagnosis not present

## 2020-02-01 DIAGNOSIS — M9901 Segmental and somatic dysfunction of cervical region: Secondary | ICD-10-CM | POA: Diagnosis not present

## 2020-02-03 ENCOUNTER — Ambulatory Visit (INDEPENDENT_AMBULATORY_CARE_PROVIDER_SITE_OTHER): Payer: BC Managed Care – PPO | Admitting: Family

## 2020-02-03 ENCOUNTER — Other Ambulatory Visit: Payer: Self-pay

## 2020-02-03 ENCOUNTER — Ambulatory Visit: Payer: BC Managed Care – PPO | Admitting: Physician Assistant

## 2020-02-03 ENCOUNTER — Encounter: Payer: Self-pay | Admitting: Family

## 2020-02-03 VITALS — BP 129/78 | HR 97 | Temp 98.3°F | Ht 74.0 in | Wt 246.6 lb

## 2020-02-03 DIAGNOSIS — Z1159 Encounter for screening for other viral diseases: Secondary | ICD-10-CM

## 2020-02-03 DIAGNOSIS — E7800 Pure hypercholesterolemia, unspecified: Secondary | ICD-10-CM

## 2020-02-03 DIAGNOSIS — M5442 Lumbago with sciatica, left side: Secondary | ICD-10-CM | POA: Diagnosis not present

## 2020-02-03 DIAGNOSIS — J439 Emphysema, unspecified: Secondary | ICD-10-CM | POA: Diagnosis not present

## 2020-02-03 DIAGNOSIS — R42 Dizziness and giddiness: Secondary | ICD-10-CM

## 2020-02-03 DIAGNOSIS — G43019 Migraine without aura, intractable, without status migrainosus: Secondary | ICD-10-CM

## 2020-02-03 DIAGNOSIS — I10 Essential (primary) hypertension: Secondary | ICD-10-CM

## 2020-02-03 DIAGNOSIS — I2510 Atherosclerotic heart disease of native coronary artery without angina pectoris: Secondary | ICD-10-CM | POA: Diagnosis not present

## 2020-02-03 DIAGNOSIS — E119 Type 2 diabetes mellitus without complications: Secondary | ICD-10-CM

## 2020-02-03 DIAGNOSIS — E669 Obesity, unspecified: Secondary | ICD-10-CM

## 2020-02-03 DIAGNOSIS — F1129 Opioid dependence with unspecified opioid-induced disorder: Secondary | ICD-10-CM

## 2020-02-03 DIAGNOSIS — G8929 Other chronic pain: Secondary | ICD-10-CM

## 2020-02-03 DIAGNOSIS — Z79899 Other long term (current) drug therapy: Secondary | ICD-10-CM | POA: Diagnosis not present

## 2020-02-03 DIAGNOSIS — Z9861 Coronary angioplasty status: Secondary | ICD-10-CM | POA: Diagnosis not present

## 2020-02-03 DIAGNOSIS — Z720 Tobacco use: Secondary | ICD-10-CM

## 2020-02-03 LAB — BAYER DCA HB A1C WAIVED: HB A1C (BAYER DCA - WAIVED): 5.6 % (ref <7.0)

## 2020-02-03 MED ORDER — OXYCODONE HCL 10 MG PO TABS: 10.0000 mg | ORAL_TABLET | Freq: Every day | ORAL | 0 refills | Status: DC | PRN

## 2020-02-03 MED ORDER — BUDESONIDE-FORMOTEROL FUMARATE 160-4.5 MCG/ACT IN AERO: INHALATION_SPRAY | RESPIRATORY_TRACT | 0 refills | Status: DC

## 2020-02-03 NOTE — Progress Notes (Signed)
 Subjective:    Patient ID: William Carson, male    DOB: 07/30/1965, 54 y.o.   MRN: [id number]  Chief Complaint  Patient presents with  . Medical Management of Chronic Issues    jones patient  . Diabetes   PT presents to the office today to establish care. PT is followed by Cardiologists every 6 months for CAD. Pt is followed by Neurologists every 6 months for migraines.  Diabetes He presents for his follow-up diabetic visit. He has type 2 diabetes mellitus. His disease course has been stable. There are no hypoglycemic associated symptoms. Pertinent negatives for diabetes include no blurred vision and no foot paresthesias. Symptoms are stable. Diabetic complications include heart disease. Pertinent negatives for diabetic complications include no nephropathy or peripheral neuropathy. Risk factors for coronary artery disease include dyslipidemia, diabetes mellitus, male sex, hypertension and sedentary lifestyle. He is following a generally unhealthy diet. His overall blood glucose range is 130-140 mg/dl. An ACE inhibitor/angiotensin II receptor blocker is not being taken. Eye exam is not current.  Hyperlipidemia This is a chronic problem. The current episode started more than 1 year ago. The problem is uncontrolled. Recent lipid tests were reviewed and are high. Exacerbating diseases include obesity. Pertinent negatives include no shortness of breath. Current antihyperlipidemic treatment includes diet change. The current treatment provides no improvement of lipids. Risk factors for coronary artery disease include diabetes mellitus, dyslipidemia, family history, hypertension, male sex and a sedentary lifestyle.  Hypertension This is a chronic problem. The current episode started more than 1 year ago. The problem has been resolved since onset. The problem is controlled. Associated symptoms include malaise/fatigue. Pertinent negatives include no blurred vision, peripheral edema or shortness of breath.  Risk factors for coronary artery disease include dyslipidemia, diabetes mellitus, obesity, male gender, sedentary lifestyle and smoking/tobacco exposure. The current treatment provides moderate improvement. There is no history of kidney disease or heart failure.  Back Pain This is a chronic problem. The current episode started more than 1 year ago. The problem occurs intermittently. The pain is present in the lumbar spine. The quality of the pain is described as aching. The pain is at a severity of 8/10. The pain is moderate. The symptoms are aggravated by bending. Risk factors include obesity. He has tried analgesics and muscle relaxant for the symptoms. The treatment provided moderate relief.  COPD Uses Symbicort daily, uses albuterol inhaler about 3 times a month. Continues to smoke a pack a day.     Review of Systems  Constitutional: Positive for malaise/fatigue.  Eyes: Negative for blurred vision.  Respiratory: Negative for shortness of breath.   Musculoskeletal: Positive for back pain.  All other systems reviewed and are negative.      Objective:   Physical Exam Vitals reviewed.  Constitutional:      General: He is not in acute distress.    Appearance: He is well-developed. He is obese.  HENT:     Head: Normocephalic.     Right Ear: External ear normal.     Left Ear: External ear normal.  Eyes:     General:        Right eye: No discharge.        Left eye: No discharge.     Pupils: Pupils are equal, round, and reactive to light.  Neck:     Thyroid: No thyromegaly.  Cardiovascular:     Rate and Rhythm: Normal rate and regular rhythm.     Heart sounds: Normal heart sounds.   No murmur heard.   Pulmonary:     Effort: Pulmonary effort is normal. No respiratory distress.     Breath sounds: Rhonchi present. No wheezing.  Abdominal:     General: Bowel sounds are normal. There is no distension.     Palpations: Abdomen is soft.     Tenderness: There is no abdominal tenderness.    Musculoskeletal:        General: No tenderness. Normal range of motion.     Cervical back: Normal range of motion and neck supple.  Skin:    General: Skin is warm and dry.     Findings: No erythema or rash.  Neurological:     Mental Status: He is alert and oriented to person, place, and time.     Cranial Nerves: No cranial nerve deficit.     Deep Tendon Reflexes: Reflexes are normal and symmetric.  Psychiatric:        Behavior: Behavior normal.        Thought Content: Thought content normal.        Judgment: Judgment normal.       BP 129/78   Pulse 97   Temp 98.3 F (36.8 C) (Temporal)   Ht 6' 2" (1.88 m)   Wt 246 lb 9.6 oz (111.9 kg)   BMI 31.66 kg/m      Assessment & Plan:  William Carson comes in today with chief complaint of Medical Management of Chronic Issues (jones patient) and Diabetes   Diagnosis and orders addressed:  1. Chronic left-sided low back pain with left-sided sciatica - Oxycodone HCl 10 MG TABS; Take 1 tablet (10 mg total) by mouth daily as needed.  Dispense: 30 tablet; Refill: 0 - CMP14+EGFR - CBC with Differential/Platelet - ToxASSURE Select 13 (MW), Urine - Oxycodone HCl 10 MG TABS; Take 1 tablet (10 mg total) by mouth daily as needed.  Dispense: 30 tablet; Refill: 0 - Oxycodone HCl 10 MG TABS; Take 1 tablet (10 mg total) by mouth daily as needed.  Dispense: 30 tablet; Refill: 0  2. Dizziness - CMP14+EGFR - CBC with Differential/Platelet  3. Pulmonary emphysema, unspecified emphysema type (HCC) - budesonide-formoterol (SYMBICORT) 160-4.5 MCG/ACT inhaler; Inhale 2 puffs into the lungs 2 (two) times daily.  Dispense: 10.2 g; Refill: 0 - CMP14+EGFR - CBC with Differential/Platelet  4. CAD S/P percutaneous coronary angioplasty - CMP14+EGFR - CBC with Differential/Platelet  5. Common migraine with intractable migraine - CMP14+EGFR - CBC with Differential/Platelet  6. Essential hypertension - CMP14+EGFR - CBC with  Differential/Platelet  7. Type 2 diabetes mellitus without complication, without long-term current use of insulin (HCC) - CMP14+EGFR - CBC with Differential/Platelet - Bayer DCA Hb A1c Waived - Microalbumin / creatinine urine ratio  8. Pure hypercholesterolemia - CMP14+EGFR - CBC with Differential/Platelet - Lipid panel  9. Obesity (BMI 30-39.9) - CMP14+EGFR - CBC with Differential/Platelet  10. Tobacco abuse - CMP14+EGFR - CBC with Differential/Platelet  11. Controlled substance agreement signed Will stop Valium  And only give Oxycodone  - Oxycodone HCl 10 MG TABS; Take 1 tablet (10 mg total) by mouth daily as needed.  Dispense: 30 tablet; Refill: 0 - CMP14+EGFR - CBC with Differential/Platelet - ToxASSURE Select 13 (MW), Urine - Oxycodone HCl 10 MG TABS; Take 1 tablet (10 mg total) by mouth daily as needed.  Dispense: 30 tablet; Refill: 0 - Oxycodone HCl 10 MG TABS; Take 1 tablet (10 mg total) by mouth daily as needed.  Dispense: 30 tablet; Refill: 0  12. Opioid   dependence with opioid-induced disorder (HCC) - Oxycodone HCl 10 MG TABS; Take 1 tablet (10 mg total) by mouth daily as needed.  Dispense: 30 tablet; Refill: 0 - CMP14+EGFR - CBC with Differential/Platelet - ToxASSURE Select 13 (MW), Urine - Oxycodone HCl 10 MG TABS; Take 1 tablet (10 mg total) by mouth daily as needed.  Dispense: 30 tablet; Refill: 0 - Oxycodone HCl 10 MG TABS; Take 1 tablet (10 mg total) by mouth daily as needed.  Dispense: 30 tablet; Refill: 0  13. Need for hepatitis C screening test - CMP14+EGFR - CBC with Differential/Platelet - Hepatitis C antibody  Patient reviewed in [place] controlled database, no flags noted. Contract and drug screen are up dated today Labs pending Health Maintenance reviewed Diet and exercise encouraged  Follow up plan: 3 months     , FNP  

## 2020-02-03 NOTE — Patient Instructions (Signed)

## 2020-02-04 ENCOUNTER — Other Ambulatory Visit: Payer: Self-pay | Admitting: Family

## 2020-02-04 LAB — CMP14+EGFR
ALT: 20 IU/L (ref 0–44)
AST: 16 IU/L (ref 0–40)
Albumin/Globulin Ratio: 1.8 (ref 1.2–2.2)
Albumin: 4.8 g/dL (ref 3.8–4.9)
Alkaline Phosphatase: 50 IU/L (ref 48–121)
BUN/Creatinine Ratio: 13 (ref 9–20)
BUN: 12 mg/dL (ref 6–24)
Bilirubin Total: 0.4 mg/dL (ref 0.0–1.2)
CO2: 22 mmol/L (ref 20–29)
Calcium: 10.1 mg/dL (ref 8.7–10.2)
Chloride: 103 mmol/L (ref 96–106)
Creatinine, Ser: 0.9 mg/dL (ref 0.76–1.27)
GFR calc Af Amer: 111 mL/min/{1.73_m2} (ref >59)
GFR calc non Af Amer: 96 mL/min/{1.73_m2} (ref >59)
Globulin, Total: 2.7 g/dL (ref 1.5–4.5)
Glucose: 55 mg/dL — ABNORMAL LOW (ref 65–99)
Potassium: 4.7 mmol/L (ref 3.5–5.2)
Sodium: 141 mmol/L (ref 134–144)
Total Protein: 7.5 g/dL (ref 6.0–8.5)

## 2020-02-04 LAB — CBC WITH DIFFERENTIAL/PLATELET
Basophils Absolute: 0.2 10*3/uL (ref 0.0–0.2)
Basos: 1 %
EOS (ABSOLUTE): 0.3 10*3/uL (ref 0.0–0.4)
Eos: 2 %
Hematocrit: 52.6 % — ABNORMAL HIGH (ref 37.5–51.0)
Hemoglobin: 17.8 g/dL — ABNORMAL HIGH (ref 13.0–17.7)
Immature Grans (Abs): 0.1 10*3/uL (ref 0.0–0.1)
Immature Granulocytes: 1 %
Lymphocytes Absolute: 4.1 10*3/uL — ABNORMAL HIGH (ref 0.7–3.1)
Lymphs: 27 %
MCH: 31 pg (ref 26.6–33.0)
MCHC: 33.8 g/dL (ref 31.5–35.7)
MCV: 92 fL (ref 79–97)
Monocytes Absolute: 1.8 10*3/uL — ABNORMAL HIGH (ref 0.1–0.9)
Monocytes: 12 %
Neutrophils Absolute: 8.6 10*3/uL — ABNORMAL HIGH (ref 1.4–7.0)
Neutrophils: 57 %
Platelets: 451 10*3/uL — ABNORMAL HIGH (ref 150–450)
RBC: 5.74 x10E6/uL (ref 4.14–5.80)
RDW: 13.8 % (ref 11.6–15.4)
WBC: 15 10*3/uL — ABNORMAL HIGH (ref 3.4–10.8)

## 2020-02-04 LAB — LIPID PANEL
Chol/HDL Ratio: 5.8 ratio — ABNORMAL HIGH (ref 0.0–5.0)
Cholesterol, Total: 215 mg/dL — ABNORMAL HIGH (ref 100–199)
HDL: 37 mg/dL — ABNORMAL LOW (ref >39)
LDL Chol Calc (NIH): 154 mg/dL — ABNORMAL HIGH (ref 0–99)
Triglycerides: 132 mg/dL (ref 0–149)
VLDL Cholesterol Cal: 24 mg/dL (ref 5–40)

## 2020-02-04 LAB — MICROALBUMIN / CREATININE URINE RATIO
Creatinine, Urine: 209.3 mg/dL
Microalb/Creat Ratio: 8 mg/g creat (ref 0–29)
Microalbumin, Urine: 16.1 ug/mL

## 2020-02-04 LAB — HEPATITIS C ANTIBODY: Hep C Virus Ab: <0.1 s/co ratio (ref 0.0–0.9)

## 2020-02-04 MED ORDER — SITAGLIPTIN PHOSPHATE 100 MG PO TABS
100.0000 mg | ORAL_TABLET | Freq: Every day | ORAL | 2 refills | Status: DC
Start: 2020-02-04 — End: 2020-03-06

## 2020-02-04 MED ORDER — METFORMIN HCL ER 500 MG PO TB24: 500.0000 mg | ORAL_TABLET | Freq: Every day | ORAL | 3 refills | Status: DC

## 2020-02-06 LAB — TOXASSURE SELECT 13 (MW), URINE

## 2020-02-14 ENCOUNTER — Other Ambulatory Visit: Payer: BC Managed Care – PPO

## 2020-02-14 ENCOUNTER — Other Ambulatory Visit: Payer: Self-pay

## 2020-02-14 ENCOUNTER — Ambulatory Visit (INDEPENDENT_AMBULATORY_CARE_PROVIDER_SITE_OTHER): Payer: BC Managed Care – PPO

## 2020-02-14 DIAGNOSIS — M545 Low back pain, unspecified: Secondary | ICD-10-CM

## 2020-02-14 DIAGNOSIS — J439 Emphysema, unspecified: Secondary | ICD-10-CM | POA: Diagnosis not present

## 2020-02-14 LAB — CBC WITH DIFFERENTIAL/PLATELET
Basophils Absolute: 0.1 10*3/uL (ref 0.0–0.2)
Basos: 1 %
EOS (ABSOLUTE): 0.2 10*3/uL (ref 0.0–0.4)
Eos: 2 %
Hematocrit: 48.9 % (ref 37.5–51.0)
Hemoglobin: 16.6 g/dL (ref 13.0–17.7)
Immature Grans (Abs): 0.1 10*3/uL (ref 0.0–0.1)
Immature Granulocytes: 1 %
Lymphocytes Absolute: 1.9 10*3/uL (ref 0.7–3.1)
Lymphs: 19 %
MCH: 31 pg (ref 26.6–33.0)
MCHC: 33.9 g/dL (ref 31.5–35.7)
MCV: 91 fL (ref 79–97)
Monocytes Absolute: 1 10*3/uL — ABNORMAL HIGH (ref 0.1–0.9)
Monocytes: 10 %
Neutrophils Absolute: 6.7 10*3/uL (ref 1.4–7.0)
Neutrophils: 67 %
Platelets: 307 10*3/uL (ref 150–450)
RBC: 5.36 x10E6/uL (ref 4.14–5.80)
RDW: 13.6 % (ref 11.6–15.4)
WBC: 10.1 10*3/uL (ref 3.4–10.8)

## 2020-02-14 NOTE — Progress Notes (Unsigned)
Order for 2 view l-spine x-eay from [facility], [place]

## 2020-02-15 ENCOUNTER — Telehealth: Payer: Self-pay | Admitting: Family

## 2020-02-15 NOTE — Telephone Encounter (Signed)
Pt informed of results.

## 2020-02-25 ENCOUNTER — Ambulatory Visit: Payer: BC Managed Care – PPO

## 2020-02-25 ENCOUNTER — Telehealth: Payer: Self-pay

## 2020-02-25 NOTE — Progress Notes (Deleted)
Patient ID: RYSZARD SOCARRAS                 DOB: 01/14/1966                    MRN: [id number]     HPI: William Carson is a 54 y.o. male patient of Dr. Stanford Breed referred to lipid clinic by Kerin Ransom, PA. PMH is significant for hypertension, hyperlipidemia, COPD, chonic back pain, migraines, diabetes, and NSTEMI treated with DES x 2. Note intolerance to atorvastatin, rosuvastatin, and pituvastatin. All statins caused severe muscle pain and difficulty walking.   Current Medications:   Intolerances:  Rosuvastatin 20mg  daily pravastattin 40mg  daily pituvastatin 4mg  daily Ezetimibe 10mg  daily  LDL goal: 70mg /dL  Diet:   Exercise:   Family History:  family history includes Arrhythmia in his father; Heart attack in his maternal grandfather, mother, and paternal grandfather.  Social History: former cigarettes smoker, reports having 1 standard alcoholic drink per week.  Labs:  Past Medical History:  Diagnosis Date  . Adenomatous polyps   . Common migraine with intractable migraine 01/20/2019  . COPD (chronic obstructive pulmonary disease) ([place])   . Coronary artery disease   . Diabetes mellitus without complication ([place])   . Diverticulosis   . Hemorrhoids   . Hyperlipidemia   . Influenza A 10/20/2015  . Obesity     Current Outpatient Medications on File Prior to Visit  Medication Sig Dispense Refill  . AIMOVIG 140 MG/ML SOAJ Inject 140 mg into the skin every 30 (thirty) days. 1 mL 5  . albuterol (PROVENTIL) (2.5 MG/3ML) 0.083% nebulizer solution Take 3 mLs (2.5 mg total) by nebulization every 4 (four) hours as needed for wheezing or shortness of breath. 150 mL 1  . albuterol (VENTOLIN HFA) 108 (90 Base) MCG/ACT inhaler Inhale 1-2 puffs into the lungs every 6 (six) hours as needed for wheezing or shortness of breath. 54 g 1  . amLODipine (NORVASC) 5 MG tablet Take 1 tablet (5 mg total) by mouth daily. 90 tablet 0  . budesonide-formoterol (SYMBICORT) 160-4.5 MCG/ACT inhaler Inhale 2  puffs into the lungs 2 (two) times daily. 10.2 g 0  . gabapentin (NEURONTIN) 100 MG capsule Take 1 capsule (100 mg total) by mouth 3 (three) times daily. 90 capsule 3  . glucose blood (ONETOUCH VERIO) test strip Use to check BG up to once daily 100 each 2  . metFORMIN (GLUCOPHAGE XR) 500 MG 24 hr tablet Take 1 tablet (500 mg total) by mouth daily with breakfast. 90 tablet 3  . nitroGLYCERIN (NITROSTAT) 0.4 MG SL tablet Place 1 tablet (0.4 mg total) under the tongue every 5 (five) minutes as needed for chest pain. May repeat up to 3 doses. 25 tablet 3  . ONETOUCH DELICA LANCETS 26V MISC Use to check BG up to once daily 100 each 2  . Oxycodone HCl 10 MG TABS Take 1 tablet (10 mg total) by mouth daily as needed. 30 tablet 0  . Oxycodone HCl 10 MG TABS Take 1 tablet (10 mg total) by mouth daily as needed. 30 tablet 0  . Oxycodone HCl 10 MG TABS Take 1 tablet (10 mg total) by mouth daily as needed. 30 tablet 0  . sitaGLIPtin (JANUVIA) 100 MG tablet Take 1 tablet (100 mg total) by mouth daily. 90 tablet 2  . Ubrogepant (UBRELVY) 100 MG TABS Take 100 mg by mouth 2 (two) times daily as needed. 10 tablet 3  . venlafaxine XR (EFFEXOR-XR) 37.5  MG 24 hr capsule Take 1 capsule (37.5 mg total) daily with breakfast. 90 capsule 1   No current facility-administered medications on file prior to visit.    Allergies  Allergen Reactions  . Crestor [Rosuvastatin Calcium] Other (See Comments)    myalgias  . Depakote [Divalproex Sodium]     Stomach pains  . Lipitor [Atorvastatin] Other (See Comments)    Myalgias   . Meclizine     Increased dizziness  . Other Swelling    Mammelain meat allergy  . Simvastatin Other (See Comments)    Myalgias   . Statins Other (See Comments)    Myalgia   . Topamax [Topiramate]     Back pain, tinnitus  . Zonegran [Zonisamide]     Increased headache    No problem-specific Assessment & Plan notes found for this encounter.    Lauren Aguayo Rodriguez-Guzman PharmD, BCPS,  [place] [address] [place] [address] 02/25/2020 8:40 AM

## 2020-02-25 NOTE — Telephone Encounter (Signed)
Called and spoke w/pt regarding the missed appt and they stated that right now is just a busy time of year and that they do not wish to reschedule currently. Will route to the pharmd pool to make them aware they are not wanting to start pcsk9 at this time

## 2020-03-01 ENCOUNTER — Telehealth: Payer: Self-pay | Admitting: Family

## 2020-03-06 NOTE — Telephone Encounter (Signed)
Patient states that Dizziness started with the start of Januvia and Metformin.  Patient states that he takes both medications in the morning before meals.  BS reading before meds average in the low 100s(104).  Occasional evening reading when checked in the 90s.  Patient also states that his BS have "crashed" multiple times.  For example patient states that he felt BS crashing so he ate candy and other sweets to bring BS up.  Reading after sweets was 94

## 2020-03-06 NOTE — Telephone Encounter (Signed)
Pt checking status on speaking with the nurse.

## 2020-03-06 NOTE — Telephone Encounter (Signed)
Patient aware to stop Januvia, Continue Metformin and to keep track of blood sugar and let this office know if Blood sugars are to high or to low

## 2020-03-06 NOTE — Telephone Encounter (Signed)
A1C last time was really good. We will stop Januvia. Continue strict low carb diet.

## 2020-03-30 ENCOUNTER — Emergency Department (HOSPITAL_COMMUNITY): Payer: BC Managed Care – PPO

## 2020-03-30 ENCOUNTER — Other Ambulatory Visit: Payer: Self-pay

## 2020-03-30 ENCOUNTER — Encounter (HOSPITAL_COMMUNITY): Payer: Self-pay | Admitting: Emergency Medicine

## 2020-03-30 ENCOUNTER — Emergency Department (HOSPITAL_COMMUNITY)
Admission: EM | Admit: 2020-03-30 | Discharge: 2020-03-30 | Disposition: A | Discharge status: Home / Self Care | Payer: BC Managed Care – PPO | Attending: Emergency Medicine | Admitting: Emergency Medicine

## 2020-03-30 DIAGNOSIS — K5792 Diverticulitis of intestine, part unspecified, without perforation or abscess without bleeding: Secondary | ICD-10-CM

## 2020-03-30 DIAGNOSIS — I2510 Atherosclerotic heart disease of native coronary artery without angina pectoris: Secondary | ICD-10-CM | POA: Insufficient documentation

## 2020-03-30 DIAGNOSIS — K7689 Other specified diseases of liver: Secondary | ICD-10-CM | POA: Diagnosis not present

## 2020-03-30 DIAGNOSIS — Z87891 Personal history of nicotine dependence: Secondary | ICD-10-CM | POA: Diagnosis not present

## 2020-03-30 DIAGNOSIS — Z7984 Long term (current) use of oral hypoglycemic drugs: Secondary | ICD-10-CM | POA: Insufficient documentation

## 2020-03-30 DIAGNOSIS — E119 Type 2 diabetes mellitus without complications: Secondary | ICD-10-CM | POA: Insufficient documentation

## 2020-03-30 DIAGNOSIS — Z951 Presence of aortocoronary bypass graft: Secondary | ICD-10-CM | POA: Insufficient documentation

## 2020-03-30 DIAGNOSIS — J449 Chronic obstructive pulmonary disease, unspecified: Secondary | ICD-10-CM | POA: Insufficient documentation

## 2020-03-30 DIAGNOSIS — R109 Unspecified abdominal pain: Secondary | ICD-10-CM | POA: Diagnosis not present

## 2020-03-30 DIAGNOSIS — K5730 Diverticulosis of large intestine without perforation or abscess without bleeding: Secondary | ICD-10-CM | POA: Diagnosis not present

## 2020-03-30 DIAGNOSIS — K5732 Diverticulitis of large intestine without perforation or abscess without bleeding: Secondary | ICD-10-CM | POA: Diagnosis not present

## 2020-03-30 DIAGNOSIS — Z7951 Long term (current) use of inhaled steroids: Secondary | ICD-10-CM | POA: Diagnosis not present

## 2020-03-30 DIAGNOSIS — Z79899 Other long term (current) drug therapy: Secondary | ICD-10-CM | POA: Insufficient documentation

## 2020-03-30 DIAGNOSIS — R1011 Right upper quadrant pain: Secondary | ICD-10-CM

## 2020-03-30 DIAGNOSIS — K760 Fatty (change of) liver, not elsewhere classified: Secondary | ICD-10-CM | POA: Diagnosis not present

## 2020-03-30 DIAGNOSIS — D3500 Benign neoplasm of unspecified adrenal gland: Secondary | ICD-10-CM | POA: Diagnosis not present

## 2020-03-30 LAB — LIPASE, BLOOD: Lipase: 27 U/L (ref 11–51)

## 2020-03-30 LAB — URINALYSIS, ROUTINE W REFLEX MICROSCOPIC
Bilirubin Urine: NEGATIVE
Glucose, UA: NEGATIVE mg/dL
Hgb urine dipstick: NEGATIVE
Ketones, ur: NEGATIVE mg/dL
Leukocytes,Ua: NEGATIVE
Nitrite: NEGATIVE
Protein, ur: NEGATIVE mg/dL
Specific Gravity, Urine: 1.011 (ref 1.005–1.030)
pH: 6 (ref 5.0–8.0)

## 2020-03-30 LAB — COMPREHENSIVE METABOLIC PANEL
ALT: 22 U/L (ref 0–44)
AST: 15 U/L (ref 15–41)
Albumin: 4.5 g/dL (ref 3.5–5.0)
Alkaline Phosphatase: 37 U/L — ABNORMAL LOW (ref 38–126)
Anion gap: 10 (ref 5–15)
BUN: 9 mg/dL (ref 6–20)
CO2: 24 mmol/L (ref 22–32)
Calcium: 9.6 mg/dL (ref 8.9–10.3)
Chloride: 103 mmol/L (ref 98–111)
Creatinine, Ser: 0.79 mg/dL (ref 0.61–1.24)
GFR calc Af Amer: >60 mL/min (ref >60)
GFR calc non Af Amer: >60 mL/min (ref >60)
Glucose, Bld: 102 mg/dL — ABNORMAL HIGH (ref 70–99)
Potassium: 4.7 mmol/L (ref 3.5–5.1)
Sodium: 137 mmol/L (ref 135–145)
Total Bilirubin: 1.1 mg/dL (ref 0.3–1.2)
Total Protein: 7.5 g/dL (ref 6.5–8.1)

## 2020-03-30 LAB — CBC
HCT: 54.1 % — ABNORMAL HIGH (ref 39.0–52.0)
Hemoglobin: 17.4 g/dL — ABNORMAL HIGH (ref 13.0–17.0)
MCH: 31.6 pg (ref 26.0–34.0)
MCHC: 32.2 g/dL (ref 30.0–36.0)
MCV: 98.4 fL (ref 80.0–100.0)
Platelets: 237 10*3/uL (ref 150–400)
RBC: 5.5 MIL/uL (ref 4.22–5.81)
RDW: 14.8 % (ref 11.5–15.5)
WBC: 14.5 10*3/uL — ABNORMAL HIGH (ref 4.0–10.5)
nRBC: 0 % (ref 0.0–0.2)

## 2020-03-30 MED ORDER — CIPROFLOXACIN HCL 500 MG PO TABS: 500.0000 mg | ORAL_TABLET | Freq: Two times a day (BID) | ORAL | 0 refills | Status: DC

## 2020-03-30 MED ORDER — METRONIDAZOLE 500 MG PO TABS: 500.0000 mg | ORAL_TABLET | Freq: Three times a day (TID) | ORAL | 0 refills | Status: AC

## 2020-03-30 MED ORDER — CIPROFLOXACIN HCL 250 MG PO TABS
500.0000 mg | ORAL_TABLET | Freq: Once | ORAL | Status: AC
  Administered 2020-03-30: 500 mg via ORAL
  Filled 2020-03-30: qty 2

## 2020-03-30 MED ORDER — MORPHINE SULFATE (PF) 4 MG/ML IV SOLN: 4.0000 mg | Freq: Once | INTRAVENOUS | Status: DC

## 2020-03-30 MED ORDER — METRONIDAZOLE 500 MG PO TABS
500.0000 mg | ORAL_TABLET | Freq: Once | ORAL | Status: AC
  Administered 2020-03-30: 500 mg via ORAL
  Filled 2020-03-30: qty 1

## 2020-03-30 MED ORDER — IOHEXOL 300 MG/ML  SOLN
100.0000 mL | Freq: Once | INTRAMUSCULAR | Status: AC | PRN
  Administered 2020-03-30: 100 mL via INTRAVENOUS

## 2020-03-30 NOTE — ED Triage Notes (Signed)
Pt reports RUQ pain since tuesday that is now radiating to bilateral lower quadrant this am.pt denies any known injury, n/v/d. Last BM 03/29/2020.

## 2020-03-30 NOTE — Discharge Instructions (Addendum)
Your CT scan showed findings concerning for diverticulitis. Please pick up antibiotics and take as prescribed. Drink plenty of fluids to stay hydrated.   Follow up with your PCP regarding your ED visit and for reevaluation next week.   Return to the ED IMMEDIATELY for any worsening symptoms including worsening pain, no improvement after 48 hours on antibiotics, excessive vomiting, fevers > 100.4, or any other new/concerning symptoms.

## 2020-03-30 NOTE — ED Provider Notes (Signed)
[facility] EMERGENCY DEPARTMENT Provider Note   CSN: [id number] Arrival date & time: 03/30/20  1051     History Chief Complaint  Patient presents with  . Abdominal Pain    William Carson is a 54 y.o. male with PMHx CAD, COPD, Type II Diabetes, and hx of kidney stones who presents to the ED today with complaint of gradual onset, constant, sharp, RUQ pain x 2 days. Pt reports the pain began radiating into lower abdomen prompting him to come to the ED today. He denies any nausea, vomiting, diarrhea. He reports the pain began about 1 hour after eating a pork chop sandwich. He does state that he has had similar pain in the past - states it feel like when he had a "kidney bleed." He has been taking oxycodone prescribed for chronic back pain without relief. Pt denies fevers or chills.   Per chart review: Pt presented to the ED on 06/23/2018 for abdominal pain and underwent CT imaging which showed:  IMPRESSION: 1. Moderate to large RIGHT subcapsular perinephric hematoma with active arterial extravasation. Extension of moderate to large amount of ill-defined hemorrhage into the RIGHT retroperitoneum. The cause of this hemorrhage is not determined on this study. 2. Mild hepatic steatosis 3.  Aortic Atherosclerosis (ICD10-I70.0).   Pt was admitted to the hospital at that time and underwent a CTA per IR recommendations which did not show any enlargement from previous findings on CT earlier the next day. It was recommended that pt have a follow up contrast CT or MRI in 3-6 months to evaluate for a lesion as a potential source of hemorrhage. No need for IR embolizatin at that time.   Pt states that since the pandemic started shortly afterwards he never had repeat imaging. He states he saw Dr. Carlean Purl with urology twice afterwards however they wanted to wait for him to have repeat imaging.   The history is provided by the patient and medical records.       Past Medical History:  Diagnosis Date  .  Adenomatous polyps   . Common migraine with intractable migraine 01/20/2019  . COPD (chronic obstructive pulmonary disease) ([place])   . Coronary artery disease   . Diabetes mellitus without complication ([place])   . Diverticulosis   . Hemorrhoids   . Hyperlipidemia   . Influenza A 10/20/2015  . Obesity     Patient Active Problem List   Diagnosis Date Noted  . Essential hypertension 01/19/2020  . Common migraine with intractable migraine 01/20/2019  . Perinephric hematoma 06/23/2018  . Dizziness 05/08/2017  . Headache syndrome 05/08/2017  . History of diverticulitis 12/24/2016  . History of kidney stones 12/24/2016  . T2DM (type 2 diabetes mellitus) ([place]) 05/13/2016  . Allergy to meat 05/07/2016  . Chronic left-sided low back pain with left-sided sciatica 05/07/2016  . Respiratory failure with hypoxia and hypercapnia ([place]) 10/20/2015  . Obesity (BMI 30-39.9) 08/04/2015  . Low serum vitamin D 04/12/2015  . Tobacco abuse 01/18/2011  . Hyperlipidemia 07/18/2010  . CAD S/P percutaneous coronary angioplasty 07/18/2010  . COPD (chronic obstructive pulmonary disease) ([place]) 07/18/2010    Past Surgical History:  Procedure Laterality Date  . ANTERIOR CRUCIATE LIGAMENT REPAIR     S/P  . CORONARY STENT PLACEMENT  2011   2 stents       Family History  Problem Relation Age of Onset  . Heart attack Mother        Infarction  . Arrhythmia Father  Atrial fibrillation  . Heart attack Maternal Grandfather   . Heart attack Paternal Grandfather     Social History   Tobacco Use  . Smoking status: Former Smoker    Packs/day: 1.50    Years: 30.00    Pack years: 45.00    Types: Cigarettes    Quit date: 10/16/2015    Years since quitting: 4.4  . Smokeless tobacco: Former Network engineer  . Vaping Use: Never used  Substance Use Topics  . Alcohol use: Yes    Alcohol/week: 1.0 standard drink    Types: 1 Standard drinks or equivalent per week  . Drug use: No    Home  Medications Prior to Admission medications   Medication Sig Start Date End Date Taking? Authorizing Provider  AIMOVIG 140 MG/ML SOAJ Inject 140 mg into the skin every 30 (thirty) days. 07/29/19   Kathrynn Ducking, MD  albuterol (PROVENTIL) (2.5 MG/3ML) 0.083% nebulizer solution Take 3 mLs (2.5 mg total) by nebulization every 4 (four) hours as needed for wheezing or shortness of breath. 10/31/15   Timmothy Euler, MD  albuterol (VENTOLIN HFA) 108 (90 Base) MCG/ACT inhaler Inhale 1-2 puffs into the lungs every 6 (six) hours as needed for wheezing or shortness of breath. 02/03/19   Terald Sleeper, PA-C  amLODipine (NORVASC) 5 MG tablet Take 1 tablet (5 mg total) by mouth daily. 12/14/19 03/13/20  Dettinger, Fransisca Kaufmann, MD  budesonide-formoterol (SYMBICORT) 160-4.5 MCG/ACT inhaler Inhale 2 puffs into the lungs 2 (two) times daily. 02/03/20   Sharion Balloon, FNP  ciprofloxacin (CIPRO) 500 MG tablet Take 1 tablet (500 mg total) by mouth 2 (two) times daily. 03/30/20   Alroy Bailiff, Cord Wilczynski, PA-C  gabapentin (NEURONTIN) 100 MG capsule Take 1 capsule (100 mg total) by mouth 3 (three) times daily. 04/22/19   Kathrynn Ducking, MD  glucose blood [facility] VERIO) test strip Use to check BG up to once daily 05/14/16   Cherre Robins, PharmD  metFORMIN (GLUCOPHAGE XR) 500 MG 24 hr tablet Take 1 tablet (500 mg total) by mouth daily with breakfast. 02/04/20 02/03/21  Sharion Balloon, FNP  metroNIDAZOLE (FLAGYL) 500 MG tablet Take 1 tablet (500 mg total) by mouth 3 (three) times daily for 7 days. 03/30/20 04/06/20  Eustaquio Maize, PA-C  nitroGLYCERIN (NITROSTAT) 0.4 MG SL tablet Place 1 tablet (0.4 mg total) under the tongue every 5 (five) minutes as needed for chest pain. May repeat up to 3 doses. 08/05/18   Terald Sleeper, PA-C  ONETOUCH DELICA LANCETS 28N MISC Use to check BG up to once daily 05/14/16   Cherre Robins, PharmD  Oxycodone HCl 10 MG TABS Take 1 tablet (10 mg total) by mouth daily as needed. 02/03/20   [name] A,  FNP  Oxycodone HCl 10 MG TABS Take 1 tablet (10 mg total) by mouth daily as needed. 02/03/20   [name] A, FNP  Oxycodone HCl 10 MG TABS Take 1 tablet (10 mg total) by mouth daily as needed. 02/03/20   Hawks, Christy A, FNP  Ubrogepant (UBRELVY) 100 MG TABS Take 100 mg by mouth 2 (two) times daily as needed. 04/22/19   Kathrynn Ducking, MD  venlafaxine XR (EFFEXOR-XR) 37.5 MG 24 hr capsule Take 1 capsule (37.5 mg total) daily with breakfast. 12/15/19   Kathrynn Ducking, MD    Allergies    Crestor [rosuvastatin calcium], Depakote [divalproex sodium], Lipitor [atorvastatin], Meclizine, Other, Simvastatin, Statins, Topamax [topiramate], and Zonegran [zonisamide]  Review of  Systems   Review of Systems  Constitutional: Positive for appetite change. Negative for chills and fever.  Respiratory: Negative for shortness of breath.   Cardiovascular: Negative for chest pain.  Gastrointestinal: Positive for abdominal pain. Negative for diarrhea, nausea and vomiting.  Genitourinary: Negative for difficulty urinating, flank pain and frequency.  All other systems reviewed and are negative.   Physical Exam Updated Vital Signs BP (!) 150/84 (BP Location: Right Arm)   Pulse 90   Temp 98.9 F (37.2 C) (Oral)   Resp 18   Ht 6\' 2"  (1.88 m)   Wt 114.3 kg   SpO2 93%   BMI 32.35 kg/m   Physical Exam Vitals and nursing note reviewed.  Constitutional:      Appearance: He is not ill-appearing or diaphoretic.  HENT:     Head: Normocephalic and atraumatic.  Eyes:     Conjunctiva/sclera: Conjunctivae normal.  Cardiovascular:     Rate and Rhythm: Normal rate and regular rhythm.  Pulmonary:     Effort: Pulmonary effort is normal.     Breath sounds: Normal breath sounds.  Abdominal:     Palpations: Abdomen is soft.     Tenderness: There is abdominal tenderness. There is no right CVA tenderness, left CVA tenderness, guarding or rebound.     Comments: Soft, diffuse abdominal TTP, unable to reproduce  RUQ pain (pt points underneath rib cage when describing pain), +BS throughout, no r/g/r, neg murphy's, neg mcburney's, no CVA TTP  Musculoskeletal:     Cervical back: Neck supple.  Skin:    General: Skin is warm and dry.  Neurological:     Mental Status: He is alert.     ED Results / Procedures / Treatments   Labs (all labs ordered are listed, but only abnormal results are displayed) Labs Reviewed  COMPREHENSIVE METABOLIC PANEL - Abnormal; Notable for the following components:      Result Value   Glucose, Bld 102 (*)    Alkaline Phosphatase 37 (*)    All other components within normal limits  CBC - Abnormal; Notable for the following components:   WBC 14.5 (*)    Hemoglobin 17.4 (*)    HCT 54.1 (*)    All other components within normal limits  LIPASE, BLOOD  URINALYSIS, ROUTINE W REFLEX MICROSCOPIC    EKG None  Radiology CT Abdomen Pelvis W Contrast  Result Date: 03/30/2020 CLINICAL DATA:  Flank pain, history of perinephric hematoma EXAM: CT ABDOMEN AND PELVIS WITH CONTRAST TECHNIQUE: Multidetector CT imaging of the abdomen and pelvis was performed using the standard protocol following bolus administration of intravenous contrast. CONTRAST:  128mL OMNIPAQUE IOHEXOL 300 MG/ML  SOLN COMPARISON:  09/16/2018 FINDINGS: Lower chest: No acute abnormality. Bandlike scarring the bilateral lung bases. Hepatobiliary: No solid liver abnormality is seen. Hepatic steatosis. No gallstones, gallbladder wall thickening, or biliary dilatation. Pancreas: Unremarkable. No pancreatic ductal dilatation or surrounding inflammatory changes. Spleen: Normal in size without significant abnormality. Adrenals/Urinary Tract: Unchanged incidental benign subcentimeter left adrenal adenoma. Kidneys are normal, without renal calculi, solid lesion, or hydronephrosis. Bladder is unremarkable. Stomach/Bowel: Stomach is within normal limits. Appendix appears normal. Sigmoid diverticulosis. Fat stranding about the distal  descending and proximal sigmoid (series 2, image 50). Vascular/Lymphatic: Aortic atherosclerosis. No enlarged abdominal or pelvic lymph nodes. Reproductive: No mass or other significant abnormality. Other: No abdominal wall hernia or abnormality. No abdominopelvic ascites. Musculoskeletal: No acute or significant osseous findings. IMPRESSION: 1. Descending and sigmoid diverticulosis with fat stranding about the distal descending  and proximal sigmoid. Findings are consistent with acute diverticulitis. No evidence of perforation or abscess. 2. Hepatic steatosis. 3. Aortic Atherosclerosis (ICD10-I70.0). Electronically Signed   By: Eddie Candle M.D.   On: 03/30/2020 16:57   US Abdomen Limited RUQ  Result Date: 03/30/2020 CLINICAL DATA:  Right upper quadrant abdominal pain. EXAM: ULTRASOUND ABDOMEN LIMITED RIGHT UPPER QUADRANT COMPARISON:  Most recent CT 09/16/2018 FINDINGS: Gallbladder: Partially distended and elongated. No gallstones. No gallbladder wall thickening for degree of distension. No sonographic Murphy sign noted by sonographer. Common bile duct: Diameter: 2 mm, normal. Liver: Cyst in the left lobe measures 2.0 x 1.4 x 0.8 cm. Heterogeneously increased in parenchymal echogenicity. Portal vein is patent on color Doppler imaging with normal direction of blood flow towards the liver. Other: No right upper quadrant ascites. IMPRESSION: 1. No gallstones or sonographic findings of acute cholecystitis. No biliary dilatation. 2. Mild hepatic steatosis.  Hepatic cyst. Electronically Signed   By: Keith Rake M.D.   On: 03/30/2020 15:25    Procedures Procedures (including critical care time)  Medications Ordered in ED Medications  morphine 4 MG/ML injection 4 mg (has no administration in time range)  ciprofloxacin (CIPRO) tablet 500 mg (has no administration in time range)  metroNIDAZOLE (FLAGYL) tablet 500 mg (has no administration in time range)  iohexol (OMNIPAQUE) 300 MG/ML solution 100 mL (100  mLs Intravenous Contrast Given 03/30/20 1634)    ED Course  I have reviewed the triage vital signs and the nursing notes.  Pertinent labs & imaging results that were available during my care of the patient were reviewed by me and considered in my medical decision making (see chart for details).    MDM Rules/Calculators/A&P                          54 year old male with a history of perinephric hematoma who presents to the ED today complaining of right upper quadrant abdominal pain for the past 2 days.  On arrival to the ED patient is afebrile, nontachycardic nontachypneic.  A right upper quadrant ultrasound was ordered while patient was in the waiting room per triage report.  Lab work was also obtained while in the waiting room which showed a white blood cell count of 14.5, hemoglobin elevated at 17.4/hematocrit 54.1.  Suspect some degree of hemoconcentration related to elevated hemoglobin and hematocrit however cannot account for elevation in white blood cell count.  Patient denies any fevers or chills at home however I am suspicious for possible infection.  CMP with glucose of 102.  No other electrolyte abnormalities.  Creatinine stable at 0.79.  Lipase within normal limits at 27.  Urinalysis without infection, no hemoglobin appreciated.  When patient is brought back to the room after he had undergone right upper quadrant ultrasound he states that this feels similar to when he had a "bleed" on his kidney.  Per chart review as above in 2019 with perinephric hematoma of unknown origin.  On my exam patient has diffuse abdominal tenderness palpation without rebound or guarding.  He does point underneath his rib cage on the right side when describing pain.  No obvious right upper quadrant abdominal tenderness palpation or Murphy sign today.  No flank pain detected.  Given history will obtain CT scan with contrast to rule out repeat perinephric hematoma.  Will provide morphine for pain.  CT scan with  findings for acute diverticulitis. Pt does report he has hx of same. There are no signs  of abscess or other complication on the CT scan. Will provide oral abx here in the ED and discharge home with same. Pt to follow up with PCP next week for recheck. Strict return precautions discussed. Pt is in agreement with plan and stable for discharge home.   This note was prepared using Dragon voice recognition software and may include unintentional dictation errors due to the inherent limitations of voice recognition software.  Final Clinical Impression(s) / ED Diagnoses Final diagnoses:  RUQ abdominal pain  Diverticulitis    Rx / DC Orders ED Discharge Orders         Ordered    ciprofloxacin (CIPRO) 500 MG tablet  2 times daily        03/30/20 1704    metroNIDAZOLE (FLAGYL) 500 MG tablet  3 times daily        03/30/20 1704           Discharge Instructions     Your CT scan showed findings concerning for diverticulitis. Please pick up antibiotics and take as prescribed. Drink plenty of fluids to stay hydrated.   Follow up with your PCP regarding your ED visit and for reevaluation next week.   Return to the ED IMMEDIATELY for any worsening symptoms including worsening pain, no improvement after 48 hours on antibiotics, excessive vomiting, fevers > 100.4, or any other new/concerning symptoms.        Eustaquio Maize, PA-C 03/30/20 1707    Davonna Belling, MD 03/30/20 [phone]

## 2020-04-04 ENCOUNTER — Other Ambulatory Visit: Payer: Self-pay | Admitting: Family Medicine

## 2020-04-04 DIAGNOSIS — I10 Essential (primary) hypertension: Secondary | ICD-10-CM

## 2020-04-18 ENCOUNTER — Encounter: Payer: Self-pay | Admitting: *Deleted

## 2020-05-05 ENCOUNTER — Ambulatory Visit: Payer: BC Managed Care – PPO | Admitting: Family

## 2020-05-12 ENCOUNTER — Encounter: Payer: Self-pay | Admitting: *Deleted

## 2020-05-12 ENCOUNTER — Other Ambulatory Visit: Payer: Self-pay | Admitting: Family

## 2020-05-12 DIAGNOSIS — J439 Emphysema, unspecified: Secondary | ICD-10-CM

## 2020-05-26 ENCOUNTER — Ambulatory Visit: Payer: BC Managed Care – PPO | Admitting: Family

## 2020-06-09 ENCOUNTER — Ambulatory Visit (INDEPENDENT_AMBULATORY_CARE_PROVIDER_SITE_OTHER): Payer: BC Managed Care – PPO | Admitting: Family

## 2020-06-09 ENCOUNTER — Other Ambulatory Visit: Payer: Self-pay

## 2020-06-09 ENCOUNTER — Encounter: Payer: Self-pay | Admitting: Family

## 2020-06-09 VITALS — BP 134/77 | HR 82 | Temp 97.7°F | Ht 74.0 in | Wt 259.6 lb

## 2020-06-09 DIAGNOSIS — Z23 Encounter for immunization: Secondary | ICD-10-CM

## 2020-06-09 DIAGNOSIS — F1129 Opioid dependence with unspecified opioid-induced disorder: Secondary | ICD-10-CM

## 2020-06-09 DIAGNOSIS — E119 Type 2 diabetes mellitus without complications: Secondary | ICD-10-CM | POA: Diagnosis not present

## 2020-06-09 DIAGNOSIS — G8929 Other chronic pain: Secondary | ICD-10-CM

## 2020-06-09 DIAGNOSIS — M5442 Lumbago with sciatica, left side: Secondary | ICD-10-CM | POA: Diagnosis not present

## 2020-06-09 DIAGNOSIS — Z79899 Other long term (current) drug therapy: Secondary | ICD-10-CM

## 2020-06-09 DIAGNOSIS — Z720 Tobacco use: Secondary | ICD-10-CM

## 2020-06-09 DIAGNOSIS — E7800 Pure hypercholesterolemia, unspecified: Secondary | ICD-10-CM

## 2020-06-09 DIAGNOSIS — J439 Emphysema, unspecified: Secondary | ICD-10-CM

## 2020-06-09 DIAGNOSIS — G43019 Migraine without aura, intractable, without status migrainosus: Secondary | ICD-10-CM

## 2020-06-09 DIAGNOSIS — E669 Obesity, unspecified: Secondary | ICD-10-CM

## 2020-06-09 DIAGNOSIS — Z789 Other specified health status: Secondary | ICD-10-CM | POA: Insufficient documentation

## 2020-06-09 DIAGNOSIS — I10 Essential (primary) hypertension: Secondary | ICD-10-CM

## 2020-06-09 LAB — BAYER DCA HB A1C WAIVED: HB A1C (BAYER DCA - WAIVED): 5.6 % (ref <7.0)

## 2020-06-09 MED ORDER — OXYCODONE HCL 10 MG PO TABS
10.0000 mg | ORAL_TABLET | Freq: Every day | ORAL | 0 refills | Status: DC | PRN
Start: 2020-06-09 — End: 2020-09-11

## 2020-06-09 MED ORDER — AMLODIPINE BESYLATE 5 MG PO TABS: 5.0000 mg | ORAL_TABLET | Freq: Every day | ORAL | 0 refills | Status: DC

## 2020-06-09 MED ORDER — GABAPENTIN 100 MG PO CAPS: 100.0000 mg | ORAL_CAPSULE | Freq: Three times a day (TID) | ORAL | 3 refills | Status: DC

## 2020-06-09 MED ORDER — OXYCODONE HCL 10 MG PO TABS: 10.0000 mg | ORAL_TABLET | Freq: Every day | ORAL | 0 refills | Status: DC | PRN

## 2020-06-09 MED ORDER — BUDESONIDE-FORMOTEROL FUMARATE 160-4.5 MCG/ACT IN AERO: INHALATION_SPRAY | RESPIRATORY_TRACT | 0 refills | Status: DC

## 2020-06-09 MED ORDER — ASPIRIN EC 81 MG PO TBEC: 81.0000 mg | DELAYED_RELEASE_TABLET | Freq: Every day | ORAL | 11 refills | Status: DC

## 2020-06-09 MED ORDER — LISINOPRIL 10 MG PO TABS: 10.0000 mg | ORAL_TABLET | Freq: Every day | ORAL | 3 refills | Status: DC

## 2020-06-09 MED ORDER — METFORMIN HCL ER 500 MG PO TB24: 500.0000 mg | ORAL_TABLET | Freq: Every day | ORAL | 3 refills | Status: DC

## 2020-06-09 NOTE — Progress Notes (Addendum)
Subjective:    Patient ID: William Carson, male    DOB: [birth year], 54 y.o.   MRN: [id number]  Chief Complaint  Patient presents with  . Medical Management of Chronic Issues   PT presents to the office today for chronic follow up. PT is followed by Cardiologists every 6 months for CAD. Pt has seen Neurologists  for migraines, but states it was not helping and quit.   Diabetes He presents for his follow-up diabetic visit. He has type 2 diabetes mellitus. His disease course has been stable. There are no hypoglycemic associated symptoms. Pertinent negatives for diabetes include no blurred vision and no foot paresthesias. Symptoms are stable. Pertinent negatives for diabetic complications include no heart disease, nephropathy or peripheral neuropathy. Risk factors for coronary artery disease include dyslipidemia, diabetes mellitus, male sex, hypertension and sedentary lifestyle. He is following a generally unhealthy diet. His overall blood glucose range is 130-140 mg/dl. Eye exam is not current.  Hypertension This is a chronic problem. The current episode started more than 1 year ago. The problem has been resolved since onset. The problem is controlled. Associated symptoms include malaise/fatigue. Pertinent negatives include no blurred vision, peripheral edema or shortness of breath. Risk factors for coronary artery disease include dyslipidemia, diabetes mellitus, obesity, male gender, sedentary lifestyle and smoking/tobacco exposure. The current treatment provides moderate improvement.  Nicotine Dependence Presents for follow-up visit. His urge triggers include company of smokers. The symptoms have been stable. He smokes 1 pack of cigarettes per day.  Migraine  This is a chronic problem. Episode onset: twice a week. The problem has been waxing and waning. The pain is located in the occipital region. Associated symptoms include back pain, nausea, phonophobia and photophobia. Pertinent negatives  include no blurred vision. His past medical history is significant for hypertension.  Back Pain This is a chronic problem. The current episode started more than 1 year ago. The problem occurs intermittently. The problem has been waxing and waning since onset. The pain is present in the lumbar spine. The quality of the pain is described as aching. The pain is at a severity of 6/10. The pain is moderate. He has tried analgesics for the symptoms. The treatment provided mild relief.  Hyperlipidemia This is a chronic problem. The current episode started more than 1 year ago. The problem is uncontrolled. Recent lipid tests were reviewed and are high. Pertinent negatives include no shortness of breath. Current antihyperlipidemic treatment includes statins. The current treatment provides moderate improvement of lipids. Risk factors for coronary artery disease include dyslipidemia, diabetes mellitus, male sex, hypertension and a sedentary lifestyle.  COPD PT currently smoking a pack a day. Using Symbicort daily. States he gets SOB with exertion.    Review of Systems  Constitutional: Positive for malaise/fatigue.  Eyes: Positive for photophobia. Negative for blurred vision.  Respiratory: Negative for shortness of breath.   Gastrointestinal: Positive for nausea.  Musculoskeletal: Positive for back pain.  All other systems reviewed and are negative.      Objective:   Physical Exam Vitals reviewed.  Constitutional:      General: He is not in acute distress.    Appearance: He is well-developed.  HENT:     Head: Normocephalic.     Right Ear: Tympanic membrane normal.     Left Ear: Tympanic membrane normal.  Eyes:     General:        Right eye: No discharge.        Left eye: No discharge.  Pupils: Pupils are equal, round, and reactive to light.  Neck:     Thyroid: No thyromegaly.  Cardiovascular:     Rate and Rhythm: Normal rate and regular rhythm.     Heart sounds: Normal heart sounds. No  murmur heard.   Pulmonary:     Effort: Pulmonary effort is normal. No respiratory distress.     Breath sounds: Normal breath sounds. No wheezing.  Abdominal:     General: Bowel sounds are normal. There is no distension.     Palpations: Abdomen is soft.     Tenderness: There is no abdominal tenderness.  Musculoskeletal:        General: No tenderness. Normal range of motion.     Cervical back: Normal range of motion and neck supple.  Skin:    General: Skin is warm and dry.     Findings: No erythema or rash.  Neurological:     Mental Status: He is alert and oriented to person, place, and time.     Cranial Nerves: No cranial nerve deficit.     Deep Tendon Reflexes: Reflexes are normal and symmetric.  Psychiatric:        Behavior: Behavior normal.        Thought Content: Thought content normal.        Judgment: Judgment normal.       BP 134/77   Pulse 82   Temp 97.7 F (36.5 C) (Temporal)   Ht 6\' 2"  (1.88 m)   Wt 259 lb 9.6 oz (117.8 kg)   SpO2 94%   BMI 33.33 kg/m      Assessment & Plan:  William Carson comes in today with chief complaint of Medical Management of Chronic Issues   Diagnosis and orders addressed:  1. Chronic left-sided low back pain with left-sided sciatica - Oxycodone HCl 10 MG TABS; Take 1 tablet (10 mg total) by mouth daily as needed.  Dispense: 30 tablet; Refill: 0 - Oxycodone HCl 10 MG TABS; Take 1 tablet (10 mg total) by mouth daily as needed.  Dispense: 30 tablet; Refill: 0 - Oxycodone HCl 10 MG TABS; Take 1 tablet (10 mg total) by mouth daily as needed.  Dispense: 30 tablet; Refill: 0  2. Controlled substance agreement signed - Oxycodone HCl 10 MG TABS; Take 1 tablet (10 mg total) by mouth daily as needed.  Dispense: 30 tablet; Refill: 0 - Oxycodone HCl 10 MG TABS; Take 1 tablet (10 mg total) by mouth daily as needed.  Dispense: 30 tablet; Refill: 0 - Oxycodone HCl 10 MG TABS; Take 1 tablet (10 mg total) by mouth daily as needed.  Dispense: 30  tablet; Refill: 0  3. Opioid dependence with opioid-induced disorder (HCC) - Oxycodone HCl 10 MG TABS; Take 1 tablet (10 mg total) by mouth daily as needed.  Dispense: 30 tablet; Refill: 0 - Oxycodone HCl 10 MG TABS; Take 1 tablet (10 mg total) by mouth daily as needed.  Dispense: 30 tablet; Refill: 0 - Oxycodone HCl 10 MG TABS; Take 1 tablet (10 mg total) by mouth daily as needed.  Dispense: 30 tablet; Refill: 0  4. Pulmonary emphysema, unspecified emphysema type ([place]) - budesonide-formoterol (SYMBICORT) 160-4.5 MCG/ACT inhaler; Inhale 2 puffs into the lungs 2 (two) times daily.  Dispense: 10.2 g; Refill: 0  5. Essential hypertension - amLODipine (NORVASC) 5 MG tablet; Take 1 tablet (5 mg total) by mouth daily.  Dispense: 90 tablet; Refill: 0 - lisinopril (ZESTRIL) 10 MG tablet; Take 1 tablet (10 mg  total) by mouth daily.  Dispense: 90 tablet; Refill: 3  6. Type 2 diabetes mellitus without complication, without long-term current use of insulin (HCC) -Will add Lisinopril 10 mg today - lisinopril (ZESTRIL) 10 MG tablet; Take 1 tablet (10 mg total) by mouth daily.  Dispense: 90 tablet; Refill: 3 - Bayer DCA Hb A1c Waived  7. Tobacco abuse  8. Obesity (BMI 30-39.9)  9. Pure hypercholesterolemia  10. Statin intolerance  11. Common migraine with intractable migraine   Labs pending Patient reviewed in [place] controlled database, no flags noted. Contract and drug screen are up to date.  Health Maintenance reviewed Diet and exercise encouraged  Follow up plan: 3 months    [name], FNP

## 2020-06-09 NOTE — Patient Instructions (Signed)
Diabetes Mellitus and Standards of Medical Care Managing diabetes (diabetes mellitus) can be complicated. Your diabetes treatment may be managed by a team of health care providers, including:  A physician who specializes in diabetes (endocrinologist).  A nurse practitioner or physician assistant.  Nurses.  A diet and nutrition specialist (registered dietitian).  A certified diabetes educator (CDE).  An exercise specialist.  A pharmacist.  An eye doctor.  A foot specialist (podiatrist).  A dentist.  A primary care provider.  A mental health provider. Your health care providers follow guidelines to help you get the best quality of care. The following schedule is a general guideline for your diabetes management plan. Your health care providers may give you more specific instructions. Physical exams Upon being diagnosed with diabetes mellitus, and each year after that, your health care provider will ask about your medical and family history. He or she will also do a physical exam. Your exam may include:  Measuring your height, weight, and body mass index (BMI).  Checking your blood pressure. This will be done at every routine medical visit. Your target blood pressure may vary depending on your medical conditions, your age, and other factors.  Thyroid gland exam.  Skin exam.  Screening for damage to your nerves (peripheral neuropathy). This may include checking the pulse in your legs and feet and checking the level of sensation in your hands and feet.  A complete foot exam to inspect the structure and skin of your feet, including checking for cuts, bruises, redness, blisters, sores, or other problems.  Screening for blood vessel (vascular) problems, which may include checking the pulse in your legs and feet and checking your temperature. Blood tests Depending on your treatment plan and your personal needs, you may have the following tests done:  HbA1c (hemoglobin A1c). This  test provides information about blood sugar (glucose) control over the previous 2-3 months. It is used to adjust your treatment plan, if needed. This test will be done: ? At least 2 times a year, if you are meeting your treatment goals. ? 4 times a year, if you are not meeting your treatment goals or if treatment goals have changed.  Lipid testing, including total, LDL, and HDL cholesterol and triglyceride levels. ? The goal for LDL is less than 100 mg/dL (5.5 mmol/L). If you are at high risk for complications, the goal is less than 70 mg/dL (3.9 mmol/L). ? The goal for HDL is 40 mg/dL (2.2 mmol/L) or higher for men and 50 mg/dL (2.8 mmol/L) or higher for women. An HDL cholesterol of 60 mg/dL (3.3 mmol/L) or higher gives some protection against heart disease. ? The goal for triglycerides is less than 150 mg/dL (8.3 mmol/L).  Liver function tests.  Kidney function tests.  Thyroid function tests. Dental and eye exams  Visit your dentist two times a year.  If you have type 1 diabetes, your health care provider may recommend an eye exam 3-5 years after you are diagnosed, and then once a year after your first exam. ? For children with type 1 diabetes, a health care provider may recommend an eye exam when your child is age 10 or older and has had diabetes for 3-5 years. After the first exam, your child should get an eye exam once a year.  If you have type 2 diabetes, your health care provider may recommend an eye exam as soon as you are diagnosed, and then once a year after your first exam. Immunizations   The  yearly flu (influenza) vaccine is recommended for everyone 6 months or older who has diabetes.  The pneumonia (pneumococcal) vaccine is recommended for everyone 2 years or older who has diabetes. If you are 74 or older, you may get the pneumonia vaccine as a series of two separate shots.  The hepatitis B vaccine is recommended for adults shortly after being diagnosed with  diabetes.  Adults and children with diabetes should receive all other vaccines according to age-specific recommendations from the Centers for Disease Control and Prevention (CDC). Mental and emotional health Screening for symptoms of eating disorders, anxiety, and depression is recommended at the time of diagnosis and afterward as needed. If your screening shows that you have symptoms (positive screening result), you may need more evaluation and you may work with a mental health care provider. Treatment plan Your treatment plan will be reviewed at every medical visit. You and your health care provider will discuss:  How you are taking your medicines, including insulin.  Any side effects you are experiencing.  Your blood glucose target goals.  The frequency of your blood glucose monitoring.  Lifestyle habits, such as activity level as well as tobacco, alcohol, and substance use. Diabetes self-management education Your health care provider will assess how well you are monitoring your blood glucose levels and whether you are taking your insulin correctly. He or she may refer you to:  A certified diabetes educator to manage your diabetes throughout your life, starting at diagnosis.  A registered dietitian who can create or review your personal nutrition plan.  An exercise specialist who can discuss your activity level and exercise plan. Summary  Managing diabetes (diabetes mellitus) can be complicated. Your diabetes treatment may be managed by a team of health care providers.  Your health care providers follow guidelines in order to help you get the best quality of care.  Standards of care including having regular physical exams, blood tests, blood pressure monitoring, immunizations, screening tests, and education about how to manage your diabetes.  Your health care providers may also give you more specific instructions based on your individual health. This information is not intended  to replace advice given to you by your health care provider. Make sure you discuss any questions you have with your health care provider. Document Revised: 03/27/2018 Document Reviewed: 04/05/2016 Elsevier Patient Education  [place].

## 2020-07-13 ENCOUNTER — Telehealth: Payer: Self-pay

## 2020-07-13 NOTE — Telephone Encounter (Signed)
I have submitted the PA request for Ubrelvy 100mg  on [facility], Key: [id number].   Awaiting determination from [place] [facility].

## 2020-07-17 NOTE — Telephone Encounter (Signed)
PA approved by [employer] from 07/13/20 to 07/12/21.

## 2020-07-21 ENCOUNTER — Other Ambulatory Visit: Payer: Self-pay | Admitting: Family

## 2020-07-21 DIAGNOSIS — J439 Emphysema, unspecified: Secondary | ICD-10-CM

## 2020-09-11 ENCOUNTER — Encounter: Payer: Self-pay | Admitting: Family

## 2020-09-11 ENCOUNTER — Ambulatory Visit (INDEPENDENT_AMBULATORY_CARE_PROVIDER_SITE_OTHER): Payer: BC Managed Care – PPO | Admitting: Family

## 2020-09-11 VITALS — BP 127/80 | HR 86 | Temp 98.4°F | Ht 74.0 in | Wt 266.6 lb

## 2020-09-11 DIAGNOSIS — G8929 Other chronic pain: Secondary | ICD-10-CM

## 2020-09-11 DIAGNOSIS — Z9861 Coronary angioplasty status: Secondary | ICD-10-CM

## 2020-09-11 DIAGNOSIS — E7800 Pure hypercholesterolemia, unspecified: Secondary | ICD-10-CM

## 2020-09-11 DIAGNOSIS — J438 Other emphysema: Secondary | ICD-10-CM

## 2020-09-11 DIAGNOSIS — I2510 Atherosclerotic heart disease of native coronary artery without angina pectoris: Secondary | ICD-10-CM | POA: Diagnosis not present

## 2020-09-11 DIAGNOSIS — R7989 Other specified abnormal findings of blood chemistry: Secondary | ICD-10-CM

## 2020-09-11 DIAGNOSIS — Z789 Other specified health status: Secondary | ICD-10-CM

## 2020-09-11 DIAGNOSIS — E119 Type 2 diabetes mellitus without complications: Secondary | ICD-10-CM

## 2020-09-11 DIAGNOSIS — M5442 Lumbago with sciatica, left side: Secondary | ICD-10-CM | POA: Diagnosis not present

## 2020-09-11 DIAGNOSIS — Z720 Tobacco use: Secondary | ICD-10-CM

## 2020-09-11 DIAGNOSIS — E669 Obesity, unspecified: Secondary | ICD-10-CM

## 2020-09-11 DIAGNOSIS — F1129 Opioid dependence with unspecified opioid-induced disorder: Secondary | ICD-10-CM

## 2020-09-11 DIAGNOSIS — Z79899 Other long term (current) drug therapy: Secondary | ICD-10-CM

## 2020-09-11 DIAGNOSIS — I10 Essential (primary) hypertension: Secondary | ICD-10-CM

## 2020-09-11 LAB — BAYER DCA HB A1C WAIVED: HB A1C (BAYER DCA - WAIVED): 5.7 % (ref <7.0)

## 2020-09-11 MED ORDER — OXYCODONE HCL 10 MG PO TABS: 10.0000 mg | ORAL_TABLET | Freq: Every day | ORAL | 0 refills | Status: DC | PRN

## 2020-09-11 NOTE — Progress Notes (Signed)
Subjective:    Patient ID: William Carson, male    DOB: Feb 03, 1966, 55 y.o.   MRN: [id number]  Chief Complaint  Patient presents with  . Diabetes   PT presents to the office today for chronic follow up. PT is followed by Cardiologists annually for CAD. Pt has seen Neurologists  for migraines, but states it was not helping and quit.  Diabetes He presents for his follow-up diabetic visit. He has type 2 diabetes mellitus. There are no hypoglycemic associated symptoms. Pertinent negatives for diabetes include no blurred vision and no foot paresthesias. Symptoms are stable. Diabetic complications include heart disease. Pertinent negatives for diabetic complications include no CVA. Risk factors for coronary artery disease include dyslipidemia, diabetes mellitus, male sex, hypertension and sedentary lifestyle. His overall blood glucose range is 110-130 mg/dl. An ACE inhibitor/angiotensin II receptor blocker is being taken. Eye exam is not current.  Hypertension This is a chronic problem. The current episode started more than 1 year ago. The problem has been resolved since onset. The problem is controlled. Associated symptoms include malaise/fatigue. Pertinent negatives include no blurred vision, peripheral edema or shortness of breath. Risk factors for coronary artery disease include dyslipidemia, obesity, male gender, smoking/tobacco exposure and sedentary lifestyle. The current treatment provides moderate improvement. There is no history of CVA or heart failure.  Back Pain This is a chronic problem. The current episode started more than 1 year ago. The problem occurs intermittently. The problem has been waxing and waning since onset. The pain is present in the lumbar spine. The quality of the pain is described as aching. The pain is at a severity of 5/10. The pain is moderate. He has tried analgesics and bed rest for the symptoms. The treatment provided mild relief.  Nicotine Dependence Presents for  follow-up visit. His urge triggers include company of smokers. The symptoms have been stable. He smokes < 1/2 a pack of cigarettes per day. Compliance with prior treatments has been good.  Hyperlipidemia This is a chronic problem. The current episode started more than 1 year ago. The problem is uncontrolled. Exacerbating diseases include obesity. Pertinent negatives include no shortness of breath. Current antihyperlipidemic treatment includes diet change. The current treatment provides mild improvement of lipids.      Review of Systems  Constitutional: Positive for malaise/fatigue.  Eyes: Negative for blurred vision.  Respiratory: Negative for shortness of breath.   Musculoskeletal: Positive for back pain.  All other systems reviewed and are negative.      Objective:   Physical Exam Vitals reviewed.  Constitutional:      General: He is not in acute distress.    Appearance: He is well-developed and well-nourished.  HENT:     Head: Normocephalic.     Right Ear: Tympanic membrane normal.     Left Ear: Tympanic membrane normal.     Mouth/Throat:     Mouth: Oropharynx is clear and moist.  Eyes:     General:        Right eye: No discharge.        Left eye: No discharge.     Pupils: Pupils are equal, round, and reactive to light.  Neck:     Thyroid: No thyromegaly.  Cardiovascular:     Rate and Rhythm: Normal rate and regular rhythm.     Pulses: Intact distal pulses.     Heart sounds: Normal heart sounds. No murmur heard.   Pulmonary:     Effort: Pulmonary effort is normal. No respiratory  distress.     Breath sounds: Rhonchi present. No wheezing.  Abdominal:     General: Bowel sounds are normal. There is no distension.     Palpations: Abdomen is soft.     Tenderness: There is no abdominal tenderness.  Musculoskeletal:        General: Tenderness present. No edema. Normal range of motion.     Cervical back: Normal range of motion and neck supple.     Comments: Pain in lumbar  with flexion  Skin:    General: Skin is warm and dry.     Findings: No erythema or rash.  Neurological:     Mental Status: He is alert and oriented to person, place, and time.     Cranial Nerves: No cranial nerve deficit.     Deep Tendon Reflexes: Reflexes are normal and symmetric.  Psychiatric:        Mood and Affect: Mood and affect normal.        Behavior: Behavior normal.        Thought Content: Thought content normal.        Judgment: Judgment normal.       BP 127/80   Pulse 86   Temp 98.4 F (36.9 C) (Temporal)   Ht '6\' 2"'  (1.88 m)   Wt 266 lb 9.6 oz (120.9 kg)   SpO2 95%   BMI 34.23 kg/m      Assessment & Plan:  William Carson comes in today with chief complaint of Diabetes   Diagnosis and orders addressed:  1. Chronic left-sided low back pain with left-sided sciatica - Oxycodone HCl 10 MG TABS; Take 1 tablet (10 mg total) by mouth daily as needed.  Dispense: 30 tablet; Refill: 0 - Oxycodone HCl 10 MG TABS; Take 1 tablet (10 mg total) by mouth daily as needed.  Dispense: 30 tablet; Refill: 0 - Oxycodone HCl 10 MG TABS; Take 1 tablet (10 mg total) by mouth daily as needed.  Dispense: 30 tablet; Refill: 0 - CMP14+EGFR - CBC with Differential/Platelet  2. Controlled substance agreement signed - Oxycodone HCl 10 MG TABS; Take 1 tablet (10 mg total) by mouth daily as needed.  Dispense: 30 tablet; Refill: 0 - Oxycodone HCl 10 MG TABS; Take 1 tablet (10 mg total) by mouth daily as needed.  Dispense: 30 tablet; Refill: 0 - Oxycodone HCl 10 MG TABS; Take 1 tablet (10 mg total) by mouth daily as needed.  Dispense: 30 tablet; Refill: 0 - CMP14+EGFR - CBC with Differential/Platelet  3. Opioid dependence with opioid-induced disorder (HCC) - Oxycodone HCl 10 MG TABS; Take 1 tablet (10 mg total) by mouth daily as needed.  Dispense: 30 tablet; Refill: 0 - Oxycodone HCl 10 MG TABS; Take 1 tablet (10 mg total) by mouth daily as needed.  Dispense: 30 tablet; Refill: 0 -  Oxycodone HCl 10 MG TABS; Take 1 tablet (10 mg total) by mouth daily as needed.  Dispense: 30 tablet; Refill: 0 - CMP14+EGFR - CBC with Differential/Platelet  4. CAD S/P percutaneous coronary angioplasty - CMP14+EGFR - CBC with Differential/Platelet  5. Essential hypertension - CMP14+EGFR - CBC with Differential/Platelet  6. Other emphysema ([place]) - CMP14+EGFR - CBC with Differential/Platelet  7. Type 2 diabetes mellitus without complication, without long-term current use of insulin (HCC) - Bayer DCA Hb A1c Waived - CMP14+EGFR - CBC with Differential/Platelet  8. Tobacco abuse - CMP14+EGFR - CBC with Differential/Platelet  9. Pure hypercholesterolemia - CMP14+EGFR - CBC with Differential/Platelet - Lipid panel  10. Obesity (BMI 30-39.9) - CMP14+EGFR - CBC with Differential/Platelet  11. Low serum vitamin D - CMP14+EGFR - CBC with Differential/Platelet  12. Statin intolerance - CMP14+EGFR - CBC with Differential/Platelet   Labs pending Patient reviewed in [place] controlled database, no flags noted. Contract and drug screen are up to date. Health Maintenance reviewed Diet and exercise encouraged  Follow up plan: 3 months     [name], FNP

## 2020-09-11 NOTE — Patient Instructions (Signed)

## 2020-09-12 ENCOUNTER — Other Ambulatory Visit: Payer: Self-pay | Admitting: Family

## 2020-09-12 LAB — CMP14+EGFR
ALT: 32 IU/L (ref 0–44)
AST: 19 IU/L (ref 0–40)
Albumin/Globulin Ratio: 2.3 — ABNORMAL HIGH (ref 1.2–2.2)
Albumin: 4.9 g/dL (ref 3.8–4.9)
Alkaline Phosphatase: 47 IU/L (ref 44–121)
BUN/Creatinine Ratio: 10 (ref 9–20)
BUN: 9 mg/dL (ref 6–24)
Bilirubin Total: 0.4 mg/dL (ref 0.0–1.2)
CO2: 21 mmol/L (ref 20–29)
Calcium: 9.7 mg/dL (ref 8.7–10.2)
Chloride: 101 mmol/L (ref 96–106)
Creatinine, Ser: 0.86 mg/dL (ref 0.76–1.27)
GFR calc Af Amer: 113 mL/min/{1.73_m2} (ref >59)
GFR calc non Af Amer: 98 mL/min/{1.73_m2} (ref >59)
Globulin, Total: 2.1 g/dL (ref 1.5–4.5)
Glucose: 61 mg/dL — ABNORMAL LOW (ref 65–99)
Potassium: 4.2 mmol/L (ref 3.5–5.2)
Sodium: 141 mmol/L (ref 134–144)
Total Protein: 7 g/dL (ref 6.0–8.5)

## 2020-09-12 LAB — CBC WITH DIFFERENTIAL/PLATELET
Basophils Absolute: 0.1 10*3/uL (ref 0.0–0.2)
Basos: 1 %
EOS (ABSOLUTE): 0.2 10*3/uL (ref 0.0–0.4)
Eos: 2 %
Hematocrit: 49.3 % (ref 37.5–51.0)
Hemoglobin: 17 g/dL (ref 13.0–17.7)
Immature Grans (Abs): 0.1 10*3/uL (ref 0.0–0.1)
Immature Granulocytes: 1 %
Lymphocytes Absolute: 2.8 10*3/uL (ref 0.7–3.1)
Lymphs: 23 %
MCH: 31.3 pg (ref 26.6–33.0)
MCHC: 34.5 g/dL (ref 31.5–35.7)
MCV: 91 fL (ref 79–97)
Monocytes Absolute: 1.3 10*3/uL — ABNORMAL HIGH (ref 0.1–0.9)
Monocytes: 11 %
Neutrophils Absolute: 7.5 10*3/uL — ABNORMAL HIGH (ref 1.4–7.0)
Neutrophils: 62 %
Platelets: 311 10*3/uL (ref 150–450)
RBC: 5.44 x10E6/uL (ref 4.14–5.80)
RDW: 13.1 % (ref 11.6–15.4)
WBC: 11.9 10*3/uL — ABNORMAL HIGH (ref 3.4–10.8)

## 2020-09-12 LAB — LIPID PANEL
Chol/HDL Ratio: 5.9 ratio — ABNORMAL HIGH (ref 0.0–5.0)
Cholesterol, Total: 200 mg/dL — ABNORMAL HIGH (ref 100–199)
HDL: 34 mg/dL — ABNORMAL LOW (ref >39)
LDL Chol Calc (NIH): 142 mg/dL — ABNORMAL HIGH (ref 0–99)
Triglycerides: 132 mg/dL (ref 0–149)
VLDL Cholesterol Cal: 24 mg/dL (ref 5–40)

## 2020-10-04 ENCOUNTER — Other Ambulatory Visit: Payer: Self-pay | Admitting: Family

## 2020-10-04 DIAGNOSIS — I10 Essential (primary) hypertension: Secondary | ICD-10-CM

## 2020-11-18 ENCOUNTER — Other Ambulatory Visit: Payer: Self-pay | Admitting: Family

## 2020-11-18 DIAGNOSIS — J439 Emphysema, unspecified: Secondary | ICD-10-CM

## 2020-11-29 ENCOUNTER — Encounter: Payer: Self-pay | Admitting: Family Medicine

## 2020-12-11 ENCOUNTER — Other Ambulatory Visit: Payer: Self-pay

## 2020-12-11 ENCOUNTER — Ambulatory Visit (INDEPENDENT_AMBULATORY_CARE_PROVIDER_SITE_OTHER): Payer: BC Managed Care – PPO | Admitting: Family

## 2020-12-11 ENCOUNTER — Encounter: Payer: Self-pay | Admitting: Family

## 2020-12-11 VITALS — BP 117/68 | HR 94 | Temp 97.8°F | Ht 74.0 in | Wt 258.0 lb

## 2020-12-11 DIAGNOSIS — R7989 Other specified abnormal findings of blood chemistry: Secondary | ICD-10-CM

## 2020-12-11 DIAGNOSIS — Z789 Other specified health status: Secondary | ICD-10-CM

## 2020-12-11 DIAGNOSIS — Z720 Tobacco use: Secondary | ICD-10-CM

## 2020-12-11 DIAGNOSIS — G8929 Other chronic pain: Secondary | ICD-10-CM

## 2020-12-11 DIAGNOSIS — I2510 Atherosclerotic heart disease of native coronary artery without angina pectoris: Secondary | ICD-10-CM

## 2020-12-11 DIAGNOSIS — Z9861 Coronary angioplasty status: Secondary | ICD-10-CM

## 2020-12-11 DIAGNOSIS — I10 Essential (primary) hypertension: Secondary | ICD-10-CM | POA: Diagnosis not present

## 2020-12-11 DIAGNOSIS — E119 Type 2 diabetes mellitus without complications: Secondary | ICD-10-CM

## 2020-12-11 DIAGNOSIS — M5442 Lumbago with sciatica, left side: Secondary | ICD-10-CM | POA: Diagnosis not present

## 2020-12-11 DIAGNOSIS — J438 Other emphysema: Secondary | ICD-10-CM

## 2020-12-11 DIAGNOSIS — Z79899 Other long term (current) drug therapy: Secondary | ICD-10-CM

## 2020-12-11 DIAGNOSIS — E7800 Pure hypercholesterolemia, unspecified: Secondary | ICD-10-CM

## 2020-12-11 DIAGNOSIS — E669 Obesity, unspecified: Secondary | ICD-10-CM

## 2020-12-11 DIAGNOSIS — F1129 Opioid dependence with unspecified opioid-induced disorder: Secondary | ICD-10-CM

## 2020-12-11 MED ORDER — OXYCODONE HCL 10 MG PO TABS: 10.0000 mg | ORAL_TABLET | Freq: Every day | ORAL | 0 refills | Status: DC | PRN

## 2020-12-11 NOTE — Progress Notes (Signed)
Subjective:    Patient ID: William Carson, male    DOB: Feb 12, 1966, 55 y.o.   MRN: [id number]  Chief Complaint  Patient presents with  . Diabetes   PT presents to the office todayfor chronic follow up. PT is followed by Cardiologists annually for CAD. Pthas seenNeurologists for migraines, but states it was not helping and quit. Diabetes He presents for his follow-up diabetic visit. He has type 2 diabetes mellitus. His disease course has been stable. There are no hypoglycemic associated symptoms. Pertinent negatives for diabetes include no blurred vision and no foot paresthesias. Symptoms are stable. Diabetic complications include heart disease. Risk factors for coronary artery disease include dyslipidemia, diabetes mellitus, male sex, hypertension and sedentary lifestyle. He is following a generally unhealthy diet. (Does not check at home) Eye exam is not current.  Hypertension This is a chronic problem. The current episode started more than 1 year ago. The problem has been waxing and waning since onset. The problem is uncontrolled. Associated symptoms include malaise/fatigue and shortness of breath. Pertinent negatives include no blurred vision or peripheral edema. Risk factors for coronary artery disease include dyslipidemia, diabetes mellitus, obesity, male gender, sedentary lifestyle and smoking/tobacco exposure. The current treatment provides moderate improvement.  Back Pain This is a chronic problem. The current episode started more than 1 year ago. The problem occurs intermittently. The problem has been waxing and waning since onset. The pain is present in the lumbar spine. The pain is at a severity of 4/10. The pain is moderate.  Nicotine Dependence Presents for follow-up visit. His urge triggers include company of smokers. The symptoms have been stable. He smokes 1 pack of cigarettes per day.  Hyperlipidemia This is a chronic problem. The current episode started more than 1 year  ago. Exacerbating diseases include obesity. Associated symptoms include shortness of breath. The current treatment provides moderate improvement of lipids. Risk factors for coronary artery disease include dyslipidemia, male sex, hypertension and a sedentary lifestyle.   COPD Breathing stable. Continues to smoke a pack a day.    Review of Systems  Constitutional: Positive for malaise/fatigue.  Eyes: Negative for blurred vision.  Respiratory: Positive for shortness of breath.   Musculoskeletal: Positive for back pain.  All other systems reviewed and are negative.      Objective:   Physical Exam Vitals reviewed.  Constitutional:      General: He is not in acute distress.    Appearance: He is well-developed. He is obese.  HENT:     Head: Normocephalic.     Right Ear: Tympanic membrane normal.     Left Ear: Tympanic membrane normal.  Eyes:     General:        Right eye: No discharge.        Left eye: No discharge.     Pupils: Pupils are equal, round, and reactive to light.  Neck:     Thyroid: No thyromegaly.  Cardiovascular:     Rate and Rhythm: Normal rate and regular rhythm.     Heart sounds: Normal heart sounds. No murmur heard.   Pulmonary:     Effort: Pulmonary effort is normal. No respiratory distress.     Breath sounds: Wheezing present.  Abdominal:     General: Bowel sounds are normal. There is no distension.     Palpations: Abdomen is soft.     Tenderness: There is no abdominal tenderness.  Musculoskeletal:        General: No tenderness. Normal range of  motion.     Cervical back: Normal range of motion and neck supple.  Skin:    General: Skin is warm and dry.     Findings: No erythema or rash.  Neurological:     Mental Status: He is alert and oriented to person, place, and time.     Cranial Nerves: No cranial nerve deficit.     Deep Tendon Reflexes: Reflexes are normal and symmetric.  Psychiatric:        Behavior: Behavior normal.        Thought Content:  Thought content normal.        Judgment: Judgment normal.       BP (!) 146/84   Pulse (!) 103   Temp 97.8 F (36.6 C) (Temporal)   Ht 6\' 2"  (1.88 m)   Wt 258 lb (117 kg)   BMI 33.13 kg/m      Assessment & Plan:  FLOY RIEGLER comes in today with chief complaint of Diabetes   Diagnosis and orders addressed:  1. Chronic left-sided low back pain with left-sided sciatica - Oxycodone HCl 10 MG TABS; Take 1 tablet (10 mg total) by mouth daily as needed.  Dispense: 30 tablet; Refill: 0 - Oxycodone HCl 10 MG TABS; Take 1 tablet (10 mg total) by mouth daily as needed.  Dispense: 30 tablet; Refill: 0 - Oxycodone HCl 10 MG TABS; Take 1 tablet (10 mg total) by mouth daily as needed.  Dispense: 30 tablet; Refill: 0  2. Controlled substance agreement signed - Oxycodone HCl 10 MG TABS; Take 1 tablet (10 mg total) by mouth daily as needed.  Dispense: 30 tablet; Refill: 0 - Oxycodone HCl 10 MG TABS; Take 1 tablet (10 mg total) by mouth daily as needed.  Dispense: 30 tablet; Refill: 0 - Oxycodone HCl 10 MG TABS; Take 1 tablet (10 mg total) by mouth daily as needed.  Dispense: 30 tablet; Refill: 0  3. Opioid dependence with opioid-induced disorder (HCC) - Oxycodone HCl 10 MG TABS; Take 1 tablet (10 mg total) by mouth daily as needed.  Dispense: 30 tablet; Refill: 0 - Oxycodone HCl 10 MG TABS; Take 1 tablet (10 mg total) by mouth daily as needed.  Dispense: 30 tablet; Refill: 0 - Oxycodone HCl 10 MG TABS; Take 1 tablet (10 mg total) by mouth daily as needed.  Dispense: 30 tablet; Refill: 0  4. Essential hypertension  5. CAD S/P percutaneous coronary angioplasty  6. Other emphysema ([place])   7. Type 2 diabetes mellitus without complication, without long-term current use of insulin ([place])  8. Tobacco abuse  9. Statin intolerance  10. Obesity (BMI 30-39.9)  11. Low serum vitamin D  12. Pure hypercholesterolemia    Labs reviewed  Health Maintenance reviewed Diet and exercise  encouraged  Follow up plan: 3  Months    [name], FNP

## 2020-12-11 NOTE — Patient Instructions (Signed)

## 2020-12-28 ENCOUNTER — Other Ambulatory Visit: Payer: Self-pay | Admitting: Family

## 2020-12-28 DIAGNOSIS — J439 Emphysema, unspecified: Secondary | ICD-10-CM

## 2020-12-28 DIAGNOSIS — I10 Essential (primary) hypertension: Secondary | ICD-10-CM

## 2021-02-12 ENCOUNTER — Other Ambulatory Visit: Payer: Self-pay | Admitting: Family

## 2021-03-09 ENCOUNTER — Ambulatory Visit (INDEPENDENT_AMBULATORY_CARE_PROVIDER_SITE_OTHER): Payer: BC Managed Care – PPO | Admitting: Family

## 2021-03-09 ENCOUNTER — Other Ambulatory Visit: Payer: Self-pay

## 2021-03-09 ENCOUNTER — Encounter: Payer: Self-pay | Admitting: Family

## 2021-03-09 VITALS — BP 134/80 | HR 64 | Temp 97.6°F | Ht 74.0 in | Wt 256.8 lb

## 2021-03-09 DIAGNOSIS — M5442 Lumbago with sciatica, left side: Secondary | ICD-10-CM | POA: Diagnosis not present

## 2021-03-09 DIAGNOSIS — G8929 Other chronic pain: Secondary | ICD-10-CM | POA: Diagnosis not present

## 2021-03-09 DIAGNOSIS — E119 Type 2 diabetes mellitus without complications: Secondary | ICD-10-CM

## 2021-03-09 DIAGNOSIS — F1129 Opioid dependence with unspecified opioid-induced disorder: Secondary | ICD-10-CM | POA: Diagnosis not present

## 2021-03-09 DIAGNOSIS — Z23 Encounter for immunization: Secondary | ICD-10-CM

## 2021-03-09 DIAGNOSIS — Z720 Tobacco use: Secondary | ICD-10-CM

## 2021-03-09 DIAGNOSIS — J439 Emphysema, unspecified: Secondary | ICD-10-CM

## 2021-03-09 DIAGNOSIS — I10 Essential (primary) hypertension: Secondary | ICD-10-CM

## 2021-03-09 DIAGNOSIS — I2510 Atherosclerotic heart disease of native coronary artery without angina pectoris: Secondary | ICD-10-CM | POA: Diagnosis not present

## 2021-03-09 DIAGNOSIS — E669 Obesity, unspecified: Secondary | ICD-10-CM

## 2021-03-09 DIAGNOSIS — Z79899 Other long term (current) drug therapy: Secondary | ICD-10-CM

## 2021-03-09 DIAGNOSIS — Z9861 Coronary angioplasty status: Secondary | ICD-10-CM | POA: Diagnosis not present

## 2021-03-09 DIAGNOSIS — E7800 Pure hypercholesterolemia, unspecified: Secondary | ICD-10-CM

## 2021-03-09 LAB — BAYER DCA HB A1C WAIVED: HB A1C (BAYER DCA - WAIVED): 5.9 % (ref <7.0)

## 2021-03-09 MED ORDER — OXYCODONE HCL 10 MG PO TABS: 10.0000 mg | ORAL_TABLET | Freq: Every day | ORAL | 0 refills | Status: DC | PRN

## 2021-03-09 MED ORDER — SYMBICORT 160-4.5 MCG/ACT IN AERO: 2.0000 | INHALATION_SPRAY | Freq: Two times a day (BID) | RESPIRATORY_TRACT | 1 refills | Status: DC

## 2021-03-09 MED ORDER — UBRELVY 100 MG PO TABS: 100.0000 mg | ORAL_TABLET | Freq: Two times a day (BID) | ORAL | 3 refills | Status: DC | PRN

## 2021-03-09 MED ORDER — ONETOUCH DELICA LANCETS 33G MISC: 2 refills | Status: DC

## 2021-03-09 MED ORDER — ONETOUCH VERIO VI STRP: ORAL_STRIP | 2 refills | Status: DC

## 2021-03-09 MED ORDER — METFORMIN HCL ER 500 MG PO TB24: 500.0000 mg | ORAL_TABLET | Freq: Every day | ORAL | 2 refills | Status: DC

## 2021-03-09 MED ORDER — GABAPENTIN 100 MG PO CAPS: 100.0000 mg | ORAL_CAPSULE | Freq: Three times a day (TID) | ORAL | 3 refills | Status: DC

## 2021-03-09 MED ORDER — LISINOPRIL 10 MG PO TABS: 10.0000 mg | ORAL_TABLET | Freq: Every day | ORAL | 3 refills | Status: DC

## 2021-03-09 MED ORDER — AMLODIPINE BESYLATE 5 MG PO TABS: 5.0000 mg | ORAL_TABLET | Freq: Every day | ORAL | 1 refills | Status: DC

## 2021-03-09 NOTE — Patient Instructions (Signed)
Managing the Challenge of Quitting Smoking Quitting smoking is a physical and mental challenge. You will face cravings, withdrawal symptoms, and temptation. Before quitting, work with your health care provider to make a plan that can help you manage quitting. Preparation canhelp you quit and keep you from giving in. How to manage lifestyle changes Managing stress Stress can make you want to smoke, and wanting to smoke may cause stress. It is important to find ways to manage your stress. You might try some of the following: Practice relaxation techniques. Breathe slowly and deeply, in through your nose and out through your mouth. Listen to music. Soak in a bath or take a shower. Imagine a peaceful place or vacation. Get some support. Talk with family or friends about your stress. Join a support group. Talk with a counselor or therapist. Get some physical activity. Go for a walk, run, or bike ride. Play a favorite sport. Practice yoga.  Medicines Talk with your health care provider about medicines that might help you dealwith cravings and make quitting easier for you. Relationships Social situations can be difficult when you are quitting smoking. To manage this, you can: Avoid parties and other social situations where people might be smoking. Avoid alcohol. Leave right away if you have the urge to smoke. Explain to your family and friends that you are quitting smoking. Ask for support and let them know you might be a bit grumpy. Plan activities where smoking is not an option. General instructions Be aware that many people gain weight after they quit smoking. However, not everyone does. To keep from gaining weight, have a plan in place before you quit and stick to the plan after you quit. Your plan should include: Having healthy snacks. When you have a craving, it may help to: Eat popcorn, carrots, celery, or other cut vegetables. Chew sugar-free gum. Changing how you eat. Eat small  portion sizes at meals. Eat 4-6 small meals throughout the day instead of 1-2 large meals a day. Be mindful when you eat. Do not watch television or do other things that might distract you as you eat. Exercising regularly. Make time to exercise each day. If you do not have time for a long workout, do short bouts of exercise for 5-10 minutes several times a day. Do some form of strengthening exercise, such as weight lifting. Do some exercise that gets your heart beating and causes you to breathe deeply, such as walking fast, running, swimming, or biking. This is very important. Drinking plenty of water or other low-calorie or no-calorie drinks. Drink 6-8 glasses of water daily.  How to recognize withdrawal symptoms Your body and mind may experience discomfort as you try to get used to not having nicotine in your system. These effects are called withdrawal symptoms. They may include: Feeling hungrier than normal. Having trouble concentrating. Feeling irritable or restless. Having trouble sleeping. Feeling depressed. Craving a cigarette. To manage withdrawal symptoms: Avoid places, people, and activities that trigger your cravings. Remember why you want to quit. Get plenty of sleep. Avoid coffee and other caffeinated drinks. These may worsen some of your symptoms. These symptoms may surprise you. But be assured that they are normal to havewhen quitting smoking. How to manage cravings Come up with a plan for how to deal with your cravings. The plan should include the following: A definition of the specific situation you want to deal with. An alternative action you will take. A clear idea for how this action will help. The   name of someone who might help you with this. Cravings usually last for 5-10 minutes. Consider taking the following actions to help you with your plan to deal with cravings: Keep your mouth busy. Chew sugar-free gum. Suck on hard candies or a straw. Brush your  teeth. Keep your hands and body busy. Change to a different activity right away. Squeeze or play with a ball. Do an activity or a hobby, such as making bead jewelry, practicing needlepoint, or working with wood. Mix up your normal routine. Take a short exercise break. Go for a quick walk or run up and down stairs. Focus on doing something kind or helpful for someone else. Call a friend or family member to talk during a craving. Join a support group. Contact a quitline. Where to find support To get help or find a support group: Call the National Cancer Institute's Smoking Quitline: 1-800-QUIT NOW (784-8669) Visit the website of the Substance Abuse and Mental Health Services Administration: www.samhsa.gov Text QUIT to SmokefreeTXT: 478848 Where to find more information Visit these websites to find more information on quitting smoking: National Cancer Institute: www.smokefree.gov American Lung Association: www.lung.org American Cancer Society: www.cancer.org Centers for Disease Control and Prevention: www.cdc.gov American Heart Association: www.heart.org Contact a health care provider if: You want to change your plan for quitting. The medicines you are taking are not helping. Your eating feels out of control or you cannot sleep. Get help right away if: You feel depressed or become very anxious. Summary Quitting smoking is a physical and mental challenge. You will face cravings, withdrawal symptoms, and temptation to smoke again. Preparation can help you as you go through these challenges. Try different techniques to manage stress, handle social situations, and prevent weight gain. You can deal with cravings by keeping your mouth busy (such as by chewing gum), keeping your hands and body busy, calling family or friends, or contacting a quitline for people who want to quit smoking. You can deal with withdrawal symptoms by avoiding places where people smoke, getting plenty of rest, and  avoiding drinks with caffeine. This information is not intended to replace advice given to you by your health care provider. Make sure you discuss any questions you have with your healthcare provider. Document Revised: 04/27/2019 Document Reviewed: 04/27/2019 Elsevier Patient Education  2022 Elsevier Inc.  

## 2021-03-09 NOTE — Progress Notes (Signed)
Subjective:    Patient ID: William Carson, male    DOB: 28-Mar-1966, 55 y.o.   MRN: [id number]  Chief Complaint  Patient presents with   Medical Management of Chronic Issues   PT presents to the office today for chronic follow up. PT was followed by Cardiologists annually for CAD. States he has not seen them since COVID.  Hypertension This is a chronic problem. The current episode started more than 1 year ago. The problem has been resolved since onset. The problem is controlled. Pertinent negatives include no blurred vision, malaise/fatigue, peripheral edema or shortness of breath. Risk factors for coronary artery disease include dyslipidemia, diabetes mellitus, obesity and male gender. The current treatment provides moderate improvement.  Nicotine Dependence Presents for follow-up visit. His urge triggers include company of smokers. The symptoms have been improving. He smokes < 1/2 a pack (trying to quit, reports one pack has last him several weeks) of cigarettes per day.  Diabetes He presents for his follow-up diabetic visit. He has type 2 diabetes mellitus. Pertinent negatives for diabetes include no blurred vision, no foot paresthesias and no weakness. Symptoms are stable. Diabetic complications include heart disease. Risk factors for coronary artery disease include dyslipidemia, diabetes mellitus, male sex, hypertension and sedentary lifestyle. He is following a generally healthy diet. (Does not check BS at home) An ACE inhibitor/angiotensin II receptor blocker is being taken. Eye exam is not current.  Hyperlipidemia This is a chronic problem. The current episode started more than 1 year ago. Exacerbating diseases include obesity. Pertinent negatives include no shortness of breath. Current antihyperlipidemic treatment includes statins. The current treatment provides moderate improvement of lipids. Risk factors for coronary artery disease include diabetes mellitus, dyslipidemia, male sex,  hypertension and a sedentary lifestyle.  Back Pain This is a chronic problem. The current episode started more than 1 year ago. The problem occurs intermittently. The problem has been waxing and waning since onset. The pain is present in the lumbar spine. The pain is at a severity of 6/10. The pain is moderate. Pertinent negatives include no tingling or weakness. Risk factors include obesity. He has tried analgesics for the symptoms. The treatment provided moderate relief.  COPD Pt trying to quit smoking. States his breathing is stable, worse during hot weather.     Review of Systems  Constitutional:  Negative for malaise/fatigue.  Eyes:  Negative for blurred vision.  Respiratory:  Negative for shortness of breath.   Musculoskeletal:  Positive for back pain.  Neurological:  Negative for tingling and weakness.  All other systems reviewed and are negative.     Objective:   Physical Exam Vitals reviewed.  Constitutional:      General: He is not in acute distress.    Appearance: He is well-developed. He is obese.  HENT:     Head: Normocephalic.     Right Ear: Tympanic membrane normal.     Left Ear: Tympanic membrane normal.  Eyes:     General:        Right eye: No discharge.        Left eye: No discharge.     Pupils: Pupils are equal, round, and reactive to light.  Neck:     Thyroid: No thyromegaly.  Cardiovascular:     Rate and Rhythm: Normal rate and regular rhythm.     Heart sounds: Normal heart sounds. No murmur heard. Pulmonary:     Effort: Pulmonary effort is normal. No respiratory distress.     Breath sounds:  Rhonchi present. No wheezing.  Abdominal:     General: Bowel sounds are normal. There is no distension.     Palpations: Abdomen is soft.     Tenderness: There is no abdominal tenderness.  Musculoskeletal:        General: No tenderness. Normal range of motion.     Cervical back: Normal range of motion and neck supple.  Skin:    General: Skin is warm and dry.      Findings: No erythema or rash.  Neurological:     Mental Status: He is alert and oriented to person, place, and time.     Cranial Nerves: No cranial nerve deficit.     Deep Tendon Reflexes: Reflexes are normal and symmetric.  Psychiatric:        Behavior: Behavior normal.        Thought Content: Thought content normal.        Judgment: Judgment normal.    BP 134/80   Pulse 64   Temp 97.6 F (36.4 C) (Temporal)   Ht '6\' 2"'  (1.88 m)   Wt 256 lb 12.8 oz (116.5 kg)   BMI 32.97 kg/m      Assessment & Plan:  William Carson comes in today with chief complaint of Medical Management of Chronic Issues   Diagnosis and orders addressed:  1. Chronic left-sided low back pain with left-sided sciatica - Oxycodone HCl 10 MG TABS; Take 1 tablet (10 mg total) by mouth daily as needed.  Dispense: 30 tablet; Refill: 0 - Oxycodone HCl 10 MG TABS; Take 1 tablet (10 mg total) by mouth daily as needed.  Dispense: 30 tablet; Refill: 0 - Oxycodone HCl 10 MG TABS; Take 1 tablet (10 mg total) by mouth daily as needed.  Dispense: 30 tablet; Refill: 0 - CMP14+EGFR  2. Controlled substance agreement signed - Oxycodone HCl 10 MG TABS; Take 1 tablet (10 mg total) by mouth daily as needed.  Dispense: 30 tablet; Refill: 0 - Oxycodone HCl 10 MG TABS; Take 1 tablet (10 mg total) by mouth daily as needed.  Dispense: 30 tablet; Refill: 0 - Oxycodone HCl 10 MG TABS; Take 1 tablet (10 mg total) by mouth daily as needed.  Dispense: 30 tablet; Refill: 0 - CMP14+EGFR  3. Opioid dependence with opioid-induced disorder (HCC) - Oxycodone HCl 10 MG TABS; Take 1 tablet (10 mg total) by mouth daily as needed.  Dispense: 30 tablet; Refill: 0 - Oxycodone HCl 10 MG TABS; Take 1 tablet (10 mg total) by mouth daily as needed.  Dispense: 30 tablet; Refill: 0 - Oxycodone HCl 10 MG TABS; Take 1 tablet (10 mg total) by mouth daily as needed.  Dispense: 30 tablet; Refill: 0 - CMP14+EGFR  4. Essential hypertension  - amLODipine  (NORVASC) 5 MG tablet; Take 1 tablet (5 mg total) by mouth daily.  Dispense: 90 tablet; Refill: 1 - lisinopril (ZESTRIL) 10 MG tablet; Take 1 tablet (10 mg total) by mouth daily.  Dispense: 90 tablet; Refill: 3 - CMP14+EGFR  5. Type 2 diabetes mellitus without complication, without long-term current use of insulin (HCC) - lisinopril (ZESTRIL) 10 MG tablet; Take 1 tablet (10 mg total) by mouth daily.  Dispense: 90 tablet; Refill: 3 - Bayer DCA Hb A1c Waived - CMP14+EGFR  6. Pulmonary emphysema, unspecified emphysema type ([place]) - SYMBICORT 160-4.5 MCG/ACT inhaler; Inhale 2 puffs into the lungs 2 (two) times daily.  Dispense: 10.2 g; Refill: 1 - CMP14+EGFR  7. CAD S/P percutaneous coronary angioplasty - CMP14+EGFR  8. Pure hypercholesterolemia - CMP14+EGFR  9. Tobacco abuse - CMP14+EGFR  10. Obesity (BMI 30-39.9) - CMP14+EGFR   Labs pending Patient reviewed in [place] controlled database, no flags noted. Contract and drug screen are up to date.  Health Maintenance reviewed Diet and exercise encouraged  Follow up plan: 3 months    [name], FNP

## 2021-03-10 LAB — CMP14+EGFR
ALT: 34 IU/L (ref 0–44)
AST: 24 IU/L (ref 0–40)
Albumin/Globulin Ratio: 2 (ref 1.2–2.2)
Albumin: 4.6 g/dL (ref 3.8–4.9)
Alkaline Phosphatase: 44 IU/L (ref 44–121)
BUN/Creatinine Ratio: 15 (ref 9–20)
BUN: 11 mg/dL (ref 6–24)
Bilirubin Total: 0.5 mg/dL (ref 0.0–1.2)
CO2: 19 mmol/L — ABNORMAL LOW (ref 20–29)
Calcium: 9.6 mg/dL (ref 8.7–10.2)
Chloride: 102 mmol/L (ref 96–106)
Creatinine, Ser: 0.75 mg/dL — ABNORMAL LOW (ref 0.76–1.27)
Globulin, Total: 2.3 g/dL (ref 1.5–4.5)
Glucose: 95 mg/dL (ref 65–99)
Potassium: 5 mmol/L (ref 3.5–5.2)
Sodium: 139 mmol/L (ref 134–144)
Total Protein: 6.9 g/dL (ref 6.0–8.5)
eGFR: 106 mL/min/{1.73_m2} (ref >59)

## 2021-06-11 ENCOUNTER — Telehealth: Payer: Self-pay | Admitting: Family Medicine

## 2021-06-11 ENCOUNTER — Other Ambulatory Visit: Payer: Self-pay

## 2021-06-11 ENCOUNTER — Ambulatory Visit: Payer: BC Managed Care – PPO | Admitting: Family

## 2021-06-11 ENCOUNTER — Encounter: Payer: Self-pay | Admitting: Family

## 2021-06-11 VITALS — BP 127/64 | HR 82 | Temp 97.8°F | Ht 74.0 in | Wt 260.4 lb

## 2021-06-11 DIAGNOSIS — I2510 Atherosclerotic heart disease of native coronary artery without angina pectoris: Secondary | ICD-10-CM | POA: Diagnosis not present

## 2021-06-11 DIAGNOSIS — I10 Essential (primary) hypertension: Secondary | ICD-10-CM

## 2021-06-11 DIAGNOSIS — J438 Other emphysema: Secondary | ICD-10-CM

## 2021-06-11 DIAGNOSIS — Z79899 Other long term (current) drug therapy: Secondary | ICD-10-CM | POA: Diagnosis not present

## 2021-06-11 DIAGNOSIS — E7800 Pure hypercholesterolemia, unspecified: Secondary | ICD-10-CM

## 2021-06-11 DIAGNOSIS — E119 Type 2 diabetes mellitus without complications: Secondary | ICD-10-CM

## 2021-06-11 DIAGNOSIS — E559 Vitamin D deficiency, unspecified: Secondary | ICD-10-CM

## 2021-06-11 DIAGNOSIS — Z720 Tobacco use: Secondary | ICD-10-CM

## 2021-06-11 DIAGNOSIS — G8929 Other chronic pain: Secondary | ICD-10-CM

## 2021-06-11 DIAGNOSIS — M5442 Lumbago with sciatica, left side: Secondary | ICD-10-CM

## 2021-06-11 DIAGNOSIS — Z9861 Coronary angioplasty status: Secondary | ICD-10-CM

## 2021-06-11 DIAGNOSIS — Z23 Encounter for immunization: Secondary | ICD-10-CM

## 2021-06-11 DIAGNOSIS — E669 Obesity, unspecified: Secondary | ICD-10-CM

## 2021-06-11 DIAGNOSIS — F1129 Opioid dependence with unspecified opioid-induced disorder: Secondary | ICD-10-CM

## 2021-06-11 MED ORDER — OXYCODONE-ACETAMINOPHEN 10-325 MG PO TABS: 1.0000 | ORAL_TABLET | ORAL | 0 refills | Status: DC | PRN

## 2021-06-11 MED ORDER — DOXYCYCLINE HYCLATE 100 MG PO TABS: 100.0000 mg | ORAL_TABLET | Freq: Two times a day (BID) | ORAL | 0 refills | Status: DC

## 2021-06-11 MED ORDER — OXYCODONE HCL 10 MG PO TABS: 10.0000 mg | ORAL_TABLET | Freq: Every day | ORAL | 0 refills | Status: DC | PRN

## 2021-06-11 NOTE — Patient Instructions (Signed)

## 2021-06-11 NOTE — Telephone Encounter (Signed)
[facility] called back

## 2021-06-11 NOTE — Progress Notes (Signed)
Subjective:    Patient ID: William Carson, male    DOB: 07-Sep-1965, 55 y.o.   MRN: [id number]  Chief Complaint  Patient presents with   Medical Management of Chronic Issues   PT presents to the office today for chronic follow up. PT was followed by Cardiologists annually for CAD. States he has not seen them since COVID.   He has COPD and states his breathing is stable. He continues to smoke 1 pack day.  Hypertension This is a chronic problem. The current episode started more than 1 year ago. The problem has been resolved since onset. The problem is controlled. Pertinent negatives include no anxiety, blurred vision, malaise/fatigue, peripheral edema or shortness of breath. Risk factors for coronary artery disease include dyslipidemia, obesity and male gender. The current treatment provides moderate improvement.  Diabetes He presents for his follow-up diabetic visit. He has type 2 diabetes mellitus. There are no hypoglycemic associated symptoms. Pertinent negatives for diabetes include no blurred vision and no foot paresthesias. Symptoms are stable. Diabetic complications include heart disease. Pertinent negatives for diabetic complications include no peripheral neuropathy. Risk factors for coronary artery disease include dyslipidemia, diabetes mellitus, male sex, hypertension and sedentary lifestyle. He is following a generally unhealthy diet. His overall blood glucose range is 130-140 mg/dl. An ACE inhibitor/angiotensin II receptor blocker is being taken.  Nicotine Dependence Presents for follow-up visit. His urge triggers include company of smokers. The symptoms have been stable. He smokes 1 pack of cigarettes per day.  Back Pain This is a chronic problem. The current episode started more than 1 year ago. The problem occurs intermittently. The pain is present in the lumbar spine. The quality of the pain is described as aching. The pain is at a severity of 7/10. The pain is moderate. Risk  factors include obesity. He has tried analgesics for the symptoms. The treatment provided mild relief.     Review of Systems  Constitutional:  Negative for malaise/fatigue.  Eyes:  Negative for blurred vision.  Respiratory:  Negative for shortness of breath.   Musculoskeletal:  Positive for back pain.  All other systems reviewed and are negative.     Objective:   Physical Exam Vitals reviewed.  Constitutional:      General: He is not in acute distress.    Appearance: He is well-developed. He is obese.  HENT:     Head: Normocephalic.     Right Ear: Tympanic membrane normal.     Left Ear: Tympanic membrane normal.  Eyes:     General:        Right eye: No discharge.        Left eye: No discharge.     Pupils: Pupils are equal, round, and reactive to light.  Neck:     Thyroid: No thyromegaly.  Cardiovascular:     Rate and Rhythm: Normal rate and regular rhythm.     Heart sounds: Normal heart sounds. No murmur heard. Pulmonary:     Effort: Pulmonary effort is normal. No respiratory distress.     Breath sounds: Rhonchi present. No wheezing.  Abdominal:     General: Bowel sounds are normal. There is no distension.     Palpations: Abdomen is soft.     Tenderness: There is no abdominal tenderness.  Musculoskeletal:        General: No tenderness. Normal range of motion.     Cervical back: Normal range of motion and neck supple.  Skin:    General: Skin is  warm and dry.     Findings: No erythema or rash.  Neurological:     Mental Status: He is alert and oriented to person, place, and time.     Cranial Nerves: No cranial nerve deficit.     Deep Tendon Reflexes: Reflexes are normal and symmetric.  Psychiatric:        Behavior: Behavior normal.        Thought Content: Thought content normal.        Judgment: Judgment normal.      BP 127/64   Pulse 82   Temp 97.8 F (36.6 C) (Temporal)   Ht 6\' 2"  (1.88 m)   Wt 260 lb 6.4 oz (118.1 kg)   BMI 33.43 kg/m      Assessment  & Plan:  William Carson comes in today with chief complaint of Medical Management of Chronic Issues   Diagnosis and orders addressed:  1. Chronic left-sided low back pain with left-sided sciatica - Oxycodone HCl 10 MG TABS; Take 1 tablet (10 mg total) by mouth daily as needed.  Dispense: 30 tablet; Refill: 0 - Oxycodone HCl 10 MG TABS; Take 1 tablet (10 mg total) by mouth daily as needed.  Dispense: 30 tablet; Refill: 0  2. Controlled substance agreement signed - Oxycodone HCl 10 MG TABS; Take 1 tablet (10 mg total) by mouth daily as needed.  Dispense: 30 tablet; Refill: 0 - Oxycodone HCl 10 MG TABS; Take 1 tablet (10 mg total) by mouth daily as needed.  Dispense: 30 tablet; Refill: 0  3. Opioid dependence with opioid-induced disorder (HCC) - Oxycodone HCl 10 MG TABS; Take 1 tablet (10 mg total) by mouth daily as needed.  Dispense: 30 tablet; Refill: 0 - Oxycodone HCl 10 MG TABS; Take 1 tablet (10 mg total) by mouth daily as needed.  Dispense: 30 tablet; Refill: 0  4. Need for immunization against influenza - Flu Vaccine QUAD 52mo+IM (Fluarix, Fluzone & Alfiuria Quad PF)  5. CAD S/P percutaneous coronary angioplasty  6. Essential hypertension  7. Other emphysema ([place])  8. Type 2 diabetes mellitus without complication, without long-term current use of insulin ([place])  9. Pure hypercholesterolemia  10. Tobacco abuse  11. Vitamin D deficiency  12. Obesity (BMI 30-39.9)    Patient reviewed in [place] controlled database, no flags noted.  Health Maintenance reviewed Diet and exercise encouraged  Follow up plan: 3 months   William Dun, FNP

## 2021-07-18 ENCOUNTER — Other Ambulatory Visit: Payer: Self-pay | Admitting: Family

## 2021-07-18 DIAGNOSIS — J439 Emphysema, unspecified: Secondary | ICD-10-CM

## 2021-09-05 ENCOUNTER — Ambulatory Visit (INDEPENDENT_AMBULATORY_CARE_PROVIDER_SITE_OTHER): Payer: BC Managed Care – PPO | Admitting: Family Medicine

## 2021-09-05 ENCOUNTER — Encounter: Payer: Self-pay | Admitting: Family Medicine

## 2021-09-05 DIAGNOSIS — R0602 Shortness of breath: Secondary | ICD-10-CM

## 2021-09-05 DIAGNOSIS — R0989 Other specified symptoms and signs involving the circulatory and respiratory systems: Secondary | ICD-10-CM | POA: Diagnosis not present

## 2021-09-05 DIAGNOSIS — R058 Other specified cough: Secondary | ICD-10-CM

## 2021-09-05 MED ORDER — ALBUTEROL SULFATE HFA 108 (90 BASE) MCG/ACT IN AERS
2.0000 | INHALATION_SPRAY | Freq: Four times a day (QID) | RESPIRATORY_TRACT | 2 refills | Status: DC | PRN
Start: 2021-09-05 — End: 2023-10-24

## 2021-09-05 MED ORDER — AZITHROMYCIN 250 MG PO TABS: ORAL_TABLET | ORAL | 0 refills | Status: DC

## 2021-09-05 MED ORDER — DOXYCYCLINE HYCLATE 100 MG PO TABS: 100.0000 mg | ORAL_TABLET | Freq: Two times a day (BID) | ORAL | 0 refills | Status: AC

## 2021-09-05 MED ORDER — PREDNISONE 20 MG PO TABS: 40.0000 mg | ORAL_TABLET | Freq: Every day | ORAL | 0 refills | Status: AC

## 2021-09-05 NOTE — Progress Notes (Signed)
Virtual Visit via telephone Note Due to COVID-19 pandemic this visit was conducted virtually. This visit type was conducted due to national recommendations for restrictions regarding the COVID-19 Pandemic (e.g. social distancing, sheltering in place) in an effort to limit this patient's exposure and mitigate transmission in our community. All issues noted in this document were discussed and addressed.  A physical exam was not performed with this format.   I connected with William Carson on 09/05/2021 at 1145 by telephone and verified that I am speaking with the correct person using two identifiers. William Carson is currently located at home and patient is currently with them during visit. The provider, Monia Pouch, FNP is located in their office at time of visit.  I discussed the limitations, risks, security and privacy concerns of performing an evaluation and management service by virtual visit and the availability of in person appointments. I also discussed with the patient that there may be a patient responsible charge related to this service. The patient expressed understanding and agreed to proceed.  Subjective:  Patient ID: William Carson, male    DOB: April 16, 1966, 56 y.o.   MRN: [id number]  Chief Complaint:  Cough, Wheezing, and Fever   HPI: William Carson is a 56 y.o. male presenting on 09/05/2021 for Cough, Wheezing, and Fever   Pt reports cough, congestion, rust colored sputum production, fever chills, and malaise for 4-5 days.   Cough This is a new problem. The current episode started in the past 7 days. The problem has been waxing and waning. The problem occurs every few minutes. The cough is Productive of sputum and productive of brown sputum. Associated symptoms include chills, a fever, shortness of breath (with exertion) and wheezing. Pertinent negatives include no chest pain, ear congestion, ear pain, headaches, heartburn, hemoptysis, myalgias, nasal congestion, postnasal  drip, rash, rhinorrhea, sore throat, sweats or weight loss. He has tried a beta-agonist inhaler for the symptoms. The treatment provided mild relief. His past medical history is significant for COPD and pneumonia.  Wheezing  This is a new problem. The current episode started in the past 7 days. The problem occurs intermittently. The problem has been gradually worsening. Associated symptoms include chills, coughing, a fever, shortness of breath (with exertion) and sputum production. Pertinent negatives include no abdominal pain, chest pain, coryza, diarrhea, ear pain, headaches, hemoptysis, neck pain, rash, rhinorrhea, sore throat, swollen glands or vomiting. The symptoms are aggravated by any activity. He has tried beta agonist inhalers for the symptoms. The treatment provided no relief. His past medical history is significant for CAD, COPD and pneumonia.  Fever  This is a new problem. The current episode started in the past 7 days. The problem occurs daily. The problem has been waxing and waning. The maximum temperature noted was 101 to 101.9 F. The temperature was taken using an oral thermometer. Associated symptoms include congestion, coughing and wheezing. Pertinent negatives include no abdominal pain, chest pain, diarrhea, ear pain, headaches, muscle aches, nausea, rash, sleepiness, sore throat, urinary pain or vomiting. He has tried acetaminophen for the symptoms. The treatment provided mild relief.    Relevant past medical, surgical, family, and social history reviewed and updated as indicated.  Allergies and medications reviewed and updated.   Past Medical History:  Diagnosis Date   Adenomatous polyps    Common migraine with intractable migraine 01/20/2019   COPD (chronic obstructive pulmonary disease) (HCC)    Coronary artery disease    Diabetes mellitus without complication ([place])  Diverticulosis    Hemorrhoids    Hyperlipidemia    Influenza A 10/20/2015   Obesity     Past Surgical  History:  Procedure Laterality Date   ANTERIOR CRUCIATE LIGAMENT REPAIR     S/P   CORONARY STENT PLACEMENT  2011   2 stents    Social History   Socioeconomic History   Marital status: Married    Spouse name: Not on file   Number of children: 0   Years of education: 12   Highest education level: Not on file  Occupational History   Occupation: Armed forces technical officer: OTHER    Comment: [place], [place]  Tobacco Use   Smoking status: Former    Packs/day: 1.50    Years: 30.00    Pack years: 45.00    Types: Cigarettes    Quit date: 10/16/2015    Years since quitting: 5.8   Smokeless tobacco: Former  Scientific laboratory technician Use: Never used  Substance and Sexual Activity   Alcohol use: Yes    Alcohol/week: 1.0 standard drink    Types: 1 Standard drinks or equivalent per week   Drug use: No   Sexual activity: Yes  Other Topics Concern   Not on file  Social History Narrative   Lives with wife   Right handed    Caffeine use: Coffee every Sunday   Diet-sun drop daily or coke zero   Has been married twice   Married to current wife for 3 years   No children   Social Determinants of Health   Financial Resource Strain: Not on file  Food Insecurity: Not on file  Transportation Needs: Not on file  Physical Activity: Not on file  Stress: Not on file  Social Connections: Not on file  Intimate Partner Violence: Not on file    Outpatient Encounter Medications as of 09/05/2021  Medication Sig   albuterol (VENTOLIN HFA) 108 (90 Base) MCG/ACT inhaler Inhale 2 puffs into the lungs every 6 (six) hours as needed for wheezing or shortness of breath.   azithromycin (ZITHROMAX Z-PAK) 250 MG tablet As directed   doxycycline (VIBRA-TABS) 100 MG tablet Take 1 tablet (100 mg total) by mouth 2 (two) times daily for 7 days. 1 po bid   predniSONE (DELTASONE) 20 MG tablet Take 2 tablets (40 mg total) by mouth daily with breakfast for 5 days.   albuterol (PROVENTIL) (2.5 MG/3ML) 0.083%  nebulizer solution Take 3 mLs (2.5 mg total) by nebulization every 4 (four) hours as needed for wheezing or shortness of breath.   amLODipine (NORVASC) 5 MG tablet Take 1 tablet (5 mg total) by mouth daily.   aspirin EC 81 MG tablet Take 1 tablet (81 mg total) by mouth daily. Swallow whole.   gabapentin (NEURONTIN) 100 MG capsule Take 1 capsule (100 mg total) by mouth 3 (three) times daily.   glucose blood (ONETOUCH VERIO) test strip Use to check BG up to once daily   lisinopril (ZESTRIL) 10 MG tablet Take 1 tablet (10 mg total) by mouth daily.   metFORMIN (GLUCOPHAGE-XR) 500 MG 24 hr tablet Take 1 tablet (500 mg total) by mouth daily with breakfast.   nitroGLYCERIN (NITROSTAT) 0.4 MG SL tablet Place 1 tablet (0.4 mg total) under the tongue every 5 (five) minutes as needed for chest pain. May repeat up to 3 doses.   OneTouch Delica Lancets 09F MISC Use to check BG up to once daily   Oxycodone HCl 10 MG TABS  Take 1 tablet (10 mg total) by mouth daily as needed.   Oxycodone HCl 10 MG TABS Take 1 tablet (10 mg total) by mouth daily as needed.   Oxycodone HCl 10 MG TABS Take 1 tablet (10 mg total) by mouth daily as needed.   oxyCODONE-acetaminophen (PERCOCET) 10-325 MG tablet Take 1 tablet by mouth every 4 (four) hours as needed for pain.   SYMBICORT 160-4.5 MCG/ACT inhaler INHALE 2 PUFFS INTO THE LUNGS 2 TIMES A DAY   Ubrogepant (UBRELVY) 100 MG TABS Take 100 mg by mouth 2 (two) times daily as needed.   [DISCONTINUED] albuterol (VENTOLIN HFA) 108 (90 Base) MCG/ACT inhaler Inhale 1-2 puffs into the lungs every 6 (six) hours as needed for wheezing or shortness of breath.   [DISCONTINUED] doxycycline (VIBRA-TABS) 100 MG tablet Take 1 tablet (100 mg total) by mouth 2 (two) times daily.   No facility-administered encounter medications on file as of 09/05/2021.    Allergies  Allergen Reactions   Crestor [Rosuvastatin Calcium] Other (See Comments)    myalgias   Depakote [Divalproex Sodium]     Stomach  pains   Lipitor [Atorvastatin] Other (See Comments)    Myalgias    Meclizine     Increased dizziness   Other Swelling    Mammelain meat allergy   Simvastatin Other (See Comments)    Myalgias    Statins Other (See Comments)    Myalgia    Topamax [Topiramate]     Back pain, tinnitus   Zonegran [Zonisamide]     Increased headache    Review of Systems  Constitutional:  Positive for activity change, chills, fatigue and fever. Negative for appetite change, diaphoresis, unexpected weight change and weight loss.  HENT:  Positive for congestion. Negative for ear pain, postnasal drip, rhinorrhea and sore throat.   Respiratory:  Positive for cough, sputum production, shortness of breath (with exertion) and wheezing. Negative for hemoptysis.   Cardiovascular:  Negative for chest pain.  Gastrointestinal:  Negative for abdominal pain, diarrhea, heartburn, nausea and vomiting.  Genitourinary:  Negative for decreased urine volume, difficulty urinating and dysuria.  Musculoskeletal:  Negative for arthralgias, myalgias and neck pain.  Skin:  Negative for rash.  Neurological:  Negative for dizziness, tremors, seizures, syncope, facial asymmetry, speech difficulty, weakness, light-headedness, numbness and headaches.  Psychiatric/Behavioral:  Negative for confusion.         Observations/Objective: No vital signs or physical exam, this was a virtual health encounter.  Pt alert and oriented, answers all questions appropriately, and able to speak in full sentences.    Assessment and Plan: William Carson was seen today for cough, wheezing and fever.  Diagnoses and all orders for this visit:  Cough productive of purulent sputum Chest congestion Shortness of breath Reported symptoms concerning for CAP. Due to underlying COPD, will treat for CAP with below. Pt aware to follow up in office in 2 weeks for reevaluation. Aware of red flags which require emergent evaluation and treatment. Report any new,  worsening, or persistent symptoms.  -     azithromycin (ZITHROMAX Z-PAK) 250 MG tablet; As directed -     doxycycline (VIBRA-TABS) 100 MG tablet; Take 1 tablet (100 mg total) by mouth 2 (two) times daily for 7 days. 1 po bid -     albuterol (VENTOLIN HFA) 108 (90 Base) MCG/ACT inhaler; Inhale 2 puffs into the lungs every 6 (six) hours as needed for wheezing or shortness of breath. -     predniSONE (DELTASONE) 20 MG tablet;  Take 2 tablets (40 mg total) by mouth daily with breakfast for 5 days.     Follow Up Instructions: Return in 2 weeks (on 09/19/2021), or if symptoms worsen or fail to improve.    I discussed the assessment and treatment plan with the patient. The patient was provided an opportunity to ask questions and all were answered. The patient agreed with the plan and demonstrated an understanding of the instructions.   The patient was advised to call back or seek an in-person evaluation if the symptoms worsen or if the condition fails to improve as anticipated.  The above assessment and management plan was discussed with the patient. The patient verbalized understanding of and has agreed to the management plan. Patient is aware to call the clinic if they develop any new symptoms or if symptoms persist or worsen. Patient is aware when to return to the clinic for a follow-up visit. Patient educated on when it is appropriate to go to the emergency department.    I provided 15 minutes of time during this telephone encounter.   Monia Pouch, FNP-C [facility] [address] [place], [place] [address] [phone] 09/05/2021

## 2021-09-13 ENCOUNTER — Ambulatory Visit (INDEPENDENT_AMBULATORY_CARE_PROVIDER_SITE_OTHER): Payer: BC Managed Care – PPO | Admitting: Family

## 2021-09-13 ENCOUNTER — Encounter: Payer: Self-pay | Admitting: Family

## 2021-09-13 VITALS — BP 122/70 | HR 85 | Temp 97.9°F | Ht 74.0 in | Wt 266.8 lb

## 2021-09-13 DIAGNOSIS — Z79899 Other long term (current) drug therapy: Secondary | ICD-10-CM

## 2021-09-13 DIAGNOSIS — F1129 Opioid dependence with unspecified opioid-induced disorder: Secondary | ICD-10-CM

## 2021-09-13 DIAGNOSIS — Z720 Tobacco use: Secondary | ICD-10-CM

## 2021-09-13 DIAGNOSIS — I10 Essential (primary) hypertension: Secondary | ICD-10-CM

## 2021-09-13 DIAGNOSIS — G8929 Other chronic pain: Secondary | ICD-10-CM

## 2021-09-13 DIAGNOSIS — E559 Vitamin D deficiency, unspecified: Secondary | ICD-10-CM

## 2021-09-13 DIAGNOSIS — I2510 Atherosclerotic heart disease of native coronary artery without angina pectoris: Secondary | ICD-10-CM

## 2021-09-13 DIAGNOSIS — Z1211 Encounter for screening for malignant neoplasm of colon: Secondary | ICD-10-CM

## 2021-09-13 DIAGNOSIS — M5442 Lumbago with sciatica, left side: Secondary | ICD-10-CM | POA: Diagnosis not present

## 2021-09-13 DIAGNOSIS — E1169 Type 2 diabetes mellitus with other specified complication: Secondary | ICD-10-CM

## 2021-09-13 DIAGNOSIS — E669 Obesity, unspecified: Secondary | ICD-10-CM

## 2021-09-13 DIAGNOSIS — Z9861 Coronary angioplasty status: Secondary | ICD-10-CM

## 2021-09-13 DIAGNOSIS — J438 Other emphysema: Secondary | ICD-10-CM

## 2021-09-13 DIAGNOSIS — E785 Hyperlipidemia, unspecified: Secondary | ICD-10-CM

## 2021-09-13 LAB — BAYER DCA HB A1C WAIVED: HB A1C (BAYER DCA - WAIVED): 6.3 % — ABNORMAL HIGH (ref 4.8–5.6)

## 2021-09-13 MED ORDER — OXYCODONE HCL 10 MG PO TABS: 10.0000 mg | ORAL_TABLET | Freq: Every day | ORAL | 0 refills | Status: DC | PRN

## 2021-09-13 NOTE — Progress Notes (Addendum)
Subjective:    Patient ID: William Carson, male    DOB: 1966-07-05, 56 y.o.   MRN: [id number]  Chief Complaint  Patient presents with   Medical Management of Chronic Issues   Fatigue   PT presents to the office today for chronic follow up. PT was followed by Cardiologists annually for CAD. States he has not seen them since COVID.    He has COPD and states his breathing is stable. He states he quit smoking 08/22/21. He reports his breathing is doing slightly better. He reports he taking Symbicort daily and sometimes BID when is "breathing isn't good".  Hypertension This is a chronic problem. The current episode started more than 1 year ago. The problem has been resolved since onset. The problem is controlled. Associated symptoms include malaise/fatigue. Pertinent negatives include no blurred vision, peripheral edema or shortness of breath. Risk factors for coronary artery disease include dyslipidemia, obesity, male gender and sedentary lifestyle. The current treatment provides moderate improvement.  Hyperlipidemia This is a chronic problem. The current episode started more than 1 year ago. Exacerbating diseases include obesity. Pertinent negatives include no shortness of breath. Current antihyperlipidemic treatment includes diet change. The current treatment provides mild improvement of lipids. Risk factors for coronary artery disease include dyslipidemia, diabetes mellitus, male sex, hypertension and a sedentary lifestyle.  Diabetes He presents for his follow-up diabetic visit. He has type 2 diabetes mellitus. Pertinent negatives for diabetes include no blurred vision and no foot paresthesias. Symptoms are stable. Diabetic complications include heart disease. Risk factors for coronary artery disease include dyslipidemia, diabetes mellitus, male sex, hypertension and sedentary lifestyle. (Does not check BS at home)  Back Pain This is a chronic problem. The current episode started more than 1  year ago. The problem occurs intermittently.   Pain assessment: Pain location- chronic back pain Pain on scale of 1-10- 8 Frequency- intermittent What increases pain-working, bending What makes pain Better-Rest and oxycodone Any change in general medical condition-None  Current opioids rx- Oxycodone 10 mg  # meds rx- 30 Effectiveness of current meds-good, does not take daily, only as needed Adverse reactions from pain meds-none Morphine equivalent- 12.50  Pill count performed-No Last drug screen - 09/13/21 ( high risk q32m moderate risk q675mlow risk yearly ) Urine drug screen today- Yes [place]- [place]  If yes were their any concerning findings? - none   Overdose risk: low No flowsheet data found.   Pain contract signed on: 09/13/21    Review of Systems  Constitutional:  Positive for malaise/fatigue.  Eyes:  Negative for blurred vision.  Respiratory:  Negative for shortness of breath.   Musculoskeletal:  Positive for back pain.  All other systems reviewed and are negative.     Objective:   Physical Exam Vitals reviewed.  Constitutional:      General: He is not in acute distress.    Appearance: He is well-developed. He is obese.  HENT:     Head: Normocephalic.     Right Ear: Tympanic membrane normal.     Left Ear: Tympanic membrane normal.  Eyes:     General:        Right eye: No discharge.        Left eye: No discharge.     Pupils: Pupils are equal, round, and reactive to light.  Neck:     Thyroid: No thyromegaly.  Cardiovascular:     Rate and Rhythm: Normal rate and regular rhythm.  Heart sounds: Normal heart sounds. No murmur heard. Pulmonary:     Effort: Pulmonary effort is normal. No respiratory distress.     Breath sounds: Normal breath sounds. No wheezing.     Comments: diminished Abdominal:     General: Bowel sounds are normal. There is no distension.     Palpations: Abdomen is soft.     Tenderness: There is no abdominal  tenderness.  Musculoskeletal:        General: Tenderness present. Normal range of motion.     Cervical back: Normal range of motion and neck supple.     Comments: Pain in lumbar with flexion and extension  Skin:    General: Skin is warm and dry.     Findings: No erythema or rash.  Neurological:     Mental Status: He is alert and oriented to person, place, and time.     Cranial Nerves: No cranial nerve deficit.     Deep Tendon Reflexes: Reflexes are normal and symmetric.  Psychiatric:        Behavior: Behavior normal.        Thought Content: Thought content normal.        Judgment: Judgment normal.       BP 122/70    Pulse 85    Temp 97.9 F (36.6 C) (Temporal)    Ht '6\' 2"'  (1.88 m)    Wt 266 lb 12.8 oz (121 kg)    SpO2 92%    BMI 34.26 kg/m   Assessment & Plan:  William Carson comes in today with chief complaint of Medical Management of Chronic Issues and Fatigue   Diagnosis and orders addressed:  1. Chronic left-sided low back pain with left-sided sciatica - ToxASSURE Select 13 (MW), Urine - Oxycodone HCl 10 MG TABS; Take 1 tablet (10 mg total) by mouth daily as needed.  Dispense: 30 tablet; Refill: 0 - Oxycodone HCl 10 MG TABS; Take 1 tablet (10 mg total) by mouth daily as needed.  Dispense: 30 tablet; Refill: 0 - Oxycodone HCl 10 MG TABS; Take 1 tablet (10 mg total) by mouth daily as needed.  Dispense: 30 tablet; Refill: 0 - CMP14+EGFR - CBC with Differential/Platelet  2. Controlled substance agreement signed - ToxASSURE Select 13 (MW), Urine - Oxycodone HCl 10 MG TABS; Take 1 tablet (10 mg total) by mouth daily as needed.  Dispense: 30 tablet; Refill: 0 - Oxycodone HCl 10 MG TABS; Take 1 tablet (10 mg total) by mouth daily as needed.  Dispense: 30 tablet; Refill: 0 - Oxycodone HCl 10 MG TABS; Take 1 tablet (10 mg total) by mouth daily as needed.  Dispense: 30 tablet; Refill: 0 - CMP14+EGFR - CBC with Differential/Platelet  3. Opioid dependence with opioid-induced  disorder (HCC)  - ToxASSURE Select 13 (MW), Urine - Oxycodone HCl 10 MG TABS; Take 1 tablet (10 mg total) by mouth daily as needed.  Dispense: 30 tablet; Refill: 0 - Oxycodone HCl 10 MG TABS; Take 1 tablet (10 mg total) by mouth daily as needed.  Dispense: 30 tablet; Refill: 0 - Oxycodone HCl 10 MG TABS; Take 1 tablet (10 mg total) by mouth daily as needed.  Dispense: 30 tablet; Refill: 0 - CMP14+EGFR - CBC with Differential/Platelet  4. Essential hypertension - CMP14+EGFR - CBC with Differential/Platelet  5. CAD S/P percutaneous coronary angioplasty - CMP14+EGFR - CBC with Differential/Platelet  6. Other emphysema ([place]) - CMP14+EGFR - CBC with Differential/Platelet  7. Type 2 diabetes mellitus with other specified complication, without  long-term current use of insulin ([place]) - Bayer DCA Hb A1c Waived - CMP14+EGFR - CBC with Differential/Platelet  8. Hyperlipidemia associated with type 2 diabetes mellitus (HCC) - CMP14+EGFR - CBC with Differential/Platelet  9. Tobacco abuse - CMP14+EGFR - CBC with Differential/Platelet  10. Obesity (BMI 30-39.9) - CMP14+EGFR - CBC with Differential/Platelet  11. Vitamin D deficiency - CMP14+EGFR - CBC with Differential/Platelet  12. Colon cancer screening - CMP14+EGFR - CBC with Differential/Platelet - Ambulatory referral to Gastroenterology   Labs pending Patient reviewed in [place] controlled database, no flags noted. Contract and drug screen up dated today.  Health Maintenance reviewed Diet and exercise encouraged  Follow up plan: 3 months    [name], FNP

## 2021-09-13 NOTE — Patient Instructions (Signed)
Acute Bronchitis, Adult °Acute bronchitis is sudden inflammation of the main airways (bronchi) that come off the windpipe (trachea) in the lungs. The swelling causes the airways to get smaller and make more mucus than normal. This can make it hard to breathe and can cause coughing or noisy breathing (wheezing). °Acute bronchitis may last several weeks. The cough may last longer. Allergies, asthma, and exposure to smoke may make the condition worse. °What are the causes? °This condition can be caused by germs and by substances that irritate the lungs, including: °Cold and flu viruses. The most common cause of this condition is the virus that causes the common cold. °Bacteria. This is less common. °Breathing in substances that irritate the lungs, including: °Smoke from cigarettes and other forms of tobacco. °Dust and pollen. °Fumes from household cleaning products, gases, or burned fuel. °Indoor or outdoor air pollution. °What increases the risk? °The following factors may make you more likely to develop this condition: °A weak body's defense system, also called the immune system. °A condition that affects your lungs and breathing, such as asthma. °What are the signs or symptoms? °Common symptoms of this condition include: °Coughing. This may bring up clear, yellow, or green mucus from your lungs (sputum). °Wheezing. °Runny or stuffy nose. °Having too much mucus in your lungs (chest congestion). °Shortness of breath. °Aches and pains, including sore throat or chest. °How is this diagnosed? °This condition is usually diagnosed based on: °Your symptoms and medical history. °A physical exam. °You may also have other tests, including tests to rule out other conditions, such as pneumonia. These tests include: °A test of lung function. °Test of a mucus sample to look for the presence of bacteria. °Tests to check the oxygen level in your blood. °Blood tests. °Chest X-ray. °How is this treated? °Most cases of acute bronchitis  clear up over time without treatment. Your health care provider may recommend: °Drinking more fluids to help thin your mucus so it is easier to cough up. °Taking inhaled medicine (inhaler) to improve air flow in and out of your lungs. °Using a vaporizer or a humidifier. These are machines that add water to the air to help you breathe better. °Taking a medicine that thins mucus and clears congestion (expectorant). °Taking a medicine that prevents or stops coughing (cough suppressant). °It is notcommon to take an antibiotic medicine for this condition. °Follow these instructions at home: ° °Take over-the-counter and prescription medicines only as told by your health care provider. °Use an inhaler, vaporizer, or humidifier as told by your health care provider. °Take two teaspoons (10 mL) of honey at bedtime to lessen coughing at night. °Drink enough fluid to keep your urine pale yellow. °Do not use any products that contain nicotine or tobacco. These products include cigarettes, chewing tobacco, and vaping devices, such as e-cigarettes. If you need help quitting, ask your health care provider. °Get plenty of rest. °Return to your normal activities as told by your health care provider. Ask your health care provider what activities are safe for you. °Keep all follow-up visits. This is important. °How is this prevented? °To lower your risk of getting this condition again: °Wash your hands often with soap and water for at least 20 seconds. If soap and water are not available, use hand sanitizer. °Avoid contact with people who have cold symptoms. °Try not to touch your mouth, nose, or eyes with your hands. °Avoid breathing in smoke or chemical fumes. Breathing smoke or chemical fumes will make your condition   worse. °Get the flu shot every year. °Contact a health care provider if: °Your symptoms do not improve after 2 weeks. °You have trouble coughing up the mucus. °Your cough keeps you awake at night. °You have a  fever. °Get help right away if you: °Cough up blood. °Feel pain in your chest. °Have severe shortness of breath. °Faint or keep feeling like you are going to faint. °Have a severe headache. °Have a fever or chills that get worse. °These symptoms may represent a serious problem that is an emergency. Do not wait to see if the symptoms will go away. Get medical help right away. Call your local emergency services (911 in the [place] Do not drive yourself to the hospital. °Summary °Acute bronchitis is inflammation of the main airways (bronchi) that come off the windpipe (trachea) in the lungs. The swelling causes the airways to get smaller and make more mucus than normal. °Drinking more fluids can help thin your mucus so it is easier to cough up. °Take over-the-counter and prescription medicines only as told by your health care provider. °Do not use any products that contain nicotine or tobacco. These products include cigarettes, chewing tobacco, and vaping devices, such as e-cigarettes. If you need help quitting, ask your health care provider. °Contact a health care provider if your symptoms do not improve after 2 weeks. °This information is not intended to replace advice given to you by your health care provider. Make sure you discuss any questions you have with your health care provider. °Document Revised: 11/08/2020 Document Reviewed: 11/08/2020 °Elsevier Patient Education © 2022 [employer] ° °

## 2021-09-14 LAB — CBC WITH DIFFERENTIAL/PLATELET
Basophils Absolute: 0.1 10*3/uL (ref 0.0–0.2)
Basos: 1 %
EOS (ABSOLUTE): 0.2 10*3/uL (ref 0.0–0.4)
Eos: 2 %
Hematocrit: 50.1 % (ref 37.5–51.0)
Hemoglobin: 17.2 g/dL (ref 13.0–17.7)
Immature Grans (Abs): 0.2 10*3/uL — ABNORMAL HIGH (ref 0.0–0.1)
Immature Granulocytes: 2 %
Lymphocytes Absolute: 2.8 10*3/uL (ref 0.7–3.1)
Lymphs: 22 %
MCH: 30.9 pg (ref 26.6–33.0)
MCHC: 34.3 g/dL (ref 31.5–35.7)
MCV: 90 fL (ref 79–97)
Monocytes Absolute: 1.4 10*3/uL — ABNORMAL HIGH (ref 0.1–0.9)
Monocytes: 11 %
Neutrophils Absolute: 7.6 10*3/uL — ABNORMAL HIGH (ref 1.4–7.0)
Neutrophils: 62 %
Platelets: 321 10*3/uL (ref 150–450)
RBC: 5.57 x10E6/uL (ref 4.14–5.80)
RDW: 13.2 % (ref 11.6–15.4)
WBC: 12.3 10*3/uL — ABNORMAL HIGH (ref 3.4–10.8)

## 2021-09-14 LAB — CMP14+EGFR
ALT: 43 IU/L (ref 0–44)
AST: 19 IU/L (ref 0–40)
Albumin/Globulin Ratio: 2.2 (ref 1.2–2.2)
Albumin: 4.6 g/dL (ref 3.8–4.9)
Alkaline Phosphatase: 48 IU/L (ref 44–121)
BUN/Creatinine Ratio: 14 (ref 9–20)
BUN: 10 mg/dL (ref 6–24)
Bilirubin Total: 0.4 mg/dL (ref 0.0–1.2)
CO2: 24 mmol/L (ref 20–29)
Calcium: 9.8 mg/dL (ref 8.7–10.2)
Chloride: 103 mmol/L (ref 96–106)
Creatinine, Ser: 0.73 mg/dL — ABNORMAL LOW (ref 0.76–1.27)
Globulin, Total: 2.1 g/dL (ref 1.5–4.5)
Glucose: 92 mg/dL (ref 70–99)
Potassium: 4.5 mmol/L (ref 3.5–5.2)
Sodium: 140 mmol/L (ref 134–144)
Total Protein: 6.7 g/dL (ref 6.0–8.5)
eGFR: 106 mL/min/{1.73_m2} (ref >59)

## 2021-09-18 ENCOUNTER — Encounter (INDEPENDENT_AMBULATORY_CARE_PROVIDER_SITE_OTHER): Payer: Self-pay | Admitting: *Deleted

## 2021-09-18 LAB — TOXASSURE SELECT 13 (MW), URINE

## 2021-10-23 DIAGNOSIS — H31092 Other chorioretinal scars, left eye: Secondary | ICD-10-CM | POA: Diagnosis not present

## 2021-10-23 DIAGNOSIS — H2513 Age-related nuclear cataract, bilateral: Secondary | ICD-10-CM | POA: Diagnosis not present

## 2021-10-23 DIAGNOSIS — E119 Type 2 diabetes mellitus without complications: Secondary | ICD-10-CM | POA: Diagnosis not present

## 2021-10-23 DIAGNOSIS — H43823 Vitreomacular adhesion, bilateral: Secondary | ICD-10-CM | POA: Diagnosis not present

## 2021-10-23 LAB — HM DIABETES EYE EXAM

## 2021-11-10 ENCOUNTER — Other Ambulatory Visit: Payer: Self-pay | Admitting: Family

## 2021-11-10 DIAGNOSIS — J439 Emphysema, unspecified: Secondary | ICD-10-CM

## 2021-12-03 ENCOUNTER — Encounter: Payer: Self-pay | Admitting: Family Medicine

## 2021-12-03 ENCOUNTER — Ambulatory Visit (INDEPENDENT_AMBULATORY_CARE_PROVIDER_SITE_OTHER): Payer: BC Managed Care – PPO | Admitting: Family Medicine

## 2021-12-03 VITALS — BP 108/70 | HR 83 | Temp 97.6°F | Ht 74.0 in | Wt 263.0 lb

## 2021-12-03 DIAGNOSIS — E1169 Type 2 diabetes mellitus with other specified complication: Secondary | ICD-10-CM

## 2021-12-03 DIAGNOSIS — E8889 Other specified metabolic disorders: Secondary | ICD-10-CM

## 2021-12-03 DIAGNOSIS — E1165 Type 2 diabetes mellitus with hyperglycemia: Secondary | ICD-10-CM

## 2021-12-03 LAB — URINALYSIS, ROUTINE W REFLEX MICROSCOPIC
Bilirubin, UA: NEGATIVE
Leukocytes,UA: NEGATIVE
Nitrite, UA: NEGATIVE
Protein,UA: NEGATIVE
RBC, UA: NEGATIVE
Specific Gravity, UA: >1.03 — ABNORMAL HIGH (ref 1.005–1.030)
Urobilinogen, Ur: 0.2 mg/dL (ref 0.2–1.0)
pH, UA: 5 (ref 5.0–7.5)

## 2021-12-03 LAB — MICROSCOPIC EXAMINATION
Bacteria, UA: NONE SEEN
RBC, Urine: NONE SEEN /hpf (ref 0–2)
Renal Epithel, UA: NONE SEEN /hpf

## 2021-12-03 LAB — GLUCOSE HEMOCUE WAIVED: Glu Hemocue Waived: 258 mg/dL — ABNORMAL HIGH (ref 70–99)

## 2021-12-03 MED ORDER — GLIPIZIDE 10 MG PO TABS: 10.0000 mg | ORAL_TABLET | Freq: Two times a day (BID) | ORAL | 1 refills | Status: DC

## 2021-12-03 MED ORDER — INSULIN ASPART 100 UNIT/ML IJ SOLN: 10.0000 [IU] | Freq: Once | INTRAMUSCULAR | Status: DC

## 2021-12-03 NOTE — Progress Notes (Signed)
? ?Subjective:  ?Patient ID: William Carson, male    DOB: 15-Jul-1966  Age: 56 y.o. MRN: [id number] ? ?CC: Diabetes ? ? ?HPI ?[name] presents for glucose elevated over 600 2 days ago and 350 this AM. Checked several times yesterday ranged from 250- 423. Has never been over 200 before. Took extra metformin 2 days ago.  ? ?Denies changing any activities or diet. No extra carbs except one glass of Mountain Dew 2 days ago. No recent acute illness.  ? ? ?  09/13/2021  ?  2:06 PM 06/11/2021  ? 10:55 AM 03/09/2021  ? 10:26 AM  ?Depression screen PHQ 2/9  ?Decreased Interest 0 0 0  ?Down, Depressed, Hopeless 0 0 0  ?PHQ - 2 Score 0 0 0  ? ? ?History ?Filiberto has a past medical history of Adenomatous polyps, Common migraine with intractable migraine (01/20/2019), COPD (chronic obstructive pulmonary disease) ([place]), Coronary artery disease, Diabetes mellitus without complication ([place]), Diverticulosis, Hemorrhoids, Hyperlipidemia, Influenza A (10/20/2015), and Obesity.  ? ?He has a past surgical history that includes Anterior cruciate ligament repair and Coronary stent placement (2011).  ? ?His family history includes Arrhythmia in his father; Heart attack in his maternal grandfather, mother, and paternal grandfather.He reports that he quit smoking about 6 years ago. His smoking use included cigarettes. He has a 45.00 pack-year smoking history. He has quit using smokeless tobacco. He reports current alcohol use of about 1.0 standard drink per week. He reports that he does not use drugs. ? ? ? ?ROS ?Review of Systems  ?Constitutional:  Negative for activity change and fatigue.  ?Endocrine: Positive for polydipsia and polyuria. Negative for polyphagia.  ? ?Objective:  ?BP 108/70   Pulse 83   Temp 97.6 ?F (36.4 ?C)   Ht _0  (1.88 m)   Wt 263 lb (119.3 kg)   SpO2 95%   BMI 33.77 kg/m?  ? ?BP Readings from Last 3 Encounters:  ?12/03/21 108/70  ?09/13/21 122/70  ?06/11/21 127/64  ? ? ?Wt Readings from Last 3 Encounters:   ?12/03/21 263 lb (119.3 kg)  ?09/13/21 266 lb 12.8 oz (121 kg)  ?06/11/21 260 lb 6.4 oz (118.1 kg)  ? ? ? ?Physical Exam ?Constitutional:   ?   Appearance: He is well-developed. He is ill-appearing. He is not toxic-appearing or diaphoretic.  ?HENT:  ?   Head: Normocephalic and atraumatic.  ?   Right Ear: External ear normal.  ?   Left Ear: External ear normal.  ?   Nose: Nose normal.  ?Eyes:  ?   Conjunctiva/sclera: Conjunctivae normal.  ?   Pupils: Pupils are equal, round, and reactive to light.  ?Cardiovascular:  ?   Rate and Rhythm: Normal rate and regular rhythm.  ?   Heart sounds: Normal heart sounds. No murmur heard. ?Pulmonary:  ?   Effort: Pulmonary effort is normal. No respiratory distress.  ?   Breath sounds: Normal breath sounds. No wheezing or rales.  ?Abdominal:  ?   Palpations: Abdomen is soft.  ?   Tenderness: There is no abdominal tenderness.  ?Musculoskeletal:     ?   General: Normal range of motion.  ?   Cervical back: Normal range of motion and neck supple.  ?Skin: ?   General: Skin is warm and dry.  ?Neurological:  ?   Mental Status: He is alert and oriented to person, place, and time.  ?   Deep Tendon Reflexes: Reflexes are normal and symmetric.  ?Psychiatric:     ?  Behavior: Behavior normal.     ?   Thought Content: Thought content normal.     ?   Judgment: Judgment normal.  ? ? ? ?Results for orders placed or performed in visit on 12/03/21  ?Microscopic Examination  ? Urine  ?Result Value Ref Range  ? WBC, UA 0-5 0 - 5 /hpf  ? RBC None seen 0 - 2 /hpf  ? Epithelial Cells (non renal) 0-10 0 - 10 /hpf  ? Renal Epithel, UA None seen None seen /hpf  ? Bacteria, UA None seen None seen/Few  ?Glucose Hemocue Waived  ?Result Value Ref Range  ? Glu Hemocue Waived 258 (H) 70 - 99 mg/dL  ?Urinalysis, Routine w reflex microscopic  ?Result Value Ref Range  ? Specific Gravity, UA >1.030 (H) 1.005 - 1.030  ? pH, UA 5.0 5.0 - 7.5  ? Color, UA Yellow Yellow  ? Appearance Ur Clear Clear  ? Leukocytes,UA  Negative Negative  ? Protein,UA Negative Negative/Trace  ? Glucose, UA 3+ (A) Negative  ? Ketones, UA 4+ (A) Negative  ? RBC, UA Negative Negative  ? Bilirubin, UA Negative Negative  ? Urobilinogen, Ur 0.2 0.2 - 1.0 mg/dL  ? Nitrite, UA Negative Negative  ? Microscopic Examination See below:   ? ? ?Assessment & Plan:  ? ?Katrell was seen today for diabetes. ? ?Diagnoses and all orders for this visit: ? ?Type 2 diabetes mellitus with other specified complication, without long-term current use of insulin ([place]) ?-     Glucose Hemocue Waived ?-     Urinalysis, Routine w reflex microscopic ?-     insulin aspart (novoLOG) injection 10 Units ?-     Microscopic Examination ? ?Type 2 diabetes mellitus with hyperglycemia, without long-term current use of insulin ([facility]) ?-     Urinalysis, Routine w reflex microscopic ?-     CMP14+EGFR ?-     CBC with Differential/Platelet ?-     Microscopic Examination ? ?Ketosis ([place]) ?-     Urinalysis, Routine w reflex microscopic ?-     Microscopic Examination ? ?Other orders ?-     glipiZIDE (GLUCOTROL) 10 MG tablet; Take 1 tablet (10 mg total) by mouth 2 (two) times daily before a meal. ? ? ?He should cut back on the glipizide when glucose is consistently below 200. Chec AC & hs as well as prn sx.  ? ? ? ?I am having [name] start on glipiZIDE. I am also having him maintain his albuterol, nitroGLYCERIN, aspirin EC, amLODipine, lisinopril, OneTouch Delica Lancets 62E, metFORMIN, OneTouch Verio, Ubrelvy, gabapentin, albuterol, Oxycodone HCl, Oxycodone HCl, Oxycodone HCl, and Symbicort. We will continue to administer insulin aspart. ? ?Allergies as of 12/03/2021   ? ?   Reactions  ? Crestor [rosuvastatin Calcium] Other (See Comments)  ? myalgias  ? Depakote [divalproex Sodium]   ? Stomach pains  ? Lipitor [atorvastatin] Other (See Comments)  ? Myalgias  ? Meclizine   ? Increased dizziness  ? Other Swelling  ? Mammelain meat allergy  ? Simvastatin Other (See Comments)  ? Myalgias  ?  Statins Other (See Comments)  ? Myalgia   ? Topamax [topiramate]   ? Back pain, tinnitus  ? Zonegran [zonisamide]   ? Increased headache  ? ?  ? ?  ?Medication List  ?  ? ?  ? Accurate as of Dec 03, 2021  5:42 PM. If you have any questions, ask your nurse or doctor.  ?  ?  ? ?  ? ?  albuterol (2.5 MG/3ML) 0.083% nebulizer solution ?Commonly known as: PROVENTIL ?Take 3 mLs (2.5 mg total) by nebulization every 4 (four) hours as needed for wheezing or shortness of breath. ?  ?albuterol 108 (90 Base) MCG/ACT inhaler ?Commonly known as: VENTOLIN HFA ?Inhale 2 puffs into the lungs every 6 (six) hours as needed for wheezing or shortness of breath. ?  ?amLODipine 5 MG tablet ?Commonly known as: NORVASC ?Take 1 tablet (5 mg total) by mouth daily. ?  ?aspirin EC 81 MG tablet ?Take 1 tablet (81 mg total) by mouth daily. Swallow whole. ?  ?gabapentin 100 MG capsule ?Commonly known as: Neurontin ?Take 1 capsule (100 mg total) by mouth 3 (three) times daily. ?  ?glipiZIDE 10 MG tablet ?Commonly known as: GLUCOTROL ?Take 1 tablet (10 mg total) by mouth 2 (two) times daily before a meal. ?Started by: Claretta Fraise, MD ?  ?lisinopril 10 MG tablet ?Commonly known as: ZESTRIL ?Take 1 tablet (10 mg total) by mouth daily. ?  ?metFORMIN 500 MG 24 hr tablet ?Commonly known as: GLUCOPHAGE-XR ?Take 1 tablet (500 mg total) by mouth daily with breakfast. ?  ?nitroGLYCERIN 0.4 MG SL tablet ?Commonly known as: NITROSTAT ?Place 1 tablet (0.4 mg total) under the tongue every 5 (five) minutes as needed for chest pain. May repeat up to 3 doses. ?  ?OneTouch Delica Lancets 15Q Misc ?Use to check BG up to once daily ?  ?OneTouch Verio test strip ?Generic drug: glucose blood ?Use to check BG up to once daily ?  ?Oxycodone HCl 10 MG Tabs ?Take 1 tablet (10 mg total) by mouth daily as needed. ?  ?Oxycodone HCl 10 MG Tabs ?Take 1 tablet (10 mg total) by mouth daily as needed. ?  ?Oxycodone HCl 10 MG Tabs ?Take 1 tablet (10 mg total) by mouth daily as  needed. ?  ?Symbicort 160-4.5 MCG/ACT inhaler ?Generic drug: budesonide-formoterol ?INHALE 2 PUFFS INTO THE LUNGS 2 TIMES A DAY ?  ?Ubrelvy 100 MG Tabs ?Generic drug: Ubrogepant ?Take 100 mg by mouth 2 (two) times daily a

## 2021-12-04 LAB — CBC WITH DIFFERENTIAL/PLATELET
Basophils Absolute: 0.1 10*3/uL (ref 0.0–0.2)
Basos: 1 %
EOS (ABSOLUTE): 0.1 10*3/uL (ref 0.0–0.4)
Eos: 1 %
Hematocrit: 48.7 % (ref 37.5–51.0)
Hemoglobin: 16.3 g/dL (ref 13.0–17.7)
Immature Grans (Abs): 0.1 10*3/uL (ref 0.0–0.1)
Immature Granulocytes: 1 %
Lymphocytes Absolute: 2 10*3/uL (ref 0.7–3.1)
Lymphs: 24 %
MCH: 30.9 pg (ref 26.6–33.0)
MCHC: 33.5 g/dL (ref 31.5–35.7)
MCV: 92 fL (ref 79–97)
Monocytes Absolute: 0.8 10*3/uL (ref 0.1–0.9)
Monocytes: 9 %
Neutrophils Absolute: 5.4 10*3/uL (ref 1.4–7.0)
Neutrophils: 64 %
Platelets: 290 10*3/uL (ref 150–450)
RBC: 5.27 x10E6/uL (ref 4.14–5.80)
RDW: 12.6 % (ref 11.6–15.4)
WBC: 8.4 10*3/uL (ref 3.4–10.8)

## 2021-12-04 LAB — CMP14+EGFR
ALT: 35 IU/L (ref 0–44)
AST: 20 IU/L (ref 0–40)
Albumin/Globulin Ratio: 2 (ref 1.2–2.2)
Albumin: 4.7 g/dL (ref 3.8–4.9)
Alkaline Phosphatase: 55 IU/L (ref 44–121)
BUN/Creatinine Ratio: 16 (ref 9–20)
BUN: 13 mg/dL (ref 6–24)
Bilirubin Total: 0.6 mg/dL (ref 0.0–1.2)
CO2: 19 mmol/L — ABNORMAL LOW (ref 20–29)
Calcium: 9.7 mg/dL (ref 8.7–10.2)
Chloride: 98 mmol/L (ref 96–106)
Creatinine, Ser: 0.8 mg/dL (ref 0.76–1.27)
Globulin, Total: 2.3 g/dL (ref 1.5–4.5)
Glucose: 247 mg/dL — ABNORMAL HIGH (ref 70–99)
Potassium: 5 mmol/L (ref 3.5–5.2)
Sodium: 135 mmol/L (ref 134–144)
Total Protein: 7 g/dL (ref 6.0–8.5)
eGFR: 103 mL/min/{1.73_m2} (ref >59)

## 2021-12-06 ENCOUNTER — Encounter: Payer: Self-pay | Admitting: Family Medicine

## 2021-12-06 ENCOUNTER — Telehealth: Payer: Self-pay | Admitting: Pharmacist

## 2021-12-06 ENCOUNTER — Ambulatory Visit: Payer: BC Managed Care – PPO | Admitting: Family Medicine

## 2021-12-06 VITALS — BP 118/71 | HR 54 | Temp 98.0°F | Ht 74.0 in | Wt 262.0 lb

## 2021-12-06 DIAGNOSIS — E1169 Type 2 diabetes mellitus with other specified complication: Secondary | ICD-10-CM

## 2021-12-06 DIAGNOSIS — E8889 Other specified metabolic disorders: Secondary | ICD-10-CM

## 2021-12-06 DIAGNOSIS — E1165 Type 2 diabetes mellitus with hyperglycemia: Secondary | ICD-10-CM | POA: Diagnosis not present

## 2021-12-06 LAB — HEMOGLOBIN, FINGERSTICK: Hemoglobin: 17.4 g/dL (ref 12.6–17.7)

## 2021-12-06 LAB — URINALYSIS, COMPLETE
Bilirubin, UA: NEGATIVE
Leukocytes,UA: NEGATIVE
Nitrite, UA: NEGATIVE
Protein,UA: NEGATIVE
RBC, UA: NEGATIVE
Specific Gravity, UA: 1.02 (ref 1.005–1.030)
Urobilinogen, Ur: 0.2 mg/dL (ref 0.2–1.0)
pH, UA: 5.5 (ref 5.0–7.5)

## 2021-12-06 LAB — MICROSCOPIC EXAMINATION
Epithelial Cells (non renal): NONE SEEN /hpf (ref 0–10)
RBC, Urine: NONE SEEN /hpf (ref 0–2)
Renal Epithel, UA: NONE SEEN /hpf

## 2021-12-06 MED ORDER — TRULICITY 0.75 MG/0.5ML ~~LOC~~ SOAJ: 0.7500 mg | SUBCUTANEOUS | 0 refills | Status: DC

## 2021-12-06 NOTE — Progress Notes (Signed)
BP 118/71   Pulse (!) 54   Temp 98 F (36.7 C)   Ht '6\' 2"'$  (1.88 m)   Wt 262 lb (118.8 kg)   SpO2 96%   BMI 33.64 kg/m    Subjective:   Patient ID: William Carson, male    DOB: Nov 11, 1965, 56 y.o.   MRN: [id number]  HPI: William Carson is a 56 y.o. male presenting on 12/06/2021 for Ketones in urine and Hyperglycemia (258 3 days ago)   HPI Patient is coming in for hyperglycemia/ketosis follow-up. Looks like he was on the verge of DKA when he was seen and was having hyperglycemia.  Was given glipizide and some short acting insulin and recommended hydration and he still feels very fatigued but he is starting to feel better.  Today his ketones have come down.  His blood sugar is now in the 200s instead of the 4 and 5 and 600s like it was.  Patient is still having urinary frequency but other than that he denies any abdominal pain nausea vomiting.  He is keeping fluids down.  He is feeling more energetic and more active.  Relevant past medical, surgical, family and social history reviewed and updated as indicated. Interim medical history since our last visit reviewed. Allergies and medications reviewed and updated.  Review of Systems  Constitutional:  Negative for chills and fever.  Eyes:  Negative for visual disturbance.  Respiratory:  Negative for shortness of breath and wheezing.   Cardiovascular:  Negative for chest pain and leg swelling.  Musculoskeletal:  Negative for back pain and gait problem.  Skin:  Negative for rash.  Neurological:  Negative for dizziness, weakness and light-headedness.  All other systems reviewed and are negative.  Per HPI unless specifically indicated above   Allergies as of 12/06/2021       Reactions   Crestor [rosuvastatin Calcium] Other (See Comments)   myalgias   Depakote [divalproex Sodium]    Stomach pains   Lipitor [atorvastatin] Other (See Comments)   Myalgias   Meclizine    Increased dizziness   Other Swelling   Mammelain meat  allergy   Simvastatin Other (See Comments)   Myalgias   Statins Other (See Comments)   Myalgia    Topamax [topiramate]    Back pain, tinnitus   Zonegran [zonisamide]    Increased headache        Medication List        Accurate as of Dec 06, 2021 11:19 AM. If you have any questions, ask your nurse or doctor.          STOP taking these medications    aspirin EC 81 MG tablet   gabapentin 100 MG capsule Commonly known as: Neurontin   Ubrelvy 100 MG Tabs Generic drug: Ubrogepant       TAKE these medications    albuterol (2.5 MG/3ML) 0.083% nebulizer solution Commonly known as: PROVENTIL Take 3 mLs (2.5 mg total) by nebulization every 4 (four) hours as needed for wheezing or shortness of breath.   albuterol 108 (90 Base) MCG/ACT inhaler Commonly known as: VENTOLIN HFA Inhale 2 puffs into the lungs every 6 (six) hours as needed for wheezing or shortness of breath.   amLODipine 5 MG tablet Commonly known as: NORVASC Take 1 tablet (5 mg total) by mouth daily.   glipiZIDE 10 MG tablet Commonly known as: GLUCOTROL Take 1 tablet (10 mg total) by mouth 2 (two) times daily before a meal.   lisinopril 10 MG  tablet Commonly known as: ZESTRIL Take 1 tablet (10 mg total) by mouth daily.   metFORMIN 500 MG 24 hr tablet Commonly known as: GLUCOPHAGE-XR Take 1 tablet (500 mg total) by mouth daily with breakfast.   nitroGLYCERIN 0.4 MG SL tablet Commonly known as: NITROSTAT Place 1 tablet (0.4 mg total) under the tongue every 5 (five) minutes as needed for chest pain. May repeat up to 3 doses.   OneTouch Delica Lancets 03J Misc Use to check BG up to once daily   OneTouch Verio test strip Generic drug: glucose blood Use to check BG up to once daily   Oxycodone HCl 10 MG Tabs Take 1 tablet (10 mg total) by mouth daily as needed.   Oxycodone HCl 10 MG Tabs Take 1 tablet (10 mg total) by mouth daily as needed.   Oxycodone HCl 10 MG Tabs Take 1 tablet (10 mg  total) by mouth daily as needed.   Symbicort 160-4.5 MCG/ACT inhaler Generic drug: budesonide-formoterol INHALE 2 PUFFS INTO THE LUNGS 2 TIMES A DAY   Trulicity 0.09 FG/1.8EX Sopn Generic drug: Dulaglutide Inject 0.75 mg into the skin once a week.         Objective:   BP 118/71   Pulse (!) 54   Temp 98 F (36.7 C)   Ht '6\' 2"'$  (1.88 m)   Wt 262 lb (118.8 kg)   SpO2 96%   BMI 33.64 kg/m   Wt Readings from Last 3 Encounters:  12/06/21 262 lb (118.8 kg)  12/03/21 263 lb (119.3 kg)  09/13/21 266 lb 12.8 oz (121 kg)    Physical Exam Vitals and nursing note reviewed.  Constitutional:      General: He is not in acute distress.    Appearance: He is well-developed. He is not diaphoretic.  Eyes:     General: No scleral icterus.    Conjunctiva/sclera: Conjunctivae normal.  Neck:     Thyroid: No thyromegaly.  Cardiovascular:     Rate and Rhythm: Normal rate and regular rhythm.     Heart sounds: Normal heart sounds. No murmur heard. Pulmonary:     Effort: Pulmonary effort is normal. No respiratory distress.     Breath sounds: Normal breath sounds. No wheezing.  Abdominal:     General: Abdomen is flat. Bowel sounds are normal. There is no distension.     Tenderness: There is no abdominal tenderness. There is no guarding or rebound.  Musculoskeletal:        General: Normal range of motion.     Cervical back: Neck supple.  Lymphadenopathy:     Cervical: No cervical adenopathy.  Skin:    General: Skin is warm and dry.     Findings: No rash.  Neurological:     Mental Status: He is alert and oriented to person, place, and time.     Coordination: Coordination normal.  Psychiatric:        Behavior: Behavior normal.    Urinalysis today shows 2+ glucose and 1+ bilirubin and 1+ ketones, this is improvement from the 4+ ketones that he has a couple days ago.  Assessment & Plan:   Problem List Items Addressed This Visit       Endocrine   T2DM (type 2 diabetes mellitus)  ([place]) - Primary   Relevant Medications   Dulaglutide (TRULICITY) 9.37 JI/9.6VE SOPN   Other Relevant Orders   Hemoglobin, fingerstick   Urinalysis, Complete   Other Visit Diagnoses     Ketosis ([place])  Relevant Orders   Hemoglobin, fingerstick   Urinalysis, Complete       Continue the glipizide and the metformin but will add Trulicity   Patient's blood sugars running in the 200s, with the glipizide and metformin, will start him on Trulicity.  He cannot pinpoint a specific thing that brought it up like this.  He denies any signs of infection or urinary issues or lung infections. Will start patient on Trulicity and follow-up with his PCP next week.  Follow up plan: Return if symptoms worsen or fail to improve.  Counseling provided for all of the vaccine components Orders Placed This Encounter  Procedures   Hemoglobin, fingerstick   Urinalysis, Complete    Caryl Pina, MD [facility] 12/06/2021, 11:19 AM

## 2021-12-14 ENCOUNTER — Ambulatory Visit: Payer: BC Managed Care – PPO | Admitting: Family

## 2021-12-14 ENCOUNTER — Encounter: Payer: Self-pay | Admitting: Family

## 2021-12-14 VITALS — BP 103/60 | HR 78 | Temp 98.0°F | Ht 74.0 in | Wt 266.2 lb

## 2021-12-14 DIAGNOSIS — J438 Other emphysema: Secondary | ICD-10-CM

## 2021-12-14 DIAGNOSIS — Z79899 Other long term (current) drug therapy: Secondary | ICD-10-CM

## 2021-12-14 DIAGNOSIS — E1169 Type 2 diabetes mellitus with other specified complication: Secondary | ICD-10-CM

## 2021-12-14 DIAGNOSIS — E785 Hyperlipidemia, unspecified: Secondary | ICD-10-CM

## 2021-12-14 DIAGNOSIS — E559 Vitamin D deficiency, unspecified: Secondary | ICD-10-CM

## 2021-12-14 DIAGNOSIS — I2510 Atherosclerotic heart disease of native coronary artery without angina pectoris: Secondary | ICD-10-CM

## 2021-12-14 DIAGNOSIS — M5442 Lumbago with sciatica, left side: Secondary | ICD-10-CM

## 2021-12-14 DIAGNOSIS — F1129 Opioid dependence with unspecified opioid-induced disorder: Secondary | ICD-10-CM

## 2021-12-14 DIAGNOSIS — E669 Obesity, unspecified: Secondary | ICD-10-CM

## 2021-12-14 DIAGNOSIS — Z9861 Coronary angioplasty status: Secondary | ICD-10-CM | POA: Diagnosis not present

## 2021-12-14 DIAGNOSIS — I10 Essential (primary) hypertension: Secondary | ICD-10-CM | POA: Diagnosis not present

## 2021-12-14 DIAGNOSIS — G8929 Other chronic pain: Secondary | ICD-10-CM

## 2021-12-14 LAB — BAYER DCA HB A1C WAIVED: HB A1C (BAYER DCA - WAIVED): 9.1 % — ABNORMAL HIGH (ref 4.8–5.6)

## 2021-12-14 MED ORDER — OXYCODONE HCL 10 MG PO TABS: 10.0000 mg | ORAL_TABLET | Freq: Every day | ORAL | 0 refills | Status: DC | PRN

## 2021-12-14 NOTE — Progress Notes (Signed)
Subjective:    Patient ID: William Carson, male    DOB: 02/03/1966, 56 y.o.   MRN: [id number]  Chief Complaint  Patient presents with   Medical Management of Chronic Issues   PT presents to the office today for chronic follow up. PT was followed by Cardiologists annually for CAD. States he has not seen them since COVID.    He has COPD and states his breathing is stable. He states he quit smoking 08/22/21. He reports his breathing is doing slightly better. He reports he taking Symbicort daily and sometimes BID when is "breathing isn't good".  Hypertension This is a chronic problem. The current episode started more than 1 year ago. The problem has been resolved since onset. The problem is controlled. Associated symptoms include blurred vision. Pertinent negatives include no malaise/fatigue, peripheral edema or shortness of breath. Risk factors for coronary artery disease include dyslipidemia, obesity, male gender and sedentary lifestyle. The current treatment provides moderate improvement. There is no history of CVA or heart failure.  Diabetes He presents for his follow-up diabetic visit. He has type 2 diabetes mellitus. Associated symptoms include blurred vision. Pertinent negatives for diabetes include no foot paresthesias and no weakness. Symptoms are stable. Pertinent negatives for diabetic complications include no CVA. Risk factors for coronary artery disease include diabetes mellitus, dyslipidemia, male sex, hypertension and sedentary lifestyle. He is following a generally unhealthy diet. His overall blood glucose range is >200 mg/dl.  Hyperlipidemia This is a chronic problem. The current episode started more than 1 year ago. The problem is uncontrolled. Exacerbating diseases include obesity. Pertinent negatives include no shortness of breath. Current antihyperlipidemic treatment includes statins. The current treatment provides moderate improvement of lipids. Risk factors for coronary artery  disease include dyslipidemia, diabetes mellitus, hypertension, male sex and a sedentary lifestyle.  Back Pain This is a chronic problem. The current episode started more than 1 year ago. The problem occurs intermittently. The problem has been waxing and waning since onset. The pain is present in the lumbar spine. The quality of the pain is described as aching. The pain is at a severity of 10/10. The pain is moderate. The symptoms are aggravated by twisting and bending. Pertinent negatives include no perianal numbness, tingling or weakness. Risk factors include obesity. He has tried analgesics for the symptoms. The treatment provided mild relief.      Current opioids rx- Oxycodone 10 mg  # meds rx- 30 Effectiveness of current meds-stable  Adverse reactions from pain meds-stable Morphine equivalent- 15  Pill count performed-No Last drug screen - 09/13/21 ( high risk q73m moderate risk q696mlow risk yearly ) Urine drug screen today- yes [place]  If yes were their any concerning findings? - none  Pain contract signed on: 09/13/21  Review of Systems  Constitutional:  Negative for malaise/fatigue.  Eyes:  Positive for blurred vision.  Respiratory:  Negative for shortness of breath.   Musculoskeletal:  Positive for back pain.  Neurological:  Negative for tingling and weakness.  All other systems reviewed and are negative.     Objective:   Physical Exam Vitals reviewed.  Constitutional:      General: He is not in acute distress.    Appearance: He is well-developed. He is obese.  HENT:     Head: Normocephalic.     Right Ear: External ear normal.     Left Ear: External ear normal.  Eyes:     General:  Right eye: No discharge.        Left eye: No discharge.     Pupils: Pupils are equal, round, and reactive to light.  Neck:     Thyroid: No thyromegaly.  Cardiovascular:     Rate and Rhythm: Normal rate and regular rhythm.     Heart sounds: Normal heart  sounds. No murmur heard. Pulmonary:     Effort: Pulmonary effort is normal. No respiratory distress.     Breath sounds: Normal breath sounds. No wheezing.  Abdominal:     General: Bowel sounds are normal. There is no distension.     Palpations: Abdomen is soft.     Tenderness: There is no abdominal tenderness.  Musculoskeletal:        General: No tenderness.     Cervical back: Normal range of motion and neck supple.     Comments: Lumbar pain pain with flexion and extension   Skin:    General: Skin is warm and dry.     Findings: No erythema or rash.  Neurological:     Mental Status: He is alert and oriented to person, place, and time.     Cranial Nerves: No cranial nerve deficit.     Deep Tendon Reflexes: Reflexes are normal and symmetric.  Psychiatric:        Behavior: Behavior normal.        Thought Content: Thought content normal.        Judgment: Judgment normal.      BP 103/60   Pulse 78   Temp 98 F (36.7 C)   Ht '6\' 2"'  (1.88 m)   Wt 266 lb 3.2 oz (120.7 kg)   SpO2 96%   BMI 34.18 kg/m      Assessment & Plan:  JURIS GOSNELL comes in today with chief complaint of Medical Management of Chronic Issues   Diagnosis and orders addressed:  1. Essential hypertension - CMP14+EGFR  2. CAD S/P percutaneous coronary angioplasty - CMP14+EGFR  3. Other emphysema ([place]) - CMP14+EGFR  4. Type 2 diabetes mellitus with other specified complication, without long-term current use of insulin (HCC) - Bayer DCA Hb A1c Waived - CMP14+EGFR  5. Hyperlipidemia associated with type 2 diabetes mellitus (HCC) - CMP14+EGFR  6. Chronic left-sided low back pain with left-sided sciatica - Oxycodone HCl 10 MG TABS; Take 1 tablet (10 mg total) by mouth daily as needed.  Dispense: 30 tablet; Refill: 0 - Oxycodone HCl 10 MG TABS; Take 1 tablet (10 mg total) by mouth daily as needed.  Dispense: 30 tablet; Refill: 0 - Oxycodone HCl 10 MG TABS; Take 1 tablet (10 mg total) by mouth daily as  needed.  Dispense: 30 tablet; Refill: 0 - CMP14+EGFR - ToxASSURE Select 13 (MW), Urine  7. Vitamin D deficiency - CMP14+EGFR  8. Obesity (BMI 30-39.9) - CMP14+EGFR  9. Controlled substance agreement signed - Oxycodone HCl 10 MG TABS; Take 1 tablet (10 mg total) by mouth daily as needed.  Dispense: 30 tablet; Refill: 0 - Oxycodone HCl 10 MG TABS; Take 1 tablet (10 mg total) by mouth daily as needed.  Dispense: 30 tablet; Refill: 0 - Oxycodone HCl 10 MG TABS; Take 1 tablet (10 mg total) by mouth daily as needed.  Dispense: 30 tablet; Refill: 0 - CMP14+EGFR - ToxASSURE Select 13 (MW), Urine  10. Opioid dependence with opioid-induced disorder (HCC) - Oxycodone HCl 10 MG TABS; Take 1 tablet (10 mg total) by mouth daily as needed.  Dispense: 30 tablet; Refill:  0 - Oxycodone HCl 10 MG TABS; Take 1 tablet (10 mg total) by mouth daily as needed.  Dispense: 30 tablet; Refill: 0 - Oxycodone HCl 10 MG TABS; Take 1 tablet (10 mg total) by mouth daily as needed.  Dispense: 30 tablet; Refill: 0 - CMP14+EGFR - ToxASSURE Select 13 (MW), Urine   Labs pending Patient reviewed in [place] controlled database, no flags noted. Contract and drug screen are up to date.  Health Maintenance reviewed Diet and exercise encouraged  Follow up plan: 3 months   [name], FNP

## 2021-12-14 NOTE — Patient Instructions (Signed)

## 2021-12-14 NOTE — Addendum Note (Signed)
Addended by: Baldomero Lamy B on: 12/14/2021 02:21 PM   Modules accepted: Orders

## 2021-12-15 LAB — CMP14+EGFR
ALT: 68 IU/L — ABNORMAL HIGH (ref 0–44)
AST: 37 IU/L (ref 0–40)
Albumin/Globulin Ratio: 2.2 (ref 1.2–2.2)
Albumin: 4.8 g/dL (ref 3.8–4.9)
Alkaline Phosphatase: 46 IU/L (ref 44–121)
BUN/Creatinine Ratio: 13 (ref 9–20)
BUN: 12 mg/dL (ref 6–24)
Bilirubin Total: 0.6 mg/dL (ref 0.0–1.2)
CO2: 22 mmol/L (ref 20–29)
Calcium: 10.1 mg/dL (ref 8.7–10.2)
Chloride: 97 mmol/L (ref 96–106)
Creatinine, Ser: 0.93 mg/dL (ref 0.76–1.27)
Globulin, Total: 2.2 g/dL (ref 1.5–4.5)
Glucose: 104 mg/dL — ABNORMAL HIGH (ref 70–99)
Potassium: 4.8 mmol/L (ref 3.5–5.2)
Sodium: 136 mmol/L (ref 134–144)
Total Protein: 7 g/dL (ref 6.0–8.5)
eGFR: 96 mL/min/{1.73_m2} (ref >59)

## 2021-12-18 ENCOUNTER — Telehealth: Payer: Self-pay | Admitting: Family

## 2021-12-18 ENCOUNTER — Other Ambulatory Visit: Payer: Self-pay | Admitting: Family

## 2021-12-18 MED ORDER — TRESIBA FLEXTOUCH 100 UNIT/ML ~~LOC~~ SOPN: 10.0000 [IU] | PEN_INJECTOR | Freq: Every day | SUBCUTANEOUS | 1 refills | Status: DC

## 2021-12-18 MED ORDER — TRULICITY 1.5 MG/0.5ML ~~LOC~~ SOAJ: 1.5000 mg | SUBCUTANEOUS | 1 refills | Status: DC

## 2021-12-18 NOTE — Telephone Encounter (Signed)
Pt says that since he has been on Dulaglutide (TRULICITY) 3.29 JJ/8.8CZ SOPN his vision is blurry. Please call back

## 2021-12-18 NOTE — Telephone Encounter (Signed)
See results from earlier today, William Carson was sent in for pt to start and also pt scheduled for a 1 month follow up.

## 2021-12-18 NOTE — Addendum Note (Signed)
Addended by: [name] A on: 12/18/2021 01:47 PM   Modules accepted: Orders

## 2021-12-19 ENCOUNTER — Telehealth: Payer: Self-pay | Admitting: Family

## 2021-12-19 NOTE — Telephone Encounter (Signed)
Patient aware how to take his medication.

## 2021-12-20 LAB — TOXASSURE SELECT 13 (MW), URINE

## 2021-12-24 ENCOUNTER — Other Ambulatory Visit: Payer: Self-pay | Admitting: Family

## 2021-12-24 DIAGNOSIS — I10 Essential (primary) hypertension: Secondary | ICD-10-CM

## 2022-01-09 ENCOUNTER — Ambulatory Visit: Payer: BC Managed Care – PPO | Admitting: Cardiology

## 2022-01-09 ENCOUNTER — Telehealth: Payer: Self-pay | Admitting: Family

## 2022-01-09 ENCOUNTER — Encounter: Payer: Self-pay | Admitting: Cardiology

## 2022-01-09 ENCOUNTER — Other Ambulatory Visit: Payer: Self-pay | Admitting: Family

## 2022-01-09 VITALS — BP 110/65 | HR 73 | Ht 74.0 in | Wt 258.4 lb

## 2022-01-09 DIAGNOSIS — Z720 Tobacco use: Secondary | ICD-10-CM

## 2022-01-09 DIAGNOSIS — I2510 Atherosclerotic heart disease of native coronary artery without angina pectoris: Secondary | ICD-10-CM | POA: Diagnosis not present

## 2022-01-09 DIAGNOSIS — I10 Essential (primary) hypertension: Secondary | ICD-10-CM

## 2022-01-09 DIAGNOSIS — E7800 Pure hypercholesterolemia, unspecified: Secondary | ICD-10-CM

## 2022-01-09 DIAGNOSIS — Z9861 Coronary angioplasty status: Secondary | ICD-10-CM

## 2022-01-09 MED ORDER — ASPIRIN 81 MG PO TBEC: 81.0000 mg | DELAYED_RELEASE_TABLET | Freq: Every day | ORAL | 3 refills | Status: AC

## 2022-01-09 MED ORDER — EZETIMIBE 10 MG PO TABS: 10.0000 mg | ORAL_TABLET | Freq: Every day | ORAL | 3 refills | Status: DC

## 2022-01-09 NOTE — Telephone Encounter (Signed)
  Prescription Request  01/09/2022   What is the name of the medication or equipment? GLUCOSE TEST STRIPS  Have you contacted your pharmacy to request a refill? YES  Which pharmacy would you like this sent to? [facility]  Pt says he was told by [name] to check his BS 5x's daily, which is what pt has been doing. He is out of test strips and needs more sent to pharmacy.

## 2022-01-09 NOTE — Telephone Encounter (Signed)
Pt aware refills for test strips & lancets sent to [facility] in Rx request in basket

## 2022-01-09 NOTE — Progress Notes (Signed)
HPI: FU coronary artery disease. Admitted in November of 2011 and ruled in for a subendocardial myocardial infarctions. Cardiac catheterization performed on June 19, 2010 revealed normal LM; 30% proximal LAD; normal Lcx; 90% RCA; normal EF; patient had 2 drug-eluting stents to the right coronary artery at that time. Patient is intolerant to most statins but now taking livalo. Exercise treadmill June 2018 normal.  Carotid Dopplers October 2018 showed less than 50% right and left internal carotid artery stenosis. Since I last saw him, he denies dyspnea, chest pain, palpitations or syncope.  Current Outpatient Medications  Medication Sig Dispense Refill   albuterol (VENTOLIN HFA) 108 (90 Base) MCG/ACT inhaler Inhale 2 puffs into the lungs every 6 (six) hours as needed for wheezing or shortness of breath. 8 g 2   amLODipine (NORVASC) 5 MG tablet TAKE ONE TABLET ONCE DAILY 90 tablet 0   glucose blood (ONETOUCH VERIO) test strip Use to check BG up to once daily 100 each 2   insulin degludec (TRESIBA FLEXTOUCH) 100 UNIT/ML FlexTouch Pen Inject 10 Units into the skin daily. 9 mL 1   lisinopril (ZESTRIL) 10 MG tablet Take 1 tablet (10 mg total) by mouth daily. 90 tablet 3   metFORMIN (GLUCOPHAGE-XR) 500 MG 24 hr tablet Take 1 tablet (500 mg total) by mouth daily with breakfast. 90 tablet 2   nitroGLYCERIN (NITROSTAT) 0.4 MG SL tablet Place 1 tablet (0.4 mg total) under the tongue every 5 (five) minutes as needed for chest pain. May repeat up to 3 doses. 25 tablet 3   OneTouch Delica Lancets 73X MISC Use to check BG up to once daily 100 each 2   Oxycodone HCl 10 MG TABS Take 1 tablet (10 mg total) by mouth daily as needed. 30 tablet 0   SYMBICORT 160-4.5 MCG/ACT inhaler INHALE 2 PUFFS INTO THE LUNGS 2 TIMES A DAY 10.2 g 1   albuterol (PROVENTIL) (2.5 MG/3ML) 0.083% nebulizer solution Take 3 mLs (2.5 mg total) by nebulization every 4 (four) hours as needed for wheezing or shortness of breath.  (Patient not taking: Reported on 01/09/2022) 150 mL 1   glipiZIDE (GLUCOTROL) 10 MG tablet Take 1 tablet (10 mg total) by mouth 2 (two) times daily before a meal. (Patient not taking: Reported on 01/09/2022) 14 tablet 1   Current Facility-Administered Medications  Medication Dose Route Frequency Provider Last Rate Last Admin   insulin aspart (novoLOG) injection 10 Units  10 Units Subcutaneous Once Claretta Fraise, MD         Past Medical History:  Diagnosis Date   Adenomatous polyps    Common migraine with intractable migraine 01/20/2019   COPD (chronic obstructive pulmonary disease) ([place])    Coronary artery disease    Diabetes mellitus without complication ([place])    Diverticulosis    Hemorrhoids    Hyperlipidemia    Influenza A 10/20/2015   Obesity     Past Surgical History:  Procedure Laterality Date   ANTERIOR CRUCIATE LIGAMENT REPAIR     S/P   CORONARY STENT PLACEMENT  2011   2 stents    Social History   Socioeconomic History   Marital status: Married    Spouse name: Not on file   Number of children: 0   Years of education: 12   Highest education level: Not on file  Occupational History   Occupation: Armed forces technical officer: OTHER    Comment: [place], [place]  Tobacco Use   Smoking status:  Former    Packs/day: 1.50    Years: 30.00    Total pack years: 45.00    Types: Cigarettes    Quit date: 10/16/2015    Years since quitting: 6.2   Smokeless tobacco: Former  Scientific laboratory technician Use: Never used  Substance and Sexual Activity   Alcohol use: Yes    Alcohol/week: 1.0 standard drink of alcohol    Types: 1 Standard drinks or equivalent per week   Drug use: No   Sexual activity: Yes  Other Topics Concern   Not on file  Social History Narrative   Lives with wife   Right handed    Caffeine use: Coffee every Sunday   Diet-sun drop daily or coke zero   Has been married twice   Married to current wife for 3 years   No children   Social Determinants of  Radio broadcast assistant Strain: Not on file  Food Insecurity: Not on file  Transportation Needs: Not on file  Physical Activity: Not on file  Stress: Not on file  Social Connections: Not on file  Intimate Partner Violence: Not on file    Family History  Problem Relation Age of Onset   Heart attack Mother        Infarction   Arrhythmia Father        Atrial fibrillation   Heart attack Maternal Grandfather    Heart attack Paternal Grandfather     ROS: no fevers or chills, productive cough, hemoptysis, dysphasia, odynophagia, melena, hematochezia, dysuria, hematuria, rash, seizure activity, orthopnea, PND, pedal edema, claudication. Remaining systems are negative.  Physical Exam: Well-developed well-nourished in no acute distress.  Skin is warm and dry.  HEENT is normal.  Neck is supple.  Chest is clear to auscultation with normal expansion.  Cardiovascular exam is regular rate and rhythm.  Abdominal exam nontender or distended. No masses palpated. Extremities show no edema. neuro grossly intact  ECG-sinus rhythm at a rate of 73, nonspecific ST changes.  Personally reviewed  A/P  1 coronary artery disease-patient doing well with no chest pain.  Resume aspirin 81 mg daily.  Note he did have a perinephric hematoma in the past but I would like to see if he can tolerate antiplatelet medications.  Intolerant to statins.  2 hypertension-blood pressure controlled.  Continue present medical regimen.  3 hyperlipidemia-he is intolerant to statins.  I will try Zetia 10 mg daily.  Check lipids and liver in 8 weeks.  If not at goal we will refer for consideration of inclisiran, Repatha or Praluent.  4 history of obesity-needs weight loss.  5 tobacco abuse-patient counseled on discontinuing.  Kirk Ruths, MD

## 2022-01-09 NOTE — Patient Instructions (Signed)
Medication Instructions:   START ASPIRIN 81 MG ONCE DAILY  START EZETIMIBE 10 MG ONCE DAILY  *If you need a refill on your cardiac medications before your next appointment, please call your pharmacy*   Lab Work:  Your physician recommends that you return for lab work in: [place]  If you have labs (blood work) drawn today and your tests are completely normal, you will receive your results only by: [place] (if you have MyChart) OR A paper copy in the mail If you have any lab test that is abnormal or we need to change your treatment, we will call you to review the results.   Follow-Up: At [facility], you and your health needs are our priority.  As part of our continuing mission to provide you with exceptional heart care, we have created designated Provider Care Teams.  These Care Teams include your primary Cardiologist (physician) and Advanced Practice Providers (APPs -  Physician Assistants and Nurse Practitioners) who all work together to provide you with the care you need, when you need it.  We recommend signing up for the patient portal called "MyChart".  Sign up information is provided on this After Visit Summary.  MyChart is used to connect with patients for Virtual Visits (Telemedicine).  Patients are able to view lab/test results, encounter notes, upcoming appointments, etc.  Non-urgent messages can be sent to your provider as well.   To learn more about what you can do with MyChart, go to [email or web address].    Your next appointment:   12 month(s)  The format for your next appointment:   In Person  Provider:   Kirk Ruths, MD       Important Information About Sugar

## 2022-01-14 DIAGNOSIS — E113293 Type 2 diabetes mellitus with mild nonproliferative diabetic retinopathy without macular edema, bilateral: Secondary | ICD-10-CM | POA: Diagnosis not present

## 2022-01-14 DIAGNOSIS — H43823 Vitreomacular adhesion, bilateral: Secondary | ICD-10-CM | POA: Diagnosis not present

## 2022-01-14 DIAGNOSIS — H2513 Age-related nuclear cataract, bilateral: Secondary | ICD-10-CM | POA: Diagnosis not present

## 2022-01-14 DIAGNOSIS — H31092 Other chorioretinal scars, left eye: Secondary | ICD-10-CM | POA: Diagnosis not present

## 2022-01-14 LAB — HM DIABETES EYE EXAM

## 2022-01-17 ENCOUNTER — Ambulatory Visit: Payer: BC Managed Care – PPO | Admitting: Family

## 2022-01-17 ENCOUNTER — Encounter: Payer: Self-pay | Admitting: Family

## 2022-01-17 VITALS — BP 130/78 | HR 82 | Temp 97.7°F | Ht 74.0 in | Wt 258.4 lb

## 2022-01-17 DIAGNOSIS — F1129 Opioid dependence with unspecified opioid-induced disorder: Secondary | ICD-10-CM

## 2022-01-17 DIAGNOSIS — E1169 Type 2 diabetes mellitus with other specified complication: Secondary | ICD-10-CM

## 2022-01-17 DIAGNOSIS — E785 Hyperlipidemia, unspecified: Secondary | ICD-10-CM

## 2022-01-17 DIAGNOSIS — E119 Type 2 diabetes mellitus without complications: Secondary | ICD-10-CM

## 2022-01-17 DIAGNOSIS — M5442 Lumbago with sciatica, left side: Secondary | ICD-10-CM | POA: Diagnosis not present

## 2022-01-17 DIAGNOSIS — I10 Essential (primary) hypertension: Secondary | ICD-10-CM

## 2022-01-17 DIAGNOSIS — G8929 Other chronic pain: Secondary | ICD-10-CM | POA: Diagnosis not present

## 2022-01-17 DIAGNOSIS — E669 Obesity, unspecified: Secondary | ICD-10-CM

## 2022-01-17 DIAGNOSIS — Z79899 Other long term (current) drug therapy: Secondary | ICD-10-CM | POA: Diagnosis not present

## 2022-01-17 MED ORDER — LISINOPRIL 10 MG PO TABS: 10.0000 mg | ORAL_TABLET | Freq: Every day | ORAL | 3 refills | Status: DC

## 2022-01-17 MED ORDER — METFORMIN HCL ER 500 MG PO TB24: 500.0000 mg | ORAL_TABLET | Freq: Every day | ORAL | 2 refills | Status: DC

## 2022-01-17 NOTE — Patient Instructions (Signed)

## 2022-01-17 NOTE — Progress Notes (Signed)
 Subjective:    Patient ID: William Carson, male    DOB: 05/17/1966, 56 y.o.   MRN: [id number]  Chief Complaint  Patient presents with   Follow-up   Hypertension   Pt presents to the office today to follow up on uncontrolled DM. He was seen last month and his A1C was 9.1. We started him on Tresiba 10 units nightly. States his glucose has decreased to 130-140 in AM.  Diabetes He presents for his follow-up diabetic visit. He has type 2 diabetes mellitus. There are no hypoglycemic associated symptoms. Associated symptoms include blurred vision. Pertinent negatives for diabetes include no foot paresthesias. Symptoms are improving. Diabetic complications include heart disease. Pertinent negatives for diabetic complications include no CVA. Risk factors for coronary artery disease include dyslipidemia, diabetes mellitus, hypertension, male sex and sedentary lifestyle. He is following a generally unhealthy diet. His overall blood glucose range is 130-140 mg/dl. Eye exam is current.  Hypertension This is a chronic problem. The current episode started more than 1 year ago. The problem has been resolved since onset. The problem is controlled. Associated symptoms include blurred vision. Pertinent negatives include no malaise/fatigue, peripheral edema or shortness of breath. Risk factors for coronary artery disease include dyslipidemia, diabetes mellitus, obesity and male gender. The current treatment provides moderate improvement. There is no history of CVA or heart failure.  Hyperlipidemia This is a chronic problem. The current episode started more than 1 year ago. The problem is uncontrolled. Exacerbating diseases include obesity. Pertinent negatives include no shortness of breath. Current antihyperlipidemic treatment includes ezetimibe. The current treatment provides mild improvement of lipids.      Review of Systems  Constitutional:  Negative for malaise/fatigue.  Eyes:  Positive for blurred vision.   Respiratory:  Negative for shortness of breath.   All other systems reviewed and are negative.      Objective:   Physical Exam Vitals reviewed.  Constitutional:      General: He is not in acute distress.    Appearance: He is well-developed. He is obese.  HENT:     Head: Normocephalic.     Right Ear: Tympanic membrane normal.     Left Ear: Tympanic membrane normal.  Eyes:     General:        Right eye: No discharge.        Left eye: No discharge.     Pupils: Pupils are equal, round, and reactive to light.  Neck:     Thyroid: No thyromegaly.  Cardiovascular:     Rate and Rhythm: Normal rate and regular rhythm.     Heart sounds: Normal heart sounds. No murmur heard. Pulmonary:     Effort: Pulmonary effort is normal. No respiratory distress.     Breath sounds: Normal breath sounds. No wheezing.  Abdominal:     General: Bowel sounds are normal. There is no distension.     Palpations: Abdomen is soft.     Tenderness: There is no abdominal tenderness.  Musculoskeletal:        General: No tenderness. Normal range of motion.     Cervical back: Normal range of motion and neck supple.  Skin:    General: Skin is warm and dry.     Findings: No erythema or rash.  Neurological:     Mental Status: He is alert and oriented to person, place, and time.     Cranial Nerves: No cranial nerve deficit.     Deep Tendon Reflexes: Reflexes are normal and   symmetric.  Psychiatric:        Behavior: Behavior normal.        Thought Content: Thought content normal.        Judgment: Judgment normal.       BP 130/78   Pulse 82   Temp 97.7 F (36.5 C)   Ht 6' 2" (1.88 m)   Wt 258 lb 6.4 oz (117.2 kg)   SpO2 95%   BMI 33.18 kg/m      Assessment & Plan:  AMARIE TARTE comes in today with chief complaint of Follow-up and Diabetes   Diagnosis and orders addressed:  1. Chronic left-sided low back pain with left-sided sciatica  - CMP14+EGFR  2. Controlled substance agreement  signed - CMP14+EGFR  3. Opioid dependence with opioid-induced disorder (HCC) - CMP14+EGFR  4. Essential hypertension - lisinopril (ZESTRIL) 10 MG tablet; Take 1 tablet (10 mg total) by mouth daily.  Dispense: 90 tablet; Refill: 3 - CMP14+EGFR  5. Type 2 diabetes mellitus without complication, without long-term current use of insulin (HCC) - lisinopril (ZESTRIL) 10 MG tablet; Take 1 tablet (10 mg total) by mouth daily.  Dispense: 90 tablet; Refill: 3 - metFORMIN (GLUCOPHAGE-XR) 500 MG 24 hr tablet; Take 1 tablet (500 mg total) by mouth daily with breakfast.  Dispense: 90 tablet; Refill: 2 - CMP14+EGFR  6. Obesity (BMI 30-39.9) - CMP14+EGFR  7. Hyperlipidemia associated with type 2 diabetes mellitus ([place]) - CMP14+EGFR   Labs pending Health Maintenance reviewed Diet and exercise encouraged  Follow up plan: 2 months for chronic follow up  [name], FNP

## 2022-01-18 LAB — CMP14+EGFR
ALT: 51 IU/L — ABNORMAL HIGH (ref 0–44)
AST: 25 IU/L (ref 0–40)
Albumin/Globulin Ratio: 2.4 — ABNORMAL HIGH (ref 1.2–2.2)
Albumin: 4.7 g/dL (ref 3.8–4.9)
Alkaline Phosphatase: 47 IU/L (ref 44–121)
BUN/Creatinine Ratio: 12 (ref 9–20)
BUN: 10 mg/dL (ref 6–24)
Bilirubin Total: 0.3 mg/dL (ref 0.0–1.2)
CO2: 21 mmol/L (ref 20–29)
Calcium: 10.1 mg/dL (ref 8.7–10.2)
Chloride: 103 mmol/L (ref 96–106)
Creatinine, Ser: 0.82 mg/dL (ref 0.76–1.27)
Globulin, Total: 2 g/dL (ref 1.5–4.5)
Glucose: 103 mg/dL — ABNORMAL HIGH (ref 70–99)
Potassium: 4.9 mmol/L (ref 3.5–5.2)
Sodium: 140 mmol/L (ref 134–144)
Total Protein: 6.7 g/dL (ref 6.0–8.5)
eGFR: 102 mL/min/{1.73_m2} (ref >59)

## 2022-02-19 ENCOUNTER — Other Ambulatory Visit: Payer: Self-pay | Admitting: Family

## 2022-02-19 DIAGNOSIS — J439 Emphysema, unspecified: Secondary | ICD-10-CM

## 2022-03-18 ENCOUNTER — Encounter: Payer: Self-pay | Admitting: Family

## 2022-03-18 ENCOUNTER — Ambulatory Visit: Payer: BC Managed Care – PPO | Admitting: Family

## 2022-03-18 VITALS — BP 126/76 | HR 86 | Temp 97.6°F | Ht 74.0 in | Wt 252.0 lb

## 2022-03-18 DIAGNOSIS — F1129 Opioid dependence with unspecified opioid-induced disorder: Secondary | ICD-10-CM | POA: Diagnosis not present

## 2022-03-18 DIAGNOSIS — G8929 Other chronic pain: Secondary | ICD-10-CM | POA: Diagnosis not present

## 2022-03-18 DIAGNOSIS — E559 Vitamin D deficiency, unspecified: Secondary | ICD-10-CM

## 2022-03-18 DIAGNOSIS — E1169 Type 2 diabetes mellitus with other specified complication: Secondary | ICD-10-CM

## 2022-03-18 DIAGNOSIS — I2510 Atherosclerotic heart disease of native coronary artery without angina pectoris: Secondary | ICD-10-CM

## 2022-03-18 DIAGNOSIS — J439 Emphysema, unspecified: Secondary | ICD-10-CM

## 2022-03-18 DIAGNOSIS — E119 Type 2 diabetes mellitus without complications: Secondary | ICD-10-CM

## 2022-03-18 DIAGNOSIS — M5442 Lumbago with sciatica, left side: Secondary | ICD-10-CM

## 2022-03-18 DIAGNOSIS — Z9861 Coronary angioplasty status: Secondary | ICD-10-CM

## 2022-03-18 DIAGNOSIS — Z79899 Other long term (current) drug therapy: Secondary | ICD-10-CM

## 2022-03-18 DIAGNOSIS — E785 Hyperlipidemia, unspecified: Secondary | ICD-10-CM

## 2022-03-18 DIAGNOSIS — Z789 Other specified health status: Secondary | ICD-10-CM

## 2022-03-18 DIAGNOSIS — E7800 Pure hypercholesterolemia, unspecified: Secondary | ICD-10-CM

## 2022-03-18 DIAGNOSIS — E669 Obesity, unspecified: Secondary | ICD-10-CM

## 2022-03-18 DIAGNOSIS — I10 Essential (primary) hypertension: Secondary | ICD-10-CM

## 2022-03-18 LAB — BAYER DCA HB A1C WAIVED: HB A1C (BAYER DCA - WAIVED): 6 % — ABNORMAL HIGH (ref 4.8–5.6)

## 2022-03-18 MED ORDER — GLIPIZIDE 10 MG PO TABS: 10.0000 mg | ORAL_TABLET | Freq: Two times a day (BID) | ORAL | 1 refills | Status: DC

## 2022-03-18 MED ORDER — AMLODIPINE BESYLATE 5 MG PO TABS: 5.0000 mg | ORAL_TABLET | Freq: Every day | ORAL | 0 refills | Status: DC

## 2022-03-18 MED ORDER — TRESIBA FLEXTOUCH 100 UNIT/ML ~~LOC~~ SOPN: 10.0000 [IU] | PEN_INJECTOR | Freq: Every day | SUBCUTANEOUS | 1 refills | Status: DC

## 2022-03-18 MED ORDER — EZETIMIBE 10 MG PO TABS: 10.0000 mg | ORAL_TABLET | Freq: Every day | ORAL | 3 refills | Status: DC

## 2022-03-18 MED ORDER — METFORMIN HCL ER 500 MG PO TB24: 500.0000 mg | ORAL_TABLET | Freq: Every day | ORAL | 2 refills | Status: DC

## 2022-03-18 MED ORDER — OXYCODONE HCL 10 MG PO TABS: 10.0000 mg | ORAL_TABLET | Freq: Every day | ORAL | 0 refills | Status: DC | PRN

## 2022-03-18 MED ORDER — SEMAGLUTIDE(0.25 OR 0.5MG/DOS) 2 MG/3ML ~~LOC~~ SOPN: 0.2500 mg | PEN_INJECTOR | SUBCUTANEOUS | 0 refills | Status: DC

## 2022-03-18 MED ORDER — SYMBICORT 160-4.5 MCG/ACT IN AERO: INHALATION_SPRAY | RESPIRATORY_TRACT | 2 refills | Status: DC

## 2022-03-18 MED ORDER — OXYCODONE HCL 10 MG PO TABS: 10.0000 mg | ORAL_TABLET | Freq: Every morning | ORAL | 0 refills | Status: DC

## 2022-03-18 MED ORDER — SEMAGLUTIDE(0.25 OR 0.5MG/DOS) 2 MG/3ML ~~LOC~~ SOPN: 0.5000 mg | PEN_INJECTOR | SUBCUTANEOUS | 2 refills | Status: DC

## 2022-03-18 MED ORDER — LISINOPRIL 10 MG PO TABS: 10.0000 mg | ORAL_TABLET | Freq: Every day | ORAL | 3 refills | Status: DC

## 2022-03-18 NOTE — Patient Instructions (Signed)

## 2022-03-18 NOTE — Progress Notes (Signed)
Subjective:    Patient ID: William Carson, male    DOB: 1966-03-14, 56 y.o.   MRN: [id number]  Chief Complaint  Patient presents with   Medical Management of Chronic Issues   PT presents to the office today for chronic follow up. PT was followed by Cardiologists annually for CAD. States he has not seen them since COVID.    He has COPD and states his breathing is stable. He states he quit smoking 08/22/21. He reports his breathing is doing slightly better. He reports he taking Symbicort  BID. States his breathing is worse in the hot weather.  Hypertension This is a chronic problem. The current episode started more than 1 year ago. The problem has been resolved since onset. The problem is controlled. Associated symptoms include malaise/fatigue and shortness of breath. Pertinent negatives include no blurred vision or peripheral edema. Risk factors for coronary artery disease include dyslipidemia, diabetes mellitus, obesity, male gender and sedentary lifestyle. The current treatment provides moderate improvement. There is no history of CVA or heart failure.  Diabetes He presents for his follow-up diabetic visit. He has type 2 diabetes mellitus. Associated symptoms include weakness. Pertinent negatives for diabetes include no blurred vision and no foot paresthesias. Symptoms are stable. Pertinent negatives for diabetic complications include no CVA. Risk factors for coronary artery disease include dyslipidemia, diabetes mellitus, male sex, hypertension and sedentary lifestyle. He is following a generally unhealthy diet. His overall blood glucose range is 140-180 mg/dl. Eye exam is not current.  Hyperlipidemia This is a chronic problem. The current episode started more than 1 year ago. The problem is controlled. Recent lipid tests were reviewed and are normal. Exacerbating diseases include obesity. Associated symptoms include leg pain and shortness of breath. Current antihyperlipidemic treatment includes  ezetimibe. The current treatment provides moderate improvement of lipids. Risk factors for coronary artery disease include dyslipidemia, diabetes mellitus, hypertension, male sex and a sedentary lifestyle.  Back Pain This is a chronic problem. The current episode started more than 1 year ago. The problem has been waxing and waning since onset. The pain is present in the lumbar spine. The quality of the pain is described as aching. The pain is at a severity of 10/10. The pain is moderate. The symptoms are aggravated by standing. Associated symptoms include leg pain and weakness. He has tried analgesics and bed rest for the symptoms. The treatment provided mild relief.    Current opioids rx- Oxycodone 10 mg  # meds rx- 30 Effectiveness of current meds-stable  Adverse reactions from pain meds-stable Morphine equivalent- 15   Pill count performed-No Last drug screen - 09/13/21 ( high risk q34m moderate risk q674mlow risk yearly ) Urine drug screen today- yes Was [place]- yes             If yes were their any concerning findings? - none   Pain contract signed on: 09/13/21  Review of Systems  Constitutional:  Positive for malaise/fatigue.  Eyes:  Negative for blurred vision.  Respiratory:  Positive for shortness of breath.   Musculoskeletal:  Positive for back pain.  Neurological:  Positive for weakness.  All other systems reviewed and are negative.      Objective:   Physical Exam Vitals reviewed.  Constitutional:      General: He is not in acute distress.    Appearance: He is well-developed. He is obese.  HENT:     Head: Normocephalic.     Right Ear: Tympanic membrane normal.  Left Ear: Tympanic membrane normal.  Eyes:     General:        Right eye: No discharge.        Left eye: No discharge.     Pupils: Pupils are equal, round, and reactive to light.  Neck:     Thyroid: No thyromegaly.  Cardiovascular:     Rate and Rhythm: Normal rate and regular rhythm.      Heart sounds: Normal heart sounds. No murmur heard. Pulmonary:     Effort: Pulmonary effort is normal. No respiratory distress.     Breath sounds: Wheezing and rhonchi present.  Abdominal:     General: Bowel sounds are normal. There is no distension.     Palpations: Abdomen is soft.     Tenderness: There is no abdominal tenderness.  Musculoskeletal:        General: No tenderness. Normal range of motion.     Cervical back: Normal range of motion and neck supple.  Skin:    General: Skin is warm and dry.     Findings: No erythema or rash.  Neurological:     Mental Status: He is alert and oriented to person, place, and time.     Cranial Nerves: No cranial nerve deficit.     Deep Tendon Reflexes: Reflexes are normal and symmetric.  Psychiatric:        Behavior: Behavior normal.        Thought Content: Thought content normal.        Judgment: Judgment normal.      BP 126/76   Pulse 86   Temp 97.6 F (36.4 C) (Temporal)   Ht 6' 2" (1.88 m)   Wt 252 lb (114.3 kg)   SpO2 94%   BMI 32.35 kg/m      Assessment & Plan:  ANGLE KAREL comes in today with chief complaint of Medical Management of Chronic Issues   Diagnosis and orders addressed:  1. Chronic left-sided low back pain with left-sided sciatica - Oxycodone HCl 10 MG TABS; Take 1 tablet (10 mg total) by mouth daily as needed.  Dispense: 30 tablet; Refill: 0 - CMP14+EGFR - CBC with Differential/Platelet - Oxycodone HCl 10 MG TABS; Take 1 tablet (10 mg total) by mouth in the morning.  Dispense: 30 tablet; Refill: 0 - Oxycodone HCl 10 MG TABS; Take 1 tablet (10 mg total) by mouth daily as needed.  Dispense: 30 tablet; Refill: 0  2. Controlled substance agreement signed - Oxycodone HCl 10 MG TABS; Take 1 tablet (10 mg total) by mouth daily as needed.  Dispense: 30 tablet; Refill: 0 - CMP14+EGFR - CBC with Differential/Platelet - Oxycodone HCl 10 MG TABS; Take 1 tablet (10 mg total) by mouth in the morning.  Dispense: 30  tablet; Refill: 0 - Oxycodone HCl 10 MG TABS; Take 1 tablet (10 mg total) by mouth daily as needed.  Dispense: 30 tablet; Refill: 0  3. Opioid dependence with opioid-induced disorder (HCC) - Oxycodone HCl 10 MG TABS; Take 1 tablet (10 mg total) by mouth daily as needed.  Dispense: 30 tablet; Refill: 0 - CMP14+EGFR - CBC with Differential/Platelet - Oxycodone HCl 10 MG TABS; Take 1 tablet (10 mg total) by mouth in the morning.  Dispense: 30 tablet; Refill: 0 - Oxycodone HCl 10 MG TABS; Take 1 tablet (10 mg total) by mouth daily as needed.  Dispense: 30 tablet; Refill: 0  4. Essential hypertension - amLODipine (NORVASC) 5 MG tablet; Take 1 tablet (5 mg total)  by mouth daily.  Dispense: 90 tablet; Refill: 0 - lisinopril (ZESTRIL) 10 MG tablet; Take 1 tablet (10 mg total) by mouth daily.  Dispense: 90 tablet; Refill: 3 - CMP14+EGFR - CBC with Differential/Platelet  5. Type 2 diabetes mellitus without complication, without long-term current use of insulin ([place]) Will add Ozempic. He will slowly decrease Tresiba  Low carb diet - lisinopril (ZESTRIL) 10 MG tablet; Take 1 tablet (10 mg total) by mouth daily.  Dispense: 90 tablet; Refill: 3 - metFORMIN (GLUCOPHAGE-XR) 500 MG 24 hr tablet; Take 1 tablet (500 mg total) by mouth daily with breakfast.  Dispense: 90 tablet; Refill: 2 - insulin degludec (TRESIBA FLEXTOUCH) 100 UNIT/ML FlexTouch Pen; Inject 10 Units into the skin daily.  Dispense: 9 mL; Refill: 1 - glipiZIDE (GLUCOTROL) 10 MG tablet; Take 1 tablet (10 mg total) by mouth 2 (two) times daily before a meal.  Dispense: 14 tablet; Refill: 1 - CMP14+EGFR - CBC with Differential/Platelet - Bayer DCA Hb A1c Waived - Microalbumin / creatinine urine ratio - Semaglutide,0.25 or 0.5MG/DOS, 2 MG/3ML SOPN; Inject 0.25 mg into the skin once a week.  Dispense: 3 mL; Refill: 0 - Semaglutide,0.25 or 0.5MG/DOS, 2 MG/3ML SOPN; Inject 0.5 mg into the skin once a week.  Dispense: 3 mL; Refill: 2  6.  Pulmonary emphysema, unspecified emphysema type ([place]) - SYMBICORT 160-4.5 MCG/ACT inhaler; INHALE 2 PUFFS INTO THE LUNGS 2 TIMES A DAY  Dispense: 10.2 g; Refill: 2 - CMP14+EGFR - CBC with Differential/Platelet  7. Pure hypercholesterolemia - ezetimibe (ZETIA) 10 MG tablet; Take 1 tablet (10 mg total) by mouth daily.  Dispense: 90 tablet; Refill: 3 - CMP14+EGFR - CBC with Differential/Platelet  8. Hyperlipidemia associated with type 2 diabetes mellitus (HCC) - CMP14+EGFR - CBC with Differential/Platelet  9. Statin intolerance - CMP14+EGFR - CBC with Differential/Platelet  10. Obesity (BMI 30-39.9) - CMP14+EGFR - CBC with Differential/Platelet  11. Vitamin D deficiency - CMP14+EGFR - CBC with Differential/Platelet  12. CAD S/P percutaneous coronary angioplasty  - CMP14+EGFR - CBC with Differential/Platelet   Labs pending Health Maintenance reviewed Diet and exercise encouraged  Follow up plan: 3 months    [name], FNP

## 2022-03-19 ENCOUNTER — Other Ambulatory Visit: Payer: Self-pay | Admitting: Family

## 2022-03-19 LAB — CBC WITH DIFFERENTIAL/PLATELET
Basophils Absolute: 0.1 10*3/uL (ref 0.0–0.2)
Basos: 1 %
EOS (ABSOLUTE): 0.2 10*3/uL (ref 0.0–0.4)
Eos: 2 %
Hematocrit: 50.8 % (ref 37.5–51.0)
Hemoglobin: 17.4 g/dL (ref 13.0–17.7)
Immature Grans (Abs): 0 10*3/uL (ref 0.0–0.1)
Immature Granulocytes: 0 %
Lymphocytes Absolute: 2.3 10*3/uL (ref 0.7–3.1)
Lymphs: 22 %
MCH: 31 pg (ref 26.6–33.0)
MCHC: 34.3 g/dL (ref 31.5–35.7)
MCV: 91 fL (ref 79–97)
Monocytes Absolute: 0.9 10*3/uL (ref 0.1–0.9)
Monocytes: 9 %
Neutrophils Absolute: 7 10*3/uL (ref 1.4–7.0)
Neutrophils: 66 %
Platelets: 326 10*3/uL (ref 150–450)
RBC: 5.61 x10E6/uL (ref 4.14–5.80)
RDW: 13 % (ref 11.6–15.4)
WBC: 10.6 10*3/uL (ref 3.4–10.8)

## 2022-03-19 LAB — CMP14+EGFR
ALT: 38 IU/L (ref 0–44)
AST: 19 IU/L (ref 0–40)
Albumin/Globulin Ratio: 2.3 — ABNORMAL HIGH (ref 1.2–2.2)
Albumin: 4.9 g/dL (ref 3.8–4.9)
Alkaline Phosphatase: 50 IU/L (ref 44–121)
BUN/Creatinine Ratio: 11 (ref 9–20)
BUN: 12 mg/dL (ref 6–24)
Bilirubin Total: 0.4 mg/dL (ref 0.0–1.2)
CO2: 22 mmol/L (ref 20–29)
Calcium: 9.9 mg/dL (ref 8.7–10.2)
Chloride: 103 mmol/L (ref 96–106)
Creatinine, Ser: 1.1 mg/dL (ref 0.76–1.27)
Globulin, Total: 2.1 g/dL (ref 1.5–4.5)
Glucose: 92 mg/dL (ref 70–99)
Potassium: 4.5 mmol/L (ref 3.5–5.2)
Sodium: 143 mmol/L (ref 134–144)
Total Protein: 7 g/dL (ref 6.0–8.5)
eGFR: 78 mL/min/{1.73_m2} (ref >59)

## 2022-03-19 LAB — MICROALBUMIN / CREATININE URINE RATIO
Creatinine, Urine: 184.8 mg/dL
Microalb/Creat Ratio: 6 mg/g creat (ref 0–29)
Microalbumin, Urine: 11.6 ug/mL

## 2022-03-27 ENCOUNTER — Ambulatory Visit: Payer: BC Managed Care – PPO | Admitting: Cardiology

## 2022-05-01 ENCOUNTER — Ambulatory Visit: Payer: BC Managed Care – PPO | Admitting: Nurse Practitioner

## 2022-05-01 ENCOUNTER — Encounter: Payer: Self-pay | Admitting: Nurse Practitioner

## 2022-05-01 VITALS — BP 118/74 | HR 85 | Temp 98.6°F | Ht 74.0 in | Wt 248.8 lb

## 2022-05-01 DIAGNOSIS — B379 Candidiasis, unspecified: Secondary | ICD-10-CM | POA: Diagnosis not present

## 2022-05-01 DIAGNOSIS — B3789 Other sites of candidiasis: Secondary | ICD-10-CM | POA: Insufficient documentation

## 2022-05-01 MED ORDER — MUPIROCIN CALCIUM 2 % EX CREA: 1.0000 | TOPICAL_CREAM | Freq: Two times a day (BID) | CUTANEOUS | 0 refills | Status: DC

## 2022-05-01 MED ORDER — FLUCONAZOLE 50 MG PO TABS: 50.0000 mg | ORAL_TABLET | Freq: Every day | ORAL | 0 refills | Status: DC

## 2022-05-01 NOTE — Patient Instructions (Signed)
Rash, Adult  A rash is a change in the color of your skin. A rash can also change the way your skin feels. There are many different conditions and factors that can cause a rash. Follow these instructions at home: The goal of treatment is to stop the itching and keep the rash from spreading. Watch for any changes in your symptoms. Let your doctor know about them. Follow these instructions to help with your condition: Medicine Take or apply over-the-counter and prescription medicines only as told by your doctor. These may include medicines: To treat red or swollen skin (corticosteroid creams). To treat itching. To treat an allergy (oral antihistamines). To treat very bad symptoms (oral corticosteroids).  Skin care Put cool cloths (compresses) on the affected areas. Do not scratch or rub your skin. Avoid covering the rash. Make sure that the rash is exposed to air as much as possible. Managing itching and discomfort Avoid hot showers or baths. These can make itching worse. A cold shower may help. Try taking a bath with: Epsom salts. You can get these at your local pharmacy or grocery store. Follow the instructions on the package. Baking soda. Pour a small amount into the bath as told by your doctor. Colloidal oatmeal. You can get this at your local pharmacy or grocery store. Follow the instructions on the package. Try putting baking soda paste onto your skin. Stir water into baking soda until it gets like a paste. Try putting on a lotion that relieves itchiness (calamine lotion). Keep cool and out of the sun. Sweating and being hot can make itching worse. General instructions  Rest as needed. Drink enough fluid to keep your pee (urine) pale yellow. Wear loose-fitting clothing. Avoid scented soaps, detergents, and perfumes. Use gentle soaps, detergents, perfumes, and other cosmetic products. Avoid anything that causes your rash. Keep a journal to help track what causes your rash. Write  down: What you eat. What cosmetic products you use. What you drink. What you wear. This includes jewelry. Keep all follow-up visits as told by your doctor. This is important. Contact a doctor if: You sweat at night. You lose weight. You pee (urinate) more than normal. You pee less than normal, or you notice that your pee is a darker color than normal. You feel weak. You throw up (vomit). Your skin or the whites of your eyes look yellow (jaundice). Your skin: Tingles. Is numb. Your rash: Does not go away after a few days. Gets worse. You are: More thirsty than normal. More tired than normal. You have: New symptoms. Pain in your belly (abdomen). A fever. Watery poop (diarrhea). Get help right away if: You have a fever and your symptoms suddenly get worse. You start to feel mixed up (confused). You have a very bad headache or a stiff neck. You have very bad joint pains or stiffness. You have jerky movements that you cannot control (seizure). Your rash covers all or most of your body. The rash may or may not be painful. You have blisters that: Are on top of the rash. Grow larger. Grow together. Are painful. Are inside your nose or mouth. You have a rash that: Looks like purple pinprick-sized spots all over your body. Has a "bull's eye" or looks like a target. Is red and painful, causes your skin to peel, and is not from being in the sun too long. Summary A rash is a change in the color of your skin. A rash can also change the way your skin   feels. The goal of treatment is to stop the itching and keep the rash from spreading. Take or apply over-the-counter and prescription medicines only as told by your doctor. Contact a doctor if you have new symptoms or symptoms that get worse. Keep all follow-up visits as told by your doctor. This is important. This information is not intended to replace advice given to you by your health care provider. Make sure you discuss any  questions you have with your health care provider. Document Revised: 04/19/2021 Document Reviewed: 04/19/2021 Elsevier Patient Education  2023 Elsevier Inc.  

## 2022-05-01 NOTE — Progress Notes (Signed)
Acute Office Visit  Subjective:     Patient ID: William Carson, male    DOB: May 05, 1966, 56 y.o.   MRN: [id number]  Chief Complaint  Patient presents with   Rash    Under belly and in groin area - been there about 3 weeks very itchy     Rash This is a recurrent problem. The problem is unchanged. The affected locations include the groin and abdomen. The rash is characterized by burning, dryness, itchiness and redness. Pertinent negatives include no congestion, cough, facial edema, fever, joint pain or shortness of breath. Treatments tried: over the counter antifungal. The treatment provided no relief.     Review of Systems  Constitutional:  Negative for fever.  HENT: Negative.  Negative for congestion.   Eyes: Negative.   Respiratory:  Negative for cough and shortness of breath.   Cardiovascular: Negative.   Genitourinary: Negative.   Musculoskeletal:  Negative for joint pain.  Skin:  Positive for rash.  All other systems reviewed and are negative.       Objective:    BP 118/74   Pulse 85   Temp 98.6 F (37 C)   Ht '6\' 2"'$  (1.88 m)   Wt 248 lb 12.8 oz (112.9 kg)   SpO2 96%   BMI 31.94 kg/m  BP Readings from Last 3 Encounters:  05/01/22 118/74  03/18/22 126/76  01/17/22 130/78   Wt Readings from Last 3 Encounters:  05/01/22 248 lb 12.8 oz (112.9 kg)  03/18/22 252 lb (114.3 kg)  01/17/22 258 lb 6.4 oz (117.2 kg)      Physical Exam Vitals and nursing note reviewed.  Constitutional:      Appearance: Normal appearance.  HENT:     Head: Normocephalic.     Right Ear: External ear normal.     Left Ear: External ear normal.     Nose: Nose normal.  Eyes:     Conjunctiva/sclera: Conjunctivae normal.  Cardiovascular:     Rate and Rhythm: Normal rate.     Pulses: Normal pulses.     Heart sounds: Normal heart sounds.  Pulmonary:     Effort: Pulmonary effort is normal.     Breath sounds: Normal breath sounds.  Abdominal:     General: Bowel sounds are normal.   Skin:    Findings: Rash present. Rash is urticarial.     Comments: Yeast rash groin area and lower abdominal area  Neurological:     Mental Status: He is alert.     No results found for any visits on 05/01/22.      Assessment & Plan:  Patient presents with fungal skin rash symptoms not improving in the past few weeks.  Patient has tried Lotrimin over-the-counter with no improvement.  Patient has a history of diabetes and is currently on Jardiance. Keep skin dry, avoid scratching with nails, fluconazole 50 mg tablet by mouth daily, mupirocin topical cream.  Problem List Items Addressed This Visit   None Visit Diagnoses     Candida albicans infection    -  Primary   Relevant Medications   fluconazole (DIFLUCAN) 50 MG tablet   mupirocin cream (BACTROBAN) 2 %       Meds ordered this encounter  Medications   fluconazole (DIFLUCAN) 50 MG tablet    Sig: Take 1 tablet (50 mg total) by mouth daily.    Dispense:  14 tablet    Refill:  0    Order Specific Question:   Supervising Provider  Answer:   Claretta Fraise [[id number]]   mupirocin cream (BACTROBAN) 2 %    Sig: Apply 1 Application topically 2 (two) times daily.    Dispense:  15 g    Refill:  0    Order Specific Question:   Supervising Provider    Answer:   Claretta Fraise [[id number]]    Return if symptoms worsen or fail to improve, for rash.  Ivy Lynn, NP

## 2022-05-06 ENCOUNTER — Telehealth: Payer: Self-pay | Admitting: Family

## 2022-05-06 NOTE — Telephone Encounter (Signed)
Please have patient schedule an appointment with PCP to reevaluate his diabetic medication. It may be triggered by Ozempic as a diabetic. Or his other diabetic medication as we reviewed at the time of his clinic visit. Thanks

## 2022-05-06 NOTE — Telephone Encounter (Signed)
Seen Je for rash please advise

## 2022-05-06 NOTE — Telephone Encounter (Signed)
Patient was seen on 10/11 for a rash, was given medicine and he said he thinks that it was helping but when he took his Ozempic shot yesterday 10/15, the rash got worse. Offered an appt and he did not want to come in. Please call back and advise.

## 2022-05-07 ENCOUNTER — Telehealth: Payer: Self-pay | Admitting: Family

## 2022-05-07 ENCOUNTER — Other Ambulatory Visit: Payer: Self-pay | Admitting: Nurse Practitioner

## 2022-05-07 DIAGNOSIS — B379 Candidiasis, unspecified: Secondary | ICD-10-CM

## 2022-05-07 MED ORDER — MUPIROCIN CALCIUM 2 % EX CREA: 1.0000 | TOPICAL_CREAM | Freq: Two times a day (BID) | CUTANEOUS | 0 refills | Status: DC

## 2022-05-07 NOTE — Telephone Encounter (Signed)
Called and told patient William Carson would be back Thursday to advise this. Also spoke with Almyra Free she would like for [place] to talk to her about this reaction as well

## 2022-05-07 NOTE — Telephone Encounter (Signed)
Patient called back to check on issue and made appt for 10/24. Patient would like to know what he needs to do about diabetic medicine for Sunday 10/15. Please call back and advise.

## 2022-05-07 NOTE — Telephone Encounter (Signed)
I sent a refill to pharmacy.

## 2022-05-07 NOTE — Telephone Encounter (Signed)
In the mean time can he get a refill of the cream you sent in for him?

## 2022-05-08 NOTE — Telephone Encounter (Signed)
Pt aware refill sent in.

## 2022-05-09 NOTE — Telephone Encounter (Signed)
Patient aware and verbalizes understanding.  States he is not going to take the ozempic due to a burning, itching rash from the waste to groin.

## 2022-05-09 NOTE — Telephone Encounter (Signed)
Have him continue the Ozempic. Keep clean and dry. Keep follow up with me.

## 2022-05-09 NOTE — Telephone Encounter (Signed)
OK 

## 2022-05-14 ENCOUNTER — Ambulatory Visit: Payer: BC Managed Care – PPO | Admitting: Family

## 2022-05-14 ENCOUNTER — Encounter: Payer: Self-pay | Admitting: Family

## 2022-05-14 VITALS — BP 128/64 | HR 109 | Temp 97.6°F | Ht 74.0 in | Wt 243.4 lb

## 2022-05-14 DIAGNOSIS — B372 Candidiasis of skin and nail: Secondary | ICD-10-CM | POA: Diagnosis not present

## 2022-05-14 MED ORDER — FLUCONAZOLE 150 MG PO TABS: 150.0000 mg | ORAL_TABLET | ORAL | 0 refills | Status: DC | PRN

## 2022-05-14 MED ORDER — NYSTATIN 100000 UNIT/GM EX POWD: 1.0000 | Freq: Three times a day (TID) | CUTANEOUS | 0 refills | Status: DC

## 2022-05-14 MED ORDER — KETOCONAZOLE 2 % EX CREA: 1.0000 | TOPICAL_CREAM | Freq: Every day | CUTANEOUS | 0 refills | Status: DC

## 2022-05-14 NOTE — Patient Instructions (Signed)
Skin Yeast Infection  A skin yeast infection is a condition in which there is an overgrowth of yeast (Candida) that normally lives on the skin. This condition usually occurs in areas of the skin that are constantly warm and moist, such as the skin under the breasts or armpits, or in the groin and other body folds. What are the causes? This condition is caused by a change in the normal balance of the yeast that live on the skin. What increases the risk? You are more likely to develop this condition if you: Are obese. Are pregnant. Are 65 years of age or older. Wear tight clothing. Have any of the following conditions: Diabetes. Malnutrition. A weak body defense system (immune system). Take medicines such as: Birth control pills. Antibiotics. Steroid medicines. What are the signs or symptoms? The most common symptom of this condition is itchiness in the affected area. Other symptoms include: A red, swollen area of the skin. Bumps on the skin. How is this diagnosed? This condition is diagnosed with a medical history and physical exam. Your health care provider may check for yeast by taking scrapings of the skin to be viewed under a microscope. How is this treated? This condition is treated with medicine. Medicines may be prescribed or available over the counter. The medicines may be: Taken by mouth (orally). Applied as a cream or powder to your skin. Follow these instructions at home:  Take or apply over-the-counter and prescription medicines only as told by your health care provider. Maintain a healthy weight. If you need help losing weight, talk with your health care provider. Keep your skin clean and dry. Wear loose-fitting clothing. If you have diabetes, keep your blood sugar under control. Keep all follow-up visits. This is important. Contact a health care provider if: Your symptoms go away and then come back. Your symptoms do not get better with treatment. Your symptoms get  worse. Your rash spreads. You have a fever or chills. You have new symptoms. You have new warmth or redness of your skin. Your rash is painful or bleeding. Summary A skin yeast infection is a condition in which there is an overgrowth of yeast (Candida) that normally lives on the skin. Take or apply over-the-counter and prescription medicines only as told by your health care provider. Keep your skin clean and dry. Contact a health care provider if your symptoms do not get better with treatment. This information is not intended to replace advice given to you by your health care provider. Make sure you discuss any questions you have with your health care provider. Document Revised: 09/26/2020 Document Reviewed: 09/26/2020 Elsevier Patient Education  2023 Elsevier Inc.  

## 2022-05-14 NOTE — Progress Notes (Signed)
Subjective:    Patient ID: William Carson, male    DOB: [birth year], 56 y.o.   MRN: [id number]  Chief Complaint  Patient presents with   Medication Reaction    Thinks he is having rash from shingles    PT presents to the office today with rash on lower abdomen and groin that started two weeks.  Rash This is a new problem. The current episode started 1 to 4 weeks ago. The problem has been waxing and waning since onset. The affected locations include the abdomen and groin. The rash is characterized by blistering, redness and itchiness. He was exposed to nothing. Past treatments include anti-itch cream. The treatment provided mild relief.      Review of Systems  Skin:  Positive for rash.  All other systems reviewed and are negative.      Objective:   Physical Exam Vitals reviewed.  Constitutional:      General: He is not in acute distress.    Appearance: He is well-developed. He is obese.  HENT:     Head: Normocephalic.     Right Ear: Tympanic membrane normal.     Left Ear: Tympanic membrane normal.  Eyes:     General:        Right eye: No discharge.        Left eye: No discharge.     Pupils: Pupils are equal, round, and reactive to light.  Neck:     Thyroid: No thyromegaly.  Cardiovascular:     Rate and Rhythm: Normal rate and regular rhythm.     Heart sounds: Normal heart sounds. No murmur heard. Pulmonary:     Effort: Pulmonary effort is normal. No respiratory distress.     Breath sounds: Normal breath sounds. No wheezing.  Abdominal:     General: Bowel sounds are normal. There is no distension.     Palpations: Abdomen is soft.     Tenderness: There is no abdominal tenderness.  Musculoskeletal:        General: No tenderness. Normal range of motion.     Cervical back: Normal range of motion and neck supple.  Skin:    General: Skin is warm and dry.     Findings: Erythema and rash present.  Neurological:     Mental Status: He is alert and oriented to person,  place, and time.     Cranial Nerves: No cranial nerve deficit.     Deep Tendon Reflexes: Reflexes are normal and symmetric.  Psychiatric:        Behavior: Behavior normal.        Thought Content: Thought content normal.        Judgment: Judgment normal.    BP 128/64   Pulse (!) 109   Temp 97.6 F (36.4 C) (Temporal)   Ht '6\' 2"'$  (1.88 m)   Wt 243 lb 6.4 oz (110.4 kg)   BMI 31.25 kg/m        Assessment & Plan:  William Carson comes in today with chief complaint of Medication Reaction (Thinks he is having rash from shingles )   Diagnosis and orders addressed:  1. Candidal skin infection Keep clean and dry Start Diflucan 150 mg  Ketoconazole and Nystatin powder Needs good control of glucose Follow up if symptoms worsen or do not improve  - ketoconazole (NIZORAL) 2 % cream; Apply 1 Application topically daily.  Dispense: 15 g; Refill: 0 - nystatin (MYCOSTATIN/NYSTOP) powder; Apply 1 Application topically 3 (three) times daily.  Dispense: 15 g; Refill: 0 - fluconazole (DIFLUCAN) 150 MG tablet; Take 1 tablet (150 mg total) by mouth every three (3) days as needed.  Dispense: 3 tablet; Refill: 0  [name], FNP

## 2022-05-20 ENCOUNTER — Telehealth: Payer: Self-pay | Admitting: Family

## 2022-05-20 DIAGNOSIS — B3789 Other sites of candidiasis: Secondary | ICD-10-CM

## 2022-05-20 DIAGNOSIS — R21 Rash and other nonspecific skin eruption: Secondary | ICD-10-CM

## 2022-05-20 NOTE — Telephone Encounter (Signed)
If these are the same fungal spots that were on his lower abdominal he can use the Ketoconazole cream daily on those also.

## 2022-05-20 NOTE — Telephone Encounter (Signed)
Patient aware and verbalized understanding. °

## 2022-05-24 ENCOUNTER — Ambulatory Visit: Payer: BC Managed Care – PPO | Admitting: Family

## 2022-05-24 ENCOUNTER — Encounter: Payer: Self-pay | Admitting: Family

## 2022-05-24 DIAGNOSIS — B372 Candidiasis of skin and nail: Secondary | ICD-10-CM | POA: Diagnosis not present

## 2022-05-24 DIAGNOSIS — M5442 Lumbago with sciatica, left side: Secondary | ICD-10-CM | POA: Diagnosis not present

## 2022-05-24 DIAGNOSIS — Z79899 Other long term (current) drug therapy: Secondary | ICD-10-CM

## 2022-05-24 DIAGNOSIS — F1129 Opioid dependence with unspecified opioid-induced disorder: Secondary | ICD-10-CM

## 2022-05-24 DIAGNOSIS — G8929 Other chronic pain: Secondary | ICD-10-CM

## 2022-05-24 MED ORDER — KETOCONAZOLE 2 % EX CREA: 1.0000 | TOPICAL_CREAM | Freq: Every day | CUTANEOUS | 1 refills | Status: DC

## 2022-05-24 MED ORDER — OXYCODONE HCL 10 MG PO TABS: 10.0000 mg | ORAL_TABLET | Freq: Every day | ORAL | 0 refills | Status: DC | PRN

## 2022-05-24 MED ORDER — OXYCODONE HCL 10 MG PO TABS: 10.0000 mg | ORAL_TABLET | Freq: Every morning | ORAL | 0 refills | Status: DC

## 2022-05-24 MED ORDER — FLUCONAZOLE 150 MG PO TABS: 150.0000 mg | ORAL_TABLET | Freq: Every day | ORAL | 0 refills | Status: DC

## 2022-05-24 NOTE — Patient Instructions (Signed)
Body Ringworm Body ringworm is an infection of the skin that often causes a ring-shaped rash. Body ringworm is also called tinea corporis. Body ringworm can affect any part of your skin. This condition is easily spread from person to person (is very contagious). What are the causes? This condition is caused by fungi called dermatophytes. The condition develops when these fungi grow out of control on the skin. You can get this condition if you touch a person or animal that has it. You can also get it if you share any items with an infected person or pet. These include: Clothing, bedding, and towels. Brushes or combs. Gym equipment. Any other object that has the fungus on it. What increases the risk? You are more likely to develop this condition if you: Play sports that involve close physical contact, such as wrestling. Sweat a lot. Live in areas that are hot and humid. Use public showers. Have a weakened disease-fighting system (immune system). What are the signs or symptoms? Symptoms of this condition include: Itchy, raised red spots and bumps. Red scaly patches. A ring-shaped rash. The rash may have: A clear center. Scales or red bumps at its center. Redness near its borders. Dry and scaly skin on or around it. How is this diagnosed? This condition can usually be diagnosed with a skin exam. A skin scraping may be taken from the affected area and examined under a microscope to see if the fungus is present. How is this treated? This condition may be treated with: An antifungal cream or ointment. An antifungal shampoo. Antifungal medicines. These may be prescribed if your ringworm: Is severe. Keeps coming back or lasts a long time. Follow these instructions at home: Take over-the-counter and prescription medicines only as told by your health care provider. If you were given an antifungal cream or ointment: Use it as told by your health care provider. Wash the infected area and  dry it completely before applying the cream or ointment. If you were given an antifungal shampoo: Use it as told by your health care provider. Leave the shampoo on your body for 3-5 minutes before rinsing. While you have a rash: Wear loose clothing to stop clothes from rubbing and irritating it. Wash or change your bed sheets every night. Wash clothes and bed sheets in hot water. Disinfect or throw out items that may be infected. Wash your hands often with soap and water for at least 20 seconds. If soap and water are not available, use hand sanitizer. If your pet has the same infection, take your pet to see a veterinarian for treatment. How is this prevented? Take a bath or shower every day and after every time you work out or play sports. Dry your skin completely after bathing. Wear sandals or shoes in public places and showers. Wash athletic clothes after each use. Do not share personal items with others. Avoid touching red patches of skin on other people. Avoid touching pets that have bald spots. If you touch an animal that has a bald spot, wash your hands. Contact a health care provider if: Your rash continues to spread after 7 days of treatment. Your rash is not gone in 4 weeks. The area around your rash gets red, warm, tender, and swollen. This information is not intended to replace advice given to you by your health care provider. Make sure you discuss any questions you have with your health care provider. Document Revised: 12/20/2021 Document Reviewed: 12/20/2021 Elsevier Patient Education  2023 Elsevier Inc.  

## 2022-05-24 NOTE — Progress Notes (Signed)
Subjective:    Patient ID: William Carson, male    DOB: 04-22-1966, 56 y.o.   MRN: [id number]  Chief Complaint  Patient presents with   Rash   PT presents to the office today with a rash on chest and wrist.  Rash This is a new problem. The current episode started 1 to 4 weeks ago. The problem has been gradually worsening since onset. The affected locations include the abdomen and chest. The rash is characterized by itchiness, pain and redness. He was exposed to nothing. Past treatments include anti-itch cream. The treatment provided mild relief.  Back Pain This is a chronic problem. The current episode started more than 1 year ago. The problem occurs intermittently. The problem has been waxing and waning since onset. The pain is present in the lumbar spine. The quality of the pain is described as aching. The pain is at a severity of 5/10. The pain is moderate. He has tried analgesics for the symptoms. The treatment provided moderate relief.    Current opioids rx- Oxycodone 10 mg  # meds rx- 30 Effectiveness of current meds-stable  Adverse reactions from pain meds-stable Morphine equivalent- 15   Pill count performed-No Last drug screen - 09/13/21 ( high risk q63m moderate risk q641mlow risk yearly ) Urine drug screen today- yes [place]             If yes were their any concerning findings? - none   Pain contract signed on: 09/13/21  Review of Systems  Musculoskeletal:  Positive for back pain.  Skin:  Positive for rash.  All other systems reviewed and are negative.      Objective:   Physical Exam Vitals reviewed.  Constitutional:      General: He is not in acute distress.    Appearance: He is well-developed. He is obese.  HENT:     Head: Normocephalic.  Eyes:     General:        Right eye: No discharge.        Left eye: No discharge.     Pupils: Pupils are equal, round, and reactive to light.  Neck:     Thyroid: No thyromegaly.  Cardiovascular:      Rate and Rhythm: Normal rate and regular rhythm.     Heart sounds: Normal heart sounds. No murmur heard. Pulmonary:     Effort: Pulmonary effort is normal. No respiratory distress.     Breath sounds: Normal breath sounds. No wheezing.  Abdominal:     General: Bowel sounds are normal. There is no distension.     Palpations: Abdomen is soft.     Tenderness: There is no abdominal tenderness.  Musculoskeletal:        General: No tenderness. Normal range of motion.     Cervical back: Normal range of motion and neck supple.  Skin:    General: Skin is warm and dry.     Findings: Bruising and erythema present. No rash.       Neurological:     Mental Status: He is alert and oriented to person, place, and time.     Cranial Nerves: No cranial nerve deficit.     Deep Tendon Reflexes: Reflexes are normal and symmetric.  Psychiatric:        Behavior: Behavior normal.        Thought Content: Thought content normal.        Judgment: Judgment normal.      BP (!Marland Kitchen  147/79   Pulse (!) 105   Temp 98.4 F (36.9 C) (Temporal)   Ht '6\' 2"'$  (1.88 m)   Wt 248 lb (112.5 kg)   BMI 31.84 kg/m       Assessment & Plan:  William Carson comes in today with chief complaint of Rash   Diagnosis and orders addressed:  1. Candidal skin infection Keep clean and dry Avoid scratching Continue ketoconazole  Start diflucan  - fluconazole (DIFLUCAN) 150 MG tablet; Take 1 tablet (150 mg total) by mouth daily.  Dispense: 7 tablet; Refill: 0 - ketoconazole (NIZORAL) 2 % cream; Apply 1 Application topically daily.  Dispense: 30 g; Refill: 1  2. Opioid dependence with opioid-induced disorder (HCC) - Oxycodone HCl 10 MG TABS; Take 1 tablet (10 mg total) by mouth in the morning.  Dispense: 30 tablet; Refill: 0 - Oxycodone HCl 10 MG TABS; Take 1 tablet (10 mg total) by mouth daily as needed.  Dispense: 30 tablet; Refill: 0 - Oxycodone HCl 10 MG TABS; Take 1 tablet (10 mg total) by mouth daily as needed.   Dispense: 30 tablet; Refill: 0  3. Controlled substance agreement signed - Oxycodone HCl 10 MG TABS; Take 1 tablet (10 mg total) by mouth in the morning.  Dispense: 30 tablet; Refill: 0 - Oxycodone HCl 10 MG TABS; Take 1 tablet (10 mg total) by mouth daily as needed.  Dispense: 30 tablet; Refill: 0 - Oxycodone HCl 10 MG TABS; Take 1 tablet (10 mg total) by mouth daily as needed.  Dispense: 30 tablet; Refill: 0  4. Chronic left-sided low back pain with left-sided sciatica - Oxycodone HCl 10 MG TABS; Take 1 tablet (10 mg total) by mouth in the morning.  Dispense: 30 tablet; Refill: 0 - Oxycodone HCl 10 MG TABS; Take 1 tablet (10 mg total) by mouth daily as needed.  Dispense: 30 tablet; Refill: 0 - Oxycodone HCl 10 MG TABS; Take 1 tablet (10 mg total) by mouth daily as needed.  Dispense: 30 tablet; Refill: 0  Health Maintenance reviewed Diet and exercise encouraged Patient reviewed in [place] controlled database, no flags noted. Contract and drug screen are up to date.   Follow up plan: 3 months   William Dun, FNP

## 2022-05-30 MED ORDER — CLOBETASOL PROPIONATE 0.05 % EX CREA: 1.0000 | TOPICAL_CREAM | Freq: Two times a day (BID) | CUTANEOUS | 1 refills | Status: DC

## 2022-05-30 NOTE — Telephone Encounter (Signed)
Referral Dermatologists made. Clobetasol BID.   Pt called and discussed.

## 2022-05-30 NOTE — Addendum Note (Signed)
Addended by: [name] A on: 05/30/2022 03:10 PM   Modules accepted: Orders

## 2022-05-30 NOTE — Telephone Encounter (Signed)
Patient said spots are not getting any better, wants to know if there is anything else that can be done. Please call back and advise.

## 2022-06-06 DIAGNOSIS — L304 Erythema intertrigo: Secondary | ICD-10-CM | POA: Diagnosis not present

## 2022-06-18 ENCOUNTER — Other Ambulatory Visit: Payer: Self-pay | Admitting: Family

## 2022-06-18 ENCOUNTER — Ambulatory Visit: Payer: BC Managed Care – PPO | Admitting: Family

## 2022-06-18 ENCOUNTER — Encounter: Payer: Self-pay | Admitting: Family

## 2022-06-18 VITALS — BP 136/75 | HR 80 | Temp 97.3°F | Ht 74.0 in | Wt 246.4 lb

## 2022-06-18 DIAGNOSIS — E785 Hyperlipidemia, unspecified: Secondary | ICD-10-CM | POA: Diagnosis not present

## 2022-06-18 DIAGNOSIS — Z789 Other specified health status: Secondary | ICD-10-CM

## 2022-06-18 DIAGNOSIS — I10 Essential (primary) hypertension: Secondary | ICD-10-CM

## 2022-06-18 DIAGNOSIS — E559 Vitamin D deficiency, unspecified: Secondary | ICD-10-CM

## 2022-06-18 DIAGNOSIS — Z0000 Encounter for general adult medical examination without abnormal findings: Secondary | ICD-10-CM | POA: Diagnosis not present

## 2022-06-18 DIAGNOSIS — Z0001 Encounter for general adult medical examination with abnormal findings: Secondary | ICD-10-CM | POA: Diagnosis not present

## 2022-06-18 DIAGNOSIS — E1169 Type 2 diabetes mellitus with other specified complication: Secondary | ICD-10-CM

## 2022-06-18 DIAGNOSIS — M5442 Lumbago with sciatica, left side: Secondary | ICD-10-CM

## 2022-06-18 DIAGNOSIS — G8929 Other chronic pain: Secondary | ICD-10-CM

## 2022-06-18 DIAGNOSIS — E669 Obesity, unspecified: Secondary | ICD-10-CM

## 2022-06-18 DIAGNOSIS — J438 Other emphysema: Secondary | ICD-10-CM

## 2022-06-18 DIAGNOSIS — Z9861 Coronary angioplasty status: Secondary | ICD-10-CM

## 2022-06-18 DIAGNOSIS — I2510 Atherosclerotic heart disease of native coronary artery without angina pectoris: Secondary | ICD-10-CM

## 2022-06-18 LAB — BAYER DCA HB A1C WAIVED: HB A1C (BAYER DCA - WAIVED): 5.7 % — ABNORMAL HIGH (ref 4.8–5.6)

## 2022-06-18 MED ORDER — TRULICITY 0.75 MG/0.5ML ~~LOC~~ SOAJ: 0.7500 mg | SUBCUTANEOUS | 3 refills | Status: DC

## 2022-06-18 NOTE — Patient Instructions (Signed)
Health Maintenance After Age 57 After age 57, you are at a higher risk for certain long-term diseases and infections as well as injuries from falls. Falls are a major cause of broken bones and head injuries in people who are older than age 57. Getting regular preventive care can help to keep you healthy and well. Preventive care includes getting regular testing and making lifestyle changes as recommended by your health care provider. Talk with your health care provider about: Which screenings and tests you should have. A screening is a test that checks for a disease when you have no symptoms. A diet and exercise plan that is right for you. What should I know about screenings and tests to prevent falls? Screening and testing are the best ways to find a health problem early. Early diagnosis and treatment give you the best chance of managing medical conditions that are common after age 57. Certain conditions and lifestyle choices may make you more likely to have a fall. Your health care provider may recommend: Regular vision checks. Poor vision and conditions such as cataracts can make you more likely to have a fall. If you wear glasses, make sure to get your prescription updated if your vision changes. Medicine review. Work with your health care provider to regularly review all of the medicines you are taking, including over-the-counter medicines. Ask your health care provider about any side effects that may make you more likely to have a fall. Tell your health care provider if any medicines that you take make you feel dizzy or sleepy. Strength and balance checks. Your health care provider may recommend certain tests to check your strength and balance while standing, walking, or changing positions. Foot health exam. Foot pain and numbness, as well as not wearing proper footwear, can make you more likely to have a fall. Screenings, including: Osteoporosis screening. Osteoporosis is a condition that causes  the bones to get weaker and break more easily. Blood pressure screening. Blood pressure changes and medicines to control blood pressure can make you feel dizzy. Depression screening. You may be more likely to have a fall if you have a fear of falling, feel depressed, or feel unable to do activities that you used to do. Alcohol use screening. Using too much alcohol can affect your balance and may make you more likely to have a fall. Follow these instructions at home: Lifestyle Do not drink alcohol if: Your health care provider tells you not to drink. If you drink alcohol: Limit how much you have to: 0-1 drink a day for women. 0-2 drinks a day for men. Know how much alcohol is in your drink. In the [place] one drink equals one 12 oz bottle of beer (355 mL), one 5 oz glass of wine (148 mL), or one 1 oz glass of hard liquor (44 mL). Do not use any products that contain nicotine or tobacco. These products include cigarettes, chewing tobacco, and vaping devices, such as e-cigarettes. If you need help quitting, ask your health care provider. Activity  Follow a regular exercise program to stay fit. This will help you maintain your balance. Ask your health care provider what types of exercise are appropriate for you. If you need a cane or walker, use it as recommended by your health care provider. Wear supportive shoes that have nonskid soles. Safety  Remove any tripping hazards, such as rugs, cords, and clutter. Install safety equipment such as grab bars in bathrooms and safety rails on stairs. Keep rooms and walkways   well-lit. General instructions Talk with your health care provider about your risks for falling. Tell your health care provider if: You fall. Be sure to tell your health care provider about all falls, even ones that seem minor. You feel dizzy, tiredness (fatigue), or off-balance. Take over-the-counter and prescription medicines only as told by your health care provider. These include  supplements. Eat a healthy diet and maintain a healthy weight. A healthy diet includes low-fat dairy products, low-fat (lean) meats, and fiber from whole grains, beans, and lots of fruits and vegetables. Stay current with your vaccines. Schedule regular health, dental, and eye exams. Summary Having a healthy lifestyle and getting preventive care can help to protect your health and wellness after age 57. Screening and testing are the best way to find a health problem early and help you avoid having a fall. Early diagnosis and treatment give you the best chance for managing medical conditions that are more common for people who are older than age 57. Falls are a major cause of broken bones and head injuries in people who are older than age 57. Take precautions to prevent a fall at home. Work with your health care provider to learn what changes you can make to improve your health and wellness and to prevent falls. This information is not intended to replace advice given to you by your health care provider. Make sure you discuss any questions you have with your health care provider. Document Revised: 11/27/2020 Document Reviewed: 11/27/2020 Elsevier Patient Education  2023 Elsevier Inc.  

## 2022-06-18 NOTE — Progress Notes (Signed)
Subjective:    Patient ID: William Carson, male    DOB: 10/11/1965, 56 y.o.   MRN: [id number]  Chief Complaint  Patient presents with   Rash   PT presents to the office today for CPE and chronic follow up. PT was followed by Cardiologists annually for CAD. States he has not seen them since COVID.    He has COPD and states his breathing is stable. He states he quit smoking 08/22/21, but restarted. He is smoking a pack a day. He reports his breathing is doing slightly better. He reports he taking Symbicort  BID. States his breathing is worse in the hot weather.  Hypertension This is a chronic problem. The current episode started more than 1 year ago. The problem has been resolved since onset. The problem is controlled. Associated symptoms include malaise/fatigue. Pertinent negatives include no blurred vision, peripheral edema or shortness of breath. Risk factors for coronary artery disease include dyslipidemia, diabetes mellitus, male gender and sedentary lifestyle. The current treatment provides moderate improvement.  Diabetes He presents for his follow-up diabetic visit. He has type 2 diabetes mellitus. There are no hypoglycemic associated symptoms. Pertinent negatives for diabetes include no blurred vision and no foot paresthesias. There are no hypoglycemic complications. Symptoms are stable. Pertinent negatives for diabetic complications include no peripheral neuropathy. Risk factors for coronary artery disease include dyslipidemia, diabetes mellitus, hypertension, sedentary lifestyle and post-menopausal. He is following a generally healthy diet. His overall blood glucose range is 130-140 mg/dl.  Hyperlipidemia This is a chronic problem. The current episode started more than 1 year ago. The problem is uncontrolled. Exacerbating diseases include obesity. Pertinent negatives include no shortness of breath. Current antihyperlipidemic treatment includes statins. The current treatment provides  moderate improvement of lipids. Risk factors for coronary artery disease include dyslipidemia, diabetes mellitus, hypertension and a sedentary lifestyle.  Back Pain This is a chronic problem. The current episode started more than 1 year ago. The problem occurs intermittently. The problem has been waxing and waning since onset. The pain is present in the lumbar spine. The quality of the pain is described as aching. The pain is moderate. He has tried analgesics for the symptoms.      Review of Systems  Constitutional:  Positive for malaise/fatigue.  Eyes:  Negative for blurred vision.  Respiratory:  Negative for shortness of breath.   Musculoskeletal:  Positive for back pain.  All other systems reviewed and are negative.  Family History  Problem Relation Age of Onset   Heart attack Mother        Infarction   Arrhythmia Father        Atrial fibrillation   Heart attack Maternal Grandfather    Heart attack Paternal Grandfather    Social History   Socioeconomic History   Marital status: Married    Spouse name: Not on file   Number of children: 0   Years of education: 12   Highest education level: Not on file  Occupational History   Occupation: Armed forces technical officer: OTHER    Comment: [place], [place]  Tobacco Use   Smoking status: Former    Packs/day: 1.00    Years: 30.00    Total pack years: 30.00    Types: Cigarettes   Smokeless tobacco: Former  Scientific laboratory technician Use: Never used  Substance and Sexual Activity   Alcohol use: Yes    Alcohol/week: 1.0 standard drink of alcohol    Types: 1 Standard drinks  or equivalent per week   Drug use: No   Sexual activity: Yes  Other Topics Concern   Not on file  Social History Narrative   Lives with wife   Right handed    Caffeine use: Coffee every Sunday   Diet-sun drop daily or coke zero   Has been married twice   Married to current wife for 3 years   No children   Social Determinants of Systems developer Strain: Not on file  Food Insecurity: Not on file  Transportation Needs: Not on file  Physical Activity: Not on file  Stress: Not on file  Social Connections: Not on file       Objective:   Physical Exam Vitals reviewed.  Constitutional:      General: He is not in acute distress.    Appearance: He is well-developed.  HENT:     Head: Normocephalic.     Right Ear: Tympanic membrane normal.     Left Ear: Tympanic membrane normal.  Eyes:     General:        Right eye: No discharge.        Left eye: No discharge.     Pupils: Pupils are equal, round, and reactive to light.  Neck:     Thyroid: No thyromegaly.  Cardiovascular:     Rate and Rhythm: Normal rate and regular rhythm.     Heart sounds: Normal heart sounds. No murmur heard. Pulmonary:     Effort: Pulmonary effort is normal. No respiratory distress.     Breath sounds: Normal breath sounds. No wheezing.  Abdominal:     General: Bowel sounds are normal. There is no distension.     Palpations: Abdomen is soft.     Tenderness: There is no abdominal tenderness.  Musculoskeletal:        General: No tenderness. Normal range of motion.     Cervical back: Normal range of motion and neck supple.  Skin:    General: Skin is warm and dry.     Findings: No erythema or rash.  Neurological:     Mental Status: He is alert and oriented to person, place, and time.     Cranial Nerves: No cranial nerve deficit.     Deep Tendon Reflexes: Reflexes are normal and symmetric.  Psychiatric:        Behavior: Behavior normal.        Thought Content: Thought content normal.        Judgment: Judgment normal.       BP 136/75   Pulse 80   Temp (!) 97.3 F (36.3 C) (Temporal)   Ht _0  (1.88 m)   Wt 246 lb 6.4 oz (111.8 kg)   BMI 31.64 kg/m   Assessment & Plan:  William Carson comes in today with chief complaint of Rash   Diagnosis and orders addressed:  1. Type 2 diabetes mellitus with other specified complication,  without long-term current use of insulin (HCC) Will start Trulicity 1.5 mg  Low carb diet - Dulaglutide (TRULICITY) 5.00 XF/8.1WE SOPN; Inject 0.75 mg into the skin once a week.  Dispense: 28 mL; Refill: 3 - CMP14+EGFR - CBC with Differential/Platelet - Bayer DCA Hb A1c Waived  2. Vitamin D deficiency - CMP14+EGFR - CBC with Differential/Platelet - VITAMIN D 25 Hydroxy (Vit-D Deficiency, Fractures)  3. Statin intolerance - CMP14+EGFR - CBC with Differential/Platelet  4. Obesity (BMI 30-39.9)  - CMP14+EGFR - CBC with Differential/Platelet  5.  Hyperlipidemia associated with type 2 diabetes mellitus (HCC) - CMP14+EGFR - CBC with Differential/Platelet - Lipid panel  6. Essential hypertension - CMP14+EGFR - CBC with Differential/Platelet  7. Other emphysema ([place]) - CMP14+EGFR - CBC with Differential/Platelet  8. Chronic left-sided low back pain with left-sided sciatica - CMP14+EGFR - CBC with Differential/Platelet  9. CAD S/P percutaneous coronary angioplasty - CMP14+EGFR - CBC with Differential/Platelet  10. Annual physical exam - CMP14+EGFR - CBC with Differential/Platelet - Bayer DCA Hb A1c Waived - Lipid panel - TSH - VITAMIN D 25 Hydroxy (Vit-D Deficiency, Fractures) - PSA, total and free   Labs pending Health Maintenance reviewed Diet and exercise encouraged  Follow up plan: 3 months    [name], FNP

## 2022-06-19 LAB — CBC WITH DIFFERENTIAL/PLATELET
Basophils Absolute: 0.1 10*3/uL (ref 0.0–0.2)
Basos: 1 %
EOS (ABSOLUTE): 0.2 10*3/uL (ref 0.0–0.4)
Eos: 2 %
Hematocrit: 49.5 % (ref 37.5–51.0)
Hemoglobin: 16.8 g/dL (ref 13.0–17.7)
Immature Grans (Abs): 0 10*3/uL (ref 0.0–0.1)
Immature Granulocytes: 0 %
Lymphocytes Absolute: 2.8 10*3/uL (ref 0.7–3.1)
Lymphs: 26 %
MCH: 30.9 pg (ref 26.6–33.0)
MCHC: 33.9 g/dL (ref 31.5–35.7)
MCV: 91 fL (ref 79–97)
Monocytes Absolute: 1.3 10*3/uL — ABNORMAL HIGH (ref 0.1–0.9)
Monocytes: 12 %
Neutrophils Absolute: 6.2 10*3/uL (ref 1.4–7.0)
Neutrophils: 59 %
Platelets: 317 10*3/uL (ref 150–450)
RBC: 5.44 x10E6/uL (ref 4.14–5.80)
RDW: 13.9 % (ref 11.6–15.4)
WBC: 10.7 10*3/uL (ref 3.4–10.8)

## 2022-06-19 LAB — CMP14+EGFR
ALT: 27 IU/L (ref 0–44)
AST: 14 IU/L (ref 0–40)
Albumin/Globulin Ratio: 2.2 (ref 1.2–2.2)
Albumin: 4.7 g/dL (ref 3.8–4.9)
Alkaline Phosphatase: 47 IU/L (ref 44–121)
BUN/Creatinine Ratio: 14 (ref 9–20)
BUN: 10 mg/dL (ref 6–24)
Bilirubin Total: 0.3 mg/dL (ref 0.0–1.2)
CO2: 23 mmol/L (ref 20–29)
Calcium: 9.8 mg/dL (ref 8.7–10.2)
Chloride: 102 mmol/L (ref 96–106)
Creatinine, Ser: 0.7 mg/dL — ABNORMAL LOW (ref 0.76–1.27)
Globulin, Total: 2.1 g/dL (ref 1.5–4.5)
Glucose: 54 mg/dL — ABNORMAL LOW (ref 70–99)
Potassium: 4.5 mmol/L (ref 3.5–5.2)
Sodium: 140 mmol/L (ref 134–144)
Total Protein: 6.8 g/dL (ref 6.0–8.5)
eGFR: 107 mL/min/{1.73_m2} (ref >59)

## 2022-06-19 LAB — LIPID PANEL
Chol/HDL Ratio: 4.8 ratio (ref 0.0–5.0)
Cholesterol, Total: 153 mg/dL (ref 100–199)
HDL: 32 mg/dL — ABNORMAL LOW (ref >39)
LDL Chol Calc (NIH): 101 mg/dL — ABNORMAL HIGH (ref 0–99)
Triglycerides: 108 mg/dL (ref 0–149)
VLDL Cholesterol Cal: 20 mg/dL (ref 5–40)

## 2022-06-19 LAB — PSA, TOTAL AND FREE
PSA, Free Pct: 12.7 %
PSA, Free: 0.38 ng/mL
Prostate Specific Ag, Serum: 3 ng/mL (ref 0.0–4.0)

## 2022-06-19 LAB — TSH: TSH: 4.1 u[IU]/mL (ref 0.450–4.500)

## 2022-06-19 LAB — VITAMIN D 25 HYDROXY (VIT D DEFICIENCY, FRACTURES): Vit D, 25-Hydroxy: 15.3 ng/mL — ABNORMAL LOW (ref 30.0–100.0)

## 2022-06-20 ENCOUNTER — Other Ambulatory Visit: Payer: Self-pay | Admitting: Family

## 2022-06-20 MED ORDER — VITAMIN D (ERGOCALCIFEROL) 1.25 MG (50000 UNIT) PO CAPS: 50000.0000 [IU] | ORAL_CAPSULE | ORAL | 3 refills | Status: DC

## 2022-06-27 ENCOUNTER — Telehealth: Payer: Self-pay | Admitting: Family

## 2022-06-27 NOTE — Telephone Encounter (Signed)
Have him call other pharmacies first and see if it is in stock and other pharmacies because changing types of medication does not always work as well.

## 2022-06-27 NOTE — Telephone Encounter (Signed)
PATIENT REPORTS HE HAS CALLED AROUND ALREADY AND CANNOT FIND IT

## 2022-06-28 ENCOUNTER — Other Ambulatory Visit: Payer: Self-pay | Admitting: Family Medicine

## 2022-06-28 DIAGNOSIS — G8929 Other chronic pain: Secondary | ICD-10-CM

## 2022-06-28 MED ORDER — HYDROCODONE-ACETAMINOPHEN 10-325 MG PO TABS: 1.0000 | ORAL_TABLET | Freq: Every day | ORAL | 0 refills | Status: DC | PRN

## 2022-06-28 NOTE — Progress Notes (Signed)
I sent 1 prescription of the hydrocodone for 1 month, will likely not be the same but he can see as far as potency.  Sent it to [facility]

## 2022-06-28 NOTE — Progress Notes (Signed)
Pt informed and has already picked up the Rx. He has no concerns.

## 2022-07-10 ENCOUNTER — Encounter: Payer: Self-pay | Admitting: Nurse Practitioner

## 2022-07-10 ENCOUNTER — Telehealth (INDEPENDENT_AMBULATORY_CARE_PROVIDER_SITE_OTHER): Payer: BC Managed Care – PPO | Admitting: Nurse Practitioner

## 2022-07-10 DIAGNOSIS — J209 Acute bronchitis, unspecified: Secondary | ICD-10-CM | POA: Diagnosis not present

## 2022-07-10 DIAGNOSIS — J440 Chronic obstructive pulmonary disease with acute lower respiratory infection: Secondary | ICD-10-CM | POA: Diagnosis not present

## 2022-07-10 MED ORDER — PROMETHAZINE-DM 6.25-15 MG/5ML PO SYRP: 5.0000 mL | ORAL_SOLUTION | Freq: Four times a day (QID) | ORAL | 0 refills | Status: DC | PRN

## 2022-07-10 MED ORDER — PREDNISONE 20 MG PO TABS: 40.0000 mg | ORAL_TABLET | Freq: Every day | ORAL | 0 refills | Status: AC

## 2022-07-10 NOTE — Progress Notes (Signed)
Virtual Visit Consent   William Carson, you are scheduled for a virtual visit with [name], [place], a [facility] provider, today.     Just as with appointments in the office, your consent must be obtained to participate.  Your consent will be active for this visit and any virtual visit you may have with one of our providers in the next 365 days.     If you have a MyChart account, a copy of this consent can be sent to you electronically.  All virtual visits are billed to your insurance company just like a traditional visit in the office.    As this is a virtual visit, video technology does not allow for your provider to perform a traditional examination.  This may limit your provider's ability to fully assess your condition.  If your provider identifies any concerns that need to be evaluated in person or the need to arrange testing (such as labs, EKG, etc.), we will make arrangements to do so.     Although advances in technology are sophisticated, we cannot ensure that it will always work on either your end or our end.  If the connection with a video visit is poor, the visit may have to be switched to a telephone visit.  With either a video or telephone visit, we are not always able to ensure that we have a secure connection.     I need to obtain your verbal consent now.   Are you willing to proceed with your visit today? YES   IMER FOXWORTH has provided verbal consent on 07/10/2022 for a virtual visit (video or telephone).   [name], FNP   Date: 07/10/2022 10:13 AM   Virtual Visit via Video Note   I, [name], connected with William Carson ([id number], 1966-01-29) on 07/10/22 at 11:15 AM EST by a video-enabled telemedicine application and verified that I am speaking with the correct person using two identifiers.  Location: Patient: Virtual Visit Location Patient: Home Provider: Virtual Visit Location Provider: Mobile   I discussed the  limitations of evaluation and management by telemedicine and the availability of in person appointments. The patient expressed understanding and agreed to proceed.    History of Present Illness: William Carson is a 56 y.o. who identifies as a male who was assigned male at birth, and is being seen today for cough.  HPI: Cough This is a new problem. The current episode started yesterday. The problem has been gradually worsening. The problem occurs every few minutes. The cough is Productive of sputum. Associated symptoms include rhinorrhea. Pertinent negatives include no chills, ear congestion, fever, sore throat or shortness of breath. Nothing aggravates the symptoms. He has tried nothing for the symptoms. The treatment provided mild relief.    Review of Systems  Constitutional:  Negative for chills and fever.  HENT:  Positive for rhinorrhea. Negative for sore throat.   Respiratory:  Positive for cough. Negative for shortness of breath.     Problems:  Patient Active Problem List   Diagnosis Date Noted   Candida rash of groin 05/01/2022   Statin intolerance 06/09/2020   Essential hypertension 01/19/2020   Common migraine with intractable migraine 01/20/2019   Perinephric hematoma 06/23/2018   Headache syndrome 05/08/2017   History of diverticulitis 12/24/2016   History of kidney stones 12/24/2016   T2DM (type 2 diabetes mellitus) ([place]) 05/13/2016   Allergy to meat 05/07/2016   Chronic left-sided low back pain with left-sided sciatica  05/07/2016   Obesity (BMI 30-39.9) 08/04/2015   Vitamin D deficiency 04/12/2015   Hyperlipidemia associated with type 2 diabetes mellitus ([place]) 07/18/2010   CAD S/P percutaneous coronary angioplasty 07/18/2010   COPD (chronic obstructive pulmonary disease) ([place]) 07/18/2010    Allergies:  Allergies  Allergen Reactions   Crestor [Rosuvastatin Calcium] Other (See Comments)    myalgias   Depakote [Divalproex Sodium]     Stomach pains   Lipitor  [Atorvastatin] Other (See Comments)    Myalgias    Meclizine     Increased dizziness   Other Swelling    Mammelain meat allergy   Simvastatin Other (See Comments)    Myalgias    Statins Other (See Comments)    Myalgia    Topamax [Topiramate]     Back pain, tinnitus   Zonegran [Zonisamide]     Increased headache   Medications:  Current Outpatient Medications:    albuterol (PROVENTIL) (2.5 MG/3ML) 0.083% nebulizer solution, Take 3 mLs (2.5 mg total) by nebulization every 4 (four) hours as needed for wheezing or shortness of breath., Disp: 150 mL, Rfl: 1   albuterol (VENTOLIN HFA) 108 (90 Base) MCG/ACT inhaler, Inhale 2 puffs into the lungs every 6 (six) hours as needed for wheezing or shortness of breath., Disp: 8 g, Rfl: 2   amLODipine (NORVASC) 5 MG tablet, TAKE ONE TABLET ONCE DAILY, Disp: 90 tablet, Rfl: 1   aspirin EC 81 MG tablet, Take 1 tablet (81 mg total) by mouth daily. Swallow whole., Disp: 90 tablet, Rfl: 3   cetirizine (ZYRTEC) 10 MG tablet, Take by mouth., Disp: , Rfl:    clobetasol cream (TEMOVATE) 2.54 %, Apply 1 Application topically 2 (two) times daily., Disp: 60 g, Rfl: 1   Dulaglutide (TRULICITY) 2.70 WC/3.7SE SOPN, Inject 0.75 mg into the skin once a week., Disp: 28 mL, Rfl: 3   ezetimibe (ZETIA) 10 MG tablet, Take 1 tablet (10 mg total) by mouth daily., Disp: 90 tablet, Rfl: 3   glipiZIDE (GLUCOTROL) 10 MG tablet, Take 1 tablet (10 mg total) by mouth 2 (two) times daily before a meal., Disp: 14 tablet, Rfl: 1   glucose blood (CONTOUR NEXT TEST) test strip, Test BS up to 5 times daily Dx E11.69, Disp: 500 strip, Rfl: 3   HYDROcodone-acetaminophen (NORCO) 10-325 MG tablet, Take 1 tablet by mouth daily as needed., Disp: 30 tablet, Rfl: 0   ketoconazole (NIZORAL) 2 % cream, Apply 1 Application topically daily., Disp: 30 g, Rfl: 1   lisinopril (ZESTRIL) 10 MG tablet, Take 1 tablet (10 mg total) by mouth daily., Disp: 90 tablet, Rfl: 3   metFORMIN (GLUCOPHAGE-XR) 500 MG  24 hr tablet, Take 1 tablet (500 mg total) by mouth daily with breakfast., Disp: 90 tablet, Rfl: 2   nitroGLYCERIN (NITROSTAT) 0.4 MG SL tablet, Place 1 tablet (0.4 mg total) under the tongue every 5 (five) minutes as needed for chest pain. May repeat up to 3 doses., Disp: 25 tablet, Rfl: 3   nystatin (MYCOSTATIN/NYSTOP) powder, Apply 1 Application topically 3 (three) times daily., Disp: 15 g, Rfl: 0   Oxycodone HCl 10 MG TABS, Take 1 tablet (10 mg total) by mouth in the morning., Disp: 30 tablet, Rfl: 0   Oxycodone HCl 10 MG TABS, Take 1 tablet (10 mg total) by mouth daily as needed., Disp: 30 tablet, Rfl: 0   [START ON 07/22/2022] Oxycodone HCl 10 MG TABS, Take 1 tablet (10 mg total) by mouth daily as needed., Disp: 30 tablet, Rfl: 0  SYMBICORT 160-4.5 MCG/ACT inhaler, INHALE 2 PUFFS INTO THE LUNGS 2 TIMES A DAY, Disp: 10.2 g, Rfl: 2   TRUEplus Lancets 33G MISC, Test BS up to 5 times daily Dx E11.69, Disp: 500 each, Rfl: 3   Vitamin D, Ergocalciferol, (DRISDOL) 1.25 MG (50000 UNIT) CAPS capsule, Take 1 capsule (50,000 Units total) by mouth every 7 (seven) days., Disp: 12 capsule, Rfl: 3  Observations/Objective: Patient is well-developed, well-nourished in no acute distress.  Resting comfortably  at home.  Head is normocephalic, atraumatic.  No labored breathing.  Speech is clear and coherent with logical content.  Patient is alert and oriented at baseline.  Raspy voice Exp wheezes audible  Assessment and Plan:  [name] in today with chief complaint of Cough   1. Acute bronchitis with COPD ([place]) 1. Take meds as prescribed 2. Use a cool mist humidifier especially during the winter months and when heat has been humid. 3. Use saline nose sprays frequently 4. Saline irrigations of the nose can be very helpful if done frequently.  * 4X daily for 1 week*  * Use of a nettie pot can be helpful with this. Follow directions with this* 5. Drink plenty of fluids 6. Keep thermostat turn  down low 7.For any cough or congestion- promethazine DM 8. For fever or aces or pains- take tylenol or ibuprofen appropriate for age and weight.  * for fevers greater than 101 orally you may alternate ibuprofen and tylenol every  3 hours.   Meds ordered this encounter  Medications   predniSONE (DELTASONE) 20 MG tablet    Sig: Take 2 tablets (40 mg total) by mouth daily with breakfast for 5 days. 2 po daily for 5 days    Dispense:  10 tablet    Refill:  0    Order Specific Question:   Supervising Provider    Answer:   Caryl Pina A [[id number]]   promethazine-dextromethorphan (PROMETHAZINE-DM) 6.25-15 MG/5ML syrup    Sig: Take 5 mLs by mouth 4 (four) times daily as needed for cough.    Dispense:  118 mL    Refill:  0    Order Specific Question:   Supervising Provider    Answer:   Caryl Pina A [[id number]]        Follow Up Instructions: I discussed the assessment and treatment plan with the patient. The patient was provided an opportunity to ask questions and all were answered. The patient agreed with the plan and demonstrated an understanding of the instructions.  A copy of instructions were sent to the patient via MyChart.  The patient was advised to call back or seek an in-person evaluation if the symptoms worsen or if the condition fails to improve as anticipated.  Time:  I spent 7 minutes with the patient via telehealth technology discussing the above problems/concerns.    [name], FNP

## 2022-07-10 NOTE — Patient Instructions (Signed)
1. Take meds as prescribed 2. Use a cool mist humidifier especially during the winter months and when heat has been humid. 3. Use saline nose sprays frequently 4. Saline irrigations of the nose can be very helpful if done frequently.  * 4X daily for 1 week*  * Use of a nettie pot can be helpful with this. Follow directions with this* 5. Drink plenty of fluids 6. Keep thermostat turn down low 7.For any cough or congestion- promethazine dm 8. For fever or aces or pains- take tylenol or ibuprofen appropriate for age and weight.  * for fevers greater than 101 orally you may alternate ibuprofen and tylenol every  3 hours.    

## 2022-07-16 ENCOUNTER — Telehealth (INDEPENDENT_AMBULATORY_CARE_PROVIDER_SITE_OTHER): Payer: BC Managed Care – PPO | Admitting: Family

## 2022-07-16 ENCOUNTER — Encounter: Payer: Self-pay | Admitting: Family

## 2022-07-16 DIAGNOSIS — J441 Chronic obstructive pulmonary disease with (acute) exacerbation: Secondary | ICD-10-CM

## 2022-07-16 MED ORDER — DOXYCYCLINE HYCLATE 100 MG PO TABS: 100.0000 mg | ORAL_TABLET | Freq: Two times a day (BID) | ORAL | 0 refills | Status: DC

## 2022-07-16 MED ORDER — PREDNISONE 10 MG (21) PO TBPK: ORAL_TABLET | ORAL | 0 refills | Status: DC

## 2022-07-16 NOTE — Progress Notes (Signed)
Virtual Visit Consent   William Carson, you are scheduled for a virtual visit with a [facility] provider today. Just as with appointments in the office, your consent must be obtained to participate. Your consent will be active for this visit and any virtual visit you may have with one of our providers in the next 365 days. If you have a MyChart account, a copy of this consent can be sent to you electronically.  As this is a virtual visit, video technology does not allow for your provider to perform a traditional examination. This may limit your provider's ability to fully assess your condition. If your provider identifies any concerns that need to be evaluated in person or the need to arrange testing (such as labs, EKG, etc.), we will make arrangements to do so. Although advances in technology are sophisticated, we cannot ensure that it will always work on either your end or our end. If the connection with a video visit is poor, the visit may have to be switched to a telephone visit. With either a video or telephone visit, we are not always able to ensure that we have a secure connection.  By engaging in this virtual visit, you consent to the provision of healthcare and authorize for your insurance to be billed (if applicable) for the services provided during this visit. Depending on your insurance coverage, you may receive a charge related to this service.  I need to obtain your verbal consent now. Are you willing to proceed with your visit today? William Carson has provided verbal consent on 07/16/2022 for a virtual visit (video or telephone). [name], FNP  Date: 07/16/2022 12:18 PM  Virtual Visit via Video Note   I, [name], connected with  William Carson  ([id number], November 14, 1965) on 07/16/22 at  6:00 PM EST by a video-enabled telemedicine application and verified that I am speaking with the correct person using two identifiers.  Location: Patient: Virtual Visit Location Patient:  Home Provider: Virtual Visit Location Provider: Office/Clinic   I discussed the limitations of evaluation and management by telemedicine and the availability of in person appointments. The patient expressed understanding and agreed to proceed.    History of Present Illness: William Carson is a 56 y.o. who identifies as a male who was assigned male at birth, and is being seen today for cough that started last week. He was given prednisone and cough syrup with mild relief.   HPI: Cough This is a new problem. The current episode started 1 to 4 weeks ago. The problem has been gradually worsening. The problem occurs every few minutes. The cough is Productive of sputum. Associated symptoms include chills, a fever, headaches, nasal congestion, postnasal drip, shortness of breath and wheezing. Pertinent negatives include no ear congestion, ear pain or myalgias. Risk factors for lung disease include smoking/tobacco exposure. He has tried rest and OTC cough suppressant for the symptoms. The treatment provided mild relief.    Problems:  Patient Active Problem List   Diagnosis Date Noted   Candida rash of groin 05/01/2022   Statin intolerance 06/09/2020   Essential hypertension 01/19/2020   Common migraine with intractable migraine 01/20/2019   Perinephric hematoma 06/23/2018   Headache syndrome 05/08/2017   History of diverticulitis 12/24/2016   History of kidney stones 12/24/2016   T2DM (type 2 diabetes mellitus) ([place]) 05/13/2016   Allergy to meat 05/07/2016   Chronic left-sided low back pain with left-sided sciatica 05/07/2016   Obesity (BMI 30-39.9) 08/04/2015  Vitamin D deficiency 04/12/2015   Hyperlipidemia associated with type 2 diabetes mellitus ([place]) 07/18/2010   CAD S/P percutaneous coronary angioplasty 07/18/2010   COPD (chronic obstructive pulmonary disease) ([place]) 07/18/2010    Allergies:  Allergies  Allergen Reactions   Crestor [Rosuvastatin Calcium] Other (See Comments)     myalgias   Depakote [Divalproex Sodium]     Stomach pains   Lipitor [Atorvastatin] Other (See Comments)    Myalgias    Meclizine     Increased dizziness   Other Swelling    Mammelain meat allergy   Simvastatin Other (See Comments)    Myalgias    Statins Other (See Comments)    Myalgia    Topamax [Topiramate]     Back pain, tinnitus   Zonegran [Zonisamide]     Increased headache   Medications:  Current Outpatient Medications:    doxycycline (VIBRA-TABS) 100 MG tablet, Take 1 tablet (100 mg total) by mouth 2 (two) times daily., Disp: 20 tablet, Rfl: 0   predniSONE (STERAPRED UNI-PAK 21 TAB) 10 MG (21) TBPK tablet, Use as directed, Disp: 21 tablet, Rfl: 0   albuterol (PROVENTIL) (2.5 MG/3ML) 0.083% nebulizer solution, Take 3 mLs (2.5 mg total) by nebulization every 4 (four) hours as needed for wheezing or shortness of breath., Disp: 150 mL, Rfl: 1   albuterol (VENTOLIN HFA) 108 (90 Base) MCG/ACT inhaler, Inhale 2 puffs into the lungs every 6 (six) hours as needed for wheezing or shortness of breath., Disp: 8 g, Rfl: 2   amLODipine (NORVASC) 5 MG tablet, TAKE ONE TABLET ONCE DAILY, Disp: 90 tablet, Rfl: 1   aspirin EC 81 MG tablet, Take 1 tablet (81 mg total) by mouth daily. Swallow whole., Disp: 90 tablet, Rfl: 3   cetirizine (ZYRTEC) 10 MG tablet, Take by mouth., Disp: , Rfl:    clobetasol cream (TEMOVATE) 8.67 %, Apply 1 Application topically 2 (two) times daily., Disp: 60 g, Rfl: 1   Dulaglutide (TRULICITY) 6.72 CN/4.7SJ SOPN, Inject 0.75 mg into the skin once a week., Disp: 28 mL, Rfl: 3   ezetimibe (ZETIA) 10 MG tablet, Take 1 tablet (10 mg total) by mouth daily., Disp: 90 tablet, Rfl: 3   glipiZIDE (GLUCOTROL) 10 MG tablet, Take 1 tablet (10 mg total) by mouth 2 (two) times daily before a meal., Disp: 14 tablet, Rfl: 1   glucose blood (CONTOUR NEXT TEST) test strip, Test BS up to 5 times daily Dx E11.69, Disp: 500 strip, Rfl: 3   HYDROcodone-acetaminophen (NORCO) 10-325 MG  tablet, Take 1 tablet by mouth daily as needed., Disp: 30 tablet, Rfl: 0   ketoconazole (NIZORAL) 2 % cream, Apply 1 Application topically daily., Disp: 30 g, Rfl: 1   lisinopril (ZESTRIL) 10 MG tablet, Take 1 tablet (10 mg total) by mouth daily., Disp: 90 tablet, Rfl: 3   metFORMIN (GLUCOPHAGE-XR) 500 MG 24 hr tablet, Take 1 tablet (500 mg total) by mouth daily with breakfast., Disp: 90 tablet, Rfl: 2   nitroGLYCERIN (NITROSTAT) 0.4 MG SL tablet, Place 1 tablet (0.4 mg total) under the tongue every 5 (five) minutes as needed for chest pain. May repeat up to 3 doses., Disp: 25 tablet, Rfl: 3   nystatin (MYCOSTATIN/NYSTOP) powder, Apply 1 Application topically 3 (three) times daily., Disp: 15 g, Rfl: 0   Oxycodone HCl 10 MG TABS, Take 1 tablet (10 mg total) by mouth in the morning., Disp: 30 tablet, Rfl: 0   Oxycodone HCl 10 MG TABS, Take 1 tablet (10 mg total) by  mouth daily as needed., Disp: 30 tablet, Rfl: 0   [START ON 07/22/2022] Oxycodone HCl 10 MG TABS, Take 1 tablet (10 mg total) by mouth daily as needed., Disp: 30 tablet, Rfl: 0   promethazine-dextromethorphan (PROMETHAZINE-DM) 6.25-15 MG/5ML syrup, Take 5 mLs by mouth 4 (four) times daily as needed for cough., Disp: 118 mL, Rfl: 0   SYMBICORT 160-4.5 MCG/ACT inhaler, INHALE 2 PUFFS INTO THE LUNGS 2 TIMES A DAY, Disp: 10.2 g, Rfl: 2   TRUEplus Lancets 33G MISC, Test BS up to 5 times daily Dx E11.69, Disp: 500 each, Rfl: 3   Vitamin D, Ergocalciferol, (DRISDOL) 1.25 MG (50000 UNIT) CAPS capsule, Take 1 capsule (50,000 Units total) by mouth every 7 (seven) days., Disp: 12 capsule, Rfl: 3  Observations/Objective: Patient is well-developed, well-nourished in no acute distress.  Resting comfortably  at home.  Head is normocephalic, atraumatic.  No labored breathing.  Speech is clear and coherent with logical content.  Patient is alert and oriented at baseline.  Coarse cough  Assessment and Plan: 1. COPD exacerbation ([facility]) - doxycycline  (VIBRA-TABS) 100 MG tablet; Take 1 tablet (100 mg total) by mouth 2 (two) times daily.  Dispense: 20 tablet; Refill: 0 - predniSONE (STERAPRED UNI-PAK 21 TAB) 10 MG (21) TBPK tablet; Use as directed  Dispense: 21 tablet; Refill: 0  -start doxycycline now - Take meds as prescribed - Use a cool mist humidifier  -Use saline nose sprays frequently -Force fluids -For any cough or congestion  Use plain Mucinex- regular strength or max strength is fine -For fever or aces or pains- take tylenol or ibuprofen. -Throat lozenges if help -Follow up if symptoms worsen or do not improve   Follow Up Instructions: I discussed the assessment and treatment plan with the patient. The patient was provided an opportunity to ask questions and all were answered. The patient agreed with the plan and demonstrated an understanding of the instructions.  A copy of instructions were sent to the patient via MyChart unless otherwise noted below.     The patient was advised to call back or seek an in-person evaluation if the symptoms worsen or if the condition fails to improve as anticipated.  Time:  I spent 9 minutes with the patient via telehealth technology discussing the above problems/concerns.    [name], FNP

## 2022-07-16 NOTE — Patient Instructions (Signed)
Chronic Obstructive Pulmonary Disease  Chronic obstructive pulmonary disease (COPD) is a long-term (chronic) condition that affects the lungs. COPD is a general term that can be used to describe many different lung problems that cause lung inflammation and limit airflow, including chronic bronchitis and emphysema. If you have COPD, your lung function will probably never return to normal. In most cases, it gets worse over time. However, there are steps you can take to slow the progression of the disease and improve your quality of life. What are the causes? This condition may be caused by: Smoking. This is the most common cause. Certain genes passed down through families. What increases the risk? The following factors may make you more likely to develop this condition: Being exposed to secondhand smoke from cigarettes, pipes, or cigars. Being exposed to chemicals and other irritants, such as fumes and dust in the work environment. Having chronic lung conditions or infections. What are the signs or symptoms? Symptoms of this condition include: Shortness of breath, especially during physical activity. Chronic cough with a large amount of thick mucus. Sometimes, the cough may not have any mucus (dry cough). Wheezing and rapid breathing. Gray or bluish discoloration (cyanosis) of the skin, especially in the fingers, toes, or lips. Feeling tired (fatigue). Weight loss. Chest tightness. Frequent infections. Episodes when breathing symptoms become much worse (exacerbations). At the later stages of this disease, you may have swelling in the ankles, feet, or legs. How is this diagnosed? This condition is diagnosed based on: Your medical history. A physical exam. You may also have tests, including: Lung (pulmonary) function tests. This may include a spirometry test, which measures your ability to exhale properly. Chest X-ray. CT scan. Blood tests. How is this treated? This condition may be  treated with: Medicines. These may include inhaled rescue medicines to treat acute exacerbations as well as medicines that you take long-term (maintenance medicines) to prevent flare-ups of COPD. Bronchodilators help treat COPD by dilating the airways to allow increased airflow and make your breathing more comfortable. Steroids can reduce airway inflammation and help prevent exacerbations. Smoking cessation. If you smoke, your health care provider may ask you to quit, and may also recommend therapy or replacement products to help you quit. Pulmonary rehabilitation. This may involve working with a team of health care providers and specialists, such as respiratory, occupational, and physical therapists. Exercise and physical activity. These are beneficial for nearly all people with COPD. Nutrition therapy to gain weight, if you are underweight. Oxygen. Supplemental oxygen therapy is only helpful if you have a low oxygen level in your blood (hypoxemia). Lung surgery or transplant. Palliative care. This is to help people with COPD feel comfortable when treatment is no longer working. Follow these instructions at home: Medicines Take over-the-counter and prescription medicines only as told by your health care provider. This includes inhaled medicines and pills. Talk to your health care provider before taking any cough or allergy medicines. You may need to avoid certain medicines that dry out your airways. Lifestyle If you smoke, the most important thing that you can do is to stop smoking. Continuing to smoke will cause the disease to progress faster. Do not use any products that contain nicotine or tobacco. These products include cigarettes, chewing tobacco, and vaping devices, such as e-cigarettes. If you need help quitting, ask your health care provider. Avoid exposure to things that irritate your lungs, such as smoke, chemicals, and fumes. Stay active, but balance activity with periods of rest.  Exercise and   physical activity will help you maintain your ability to do things you want to do. Learn and use relaxation techniques to manage stress and to control your breathing. Get the right amount of sleep and get quality sleep. Most adults need 7 or more hours per night. Eat healthy foods. Eating smaller, more frequent meals and resting before meals may help you maintain your strength. Controlled breathing Learn and use controlled breathing techniques as directed by your health care provider. Controlled breathing techniques include: Pursed lip breathing. Start by breathing in (inhaling) through your nose for 1 second. Then, purse your lips as if you were going to whistle and breathe out (exhale) through the pursed lips for 2 seconds. Diaphragmatic breathing. Start by putting one hand on your abdomen just above your waist. Inhale slowly through your nose. The hand on your abdomen should move out. Then purse your lips and exhale slowly. You should be able to feel the hand on your abdomen moving in as you exhale.  Controlled coughing Learn and use controlled coughing to clear mucus from your lungs. Controlled coughing is a series of short, progressive coughs. The steps of controlled coughing are: Lean your head slightly forward. Breathe in deeply using diaphragmatic breathing. Try to hold your breath for 3 seconds. Keep your mouth slightly open while coughing twice. Spit any mucus out into a tissue. Rest and repeat the steps once or twice as needed. General instructions Make sure you receive all the vaccines that your health care provider recommends, especially the pneumococcal and influenza vaccines. Preventing infection and hospitalization is very important when you have COPD. Drink enough fluid to keep your urine pale yellow, unless you have a medical condition that requires fluid restriction. Use oxygen therapy and pulmonary rehabilitation if told by your health care provider. If you  require home oxygen therapy, ask your health care provider whether you should purchase a pulse oximeter to measure your oxygen level at home. Work with your health care provider to develop a COPD action plan. This will help you know what steps to take if your condition gets worse. Keep other chronic health conditions under control as told by your health care provider. Avoid extreme temperature and humidity changes. Avoid contact with people who have an illness that spreads from person to person (is contagious), such as viral infections or pneumonia. Keep all follow-up visits. This is important. Contact a health care provider if: You are coughing up more mucus than usual. There is a change in the color or thickness of your mucus. Your breathing is more labored than usual. Your breathing is faster than usual. You have difficulty sleeping. You need to use your rescue medicines or inhalers more often than expected. You have trouble doing routine activities such as getting dressed or walking around the house. Get help right away if: You have shortness of breath while you are resting. You have shortness of breath that prevents you from: Being able to talk. Performing your usual physical activities. You have chest pain lasting longer than 5 minutes. Your skin color is more blue (cyanotic) than usual. You measure low oxygen saturations for longer than 5 minutes with a pulse oximeter. You have a fever. You feel too tired to breathe normally. These symptoms may represent a serious problem that is an emergency. Do not wait to see if the symptoms will go away. Get medical help right away. Call your local emergency services (911 in the [place] Do not drive yourself to the hospital. Summary Chronic obstructive   pulmonary disease (COPD) is a long-term (chronic) condition that affects the lungs. Your lung function will probably never return to normal. In most cases, it gets worse over time. However, there  are steps you can take to slow the progression of the disease and improve your quality of life. Treatment for COPD may include taking medicines, quitting smoking, pulmonary rehabilitation, and changes to diet and exercise. As the disease progresses, you may need oxygen therapy, a lung transplant, or palliative care. To help manage your condition, do not smoke, avoid exposure to things that irritate your lungs, stay up to date on all vaccines, and follow your health care provider's instructions for taking medicines. This information is not intended to replace advice given to you by your health care provider. Make sure you discuss any questions you have with your health care provider. Document Revised: 05/16/2020 Document Reviewed: 05/16/2020 Elsevier Patient Education  2023 [employer] Inc.  

## 2022-08-06 ENCOUNTER — Other Ambulatory Visit: Payer: Self-pay | Admitting: Family

## 2022-08-06 DIAGNOSIS — J439 Emphysema, unspecified: Secondary | ICD-10-CM

## 2022-08-27 ENCOUNTER — Encounter: Payer: Self-pay | Admitting: Family

## 2022-08-27 ENCOUNTER — Ambulatory Visit (INDEPENDENT_AMBULATORY_CARE_PROVIDER_SITE_OTHER): Payer: BC Managed Care – PPO | Admitting: Family

## 2022-08-27 VITALS — BP 117/78 | HR 75 | Temp 97.6°F | Ht 74.0 in | Wt 243.6 lb

## 2022-08-27 DIAGNOSIS — I10 Essential (primary) hypertension: Secondary | ICD-10-CM

## 2022-08-27 DIAGNOSIS — F1129 Opioid dependence with unspecified opioid-induced disorder: Secondary | ICD-10-CM | POA: Diagnosis not present

## 2022-08-27 DIAGNOSIS — J449 Chronic obstructive pulmonary disease, unspecified: Secondary | ICD-10-CM | POA: Diagnosis not present

## 2022-08-27 DIAGNOSIS — G720 Drug-induced myopathy: Secondary | ICD-10-CM

## 2022-08-27 DIAGNOSIS — E119 Type 2 diabetes mellitus without complications: Secondary | ICD-10-CM

## 2022-08-27 DIAGNOSIS — G8929 Other chronic pain: Secondary | ICD-10-CM

## 2022-08-27 DIAGNOSIS — T466X5A Adverse effect of antihyperlipidemic and antiarteriosclerotic drugs, initial encounter: Secondary | ICD-10-CM

## 2022-08-27 DIAGNOSIS — E669 Obesity, unspecified: Secondary | ICD-10-CM

## 2022-08-27 DIAGNOSIS — E785 Hyperlipidemia, unspecified: Secondary | ICD-10-CM

## 2022-08-27 DIAGNOSIS — M5442 Lumbago with sciatica, left side: Secondary | ICD-10-CM

## 2022-08-27 DIAGNOSIS — Z79899 Other long term (current) drug therapy: Secondary | ICD-10-CM

## 2022-08-27 DIAGNOSIS — F5101 Primary insomnia: Secondary | ICD-10-CM

## 2022-08-27 DIAGNOSIS — I2510 Atherosclerotic heart disease of native coronary artery without angina pectoris: Secondary | ICD-10-CM

## 2022-08-27 DIAGNOSIS — E1169 Type 2 diabetes mellitus with other specified complication: Secondary | ICD-10-CM

## 2022-08-27 DIAGNOSIS — Z9861 Coronary angioplasty status: Secondary | ICD-10-CM

## 2022-08-27 DIAGNOSIS — J438 Other emphysema: Secondary | ICD-10-CM

## 2022-08-27 DIAGNOSIS — E559 Vitamin D deficiency, unspecified: Secondary | ICD-10-CM

## 2022-08-27 DIAGNOSIS — Z789 Other specified health status: Secondary | ICD-10-CM

## 2022-08-27 MED ORDER — AMLODIPINE BESYLATE 5 MG PO TABS: 5.0000 mg | ORAL_TABLET | Freq: Every day | ORAL | 1 refills | Status: DC

## 2022-08-27 MED ORDER — OXYCODONE HCL 10 MG PO TABS: 10.0000 mg | ORAL_TABLET | Freq: Every morning | ORAL | 0 refills | Status: DC

## 2022-08-27 MED ORDER — LISINOPRIL 10 MG PO TABS: 10.0000 mg | ORAL_TABLET | Freq: Every day | ORAL | 3 refills | Status: DC

## 2022-08-27 MED ORDER — METFORMIN HCL ER 500 MG PO TB24: 500.0000 mg | ORAL_TABLET | Freq: Every day | ORAL | 2 refills | Status: DC

## 2022-08-27 MED ORDER — OXYCODONE HCL 10 MG PO TABS: 10.0000 mg | ORAL_TABLET | Freq: Every day | ORAL | 0 refills | Status: DC | PRN

## 2022-08-27 MED ORDER — TRAZODONE HCL 100 MG PO TABS: 100.0000 mg | ORAL_TABLET | Freq: Every day | ORAL | 1 refills | Status: DC

## 2022-08-27 MED ORDER — GLIPIZIDE 10 MG PO TABS: 10.0000 mg | ORAL_TABLET | Freq: Two times a day (BID) | ORAL | 1 refills | Status: DC

## 2022-08-27 NOTE — Progress Notes (Signed)
Subjective:    Patient ID: William Carson, male    DOB: [birth year], 57 y.o.   MRN: [id number]  Chief Complaint  Patient presents with   Medical Management of Chronic Issues   PT presents to the office today for chronic follow up. PT was followed by Cardiologists annually for CAD. States he has not seen them since COVID.    He has COPD and states his breathing is stable. He states he quit smoking 08/24/22. He reports his breathing is doing slightly better. He reports he taking Symbicort  BID. States his breathing is worse in the hot weather.   He can not tolerate statins because myalgia.  Diabetes He presents for his follow-up diabetic visit. He has type 2 diabetes mellitus. Pertinent negatives for diabetes include no blurred vision and no foot paresthesias. Symptoms are stable. Risk factors for coronary artery disease include dyslipidemia, diabetes mellitus, hypertension, male sex and sedentary lifestyle. He is following a generally healthy diet. His overall blood glucose range is 110-130 mg/dl.  Hypertension This is a chronic problem. The current episode started more than 1 year ago. The problem has been resolved since onset. The problem is controlled. Associated symptoms include malaise/fatigue and shortness of breath. Pertinent negatives include no blurred vision or peripheral edema. Risk factors for coronary artery disease include diabetes mellitus, dyslipidemia, obesity, male gender, smoking/tobacco exposure and sedentary lifestyle. The current treatment provides moderate improvement.  Hyperlipidemia This is a chronic problem. The current episode started more than 1 year ago. The problem is uncontrolled. Exacerbating diseases include obesity. Associated symptoms include shortness of breath. Current antihyperlipidemic treatment includes statins. The current treatment provides moderate improvement of lipids. Risk factors for coronary artery disease include diabetes mellitus, dyslipidemia,  hypertension, a sedentary lifestyle and post-menopausal.  Back Pain This is a chronic problem. The current episode started more than 1 year ago. The problem occurs intermittently. The problem has been waxing and waning since onset. The pain is present in the lumbar spine. The quality of the pain is described as aching. The pain is at a severity of 5/10. The pain is moderate. Risk factors include obesity. He has tried analgesics for the symptoms. The treatment provided mild relief.  Insomnia Primary symptoms: difficulty falling asleep, frequent awakening, malaise/fatigue.   The current episode started more than one year. The onset quality is gradual. The problem occurs intermittently.      Review of Systems  Constitutional:  Positive for malaise/fatigue.  Eyes:  Negative for blurred vision.  Respiratory:  Positive for shortness of breath.   Musculoskeletal:  Positive for back pain.  Psychiatric/Behavioral:  The patient has insomnia.   All other systems reviewed and are negative.      Objective:   Physical Exam Vitals reviewed.  Constitutional:      General: He is not in acute distress.    Appearance: He is well-developed. He is obese.  HENT:     Head: Normocephalic.     Right Ear: Tympanic membrane normal.     Left Ear: Tympanic membrane normal.  Eyes:     General:        Right eye: No discharge.        Left eye: No discharge.     Pupils: Pupils are equal, round, and reactive to light.  Neck:     Thyroid: No thyromegaly.  Cardiovascular:     Rate and Rhythm: Normal rate and regular rhythm.     Heart sounds: Normal heart sounds. No murmur heard.  Pulmonary:     Effort: Pulmonary effort is normal. No respiratory distress.     Breath sounds: Rhonchi present. No wheezing.  Abdominal:     General: Bowel sounds are normal. There is no distension.     Palpations: Abdomen is soft.     Tenderness: There is no abdominal tenderness.  Musculoskeletal:        General: No tenderness.  Normal range of motion.     Cervical back: Normal range of motion and neck supple.  Skin:    General: Skin is warm and dry.     Findings: No erythema or rash.  Neurological:     Mental Status: He is alert and oriented to person, place, and time.     Cranial Nerves: No cranial nerve deficit.     Deep Tendon Reflexes: Reflexes are normal and symmetric.  Psychiatric:        Behavior: Behavior normal.        Thought Content: Thought content normal.        Judgment: Judgment normal.       BP 117/78   Pulse 75   Temp 97.6 F (36.4 C) (Temporal)   Ht '6\' 2"'$  (1.88 m)   Wt 243 lb 9.6 oz (110.5 kg)   SpO2 97%   BMI 31.28 kg/m      Assessment & Plan:  William Carson comes in today with chief complaint of Medical Management of Chronic Issues   Diagnosis and orders addressed:  1. Essential hypertension - amLODipine (NORVASC) 5 MG tablet; Take 1 tablet (5 mg total) by mouth daily.  Dispense: 90 tablet; Refill: 1 - lisinopril (ZESTRIL) 10 MG tablet; Take 1 tablet (10 mg total) by mouth daily.  Dispense: 90 tablet; Refill: 3  2. Type 2 diabetes mellitus without complication, without long-term current use of insulin (HCC) - glipiZIDE (GLUCOTROL) 10 MG tablet; Take 1 tablet (10 mg total) by mouth 2 (two) times daily before a meal.  Dispense: 14 tablet; Refill: 1 - lisinopril (ZESTRIL) 10 MG tablet; Take 1 tablet (10 mg total) by mouth daily.  Dispense: 90 tablet; Refill: 3 - metFORMIN (GLUCOPHAGE-XR) 500 MG 24 hr tablet; Take 1 tablet (500 mg total) by mouth daily with breakfast.  Dispense: 90 tablet; Refill: 2  3. Opioid dependence with opioid-induced disorder (HCC)  - Oxycodone HCl 10 MG TABS; Take 1 tablet (10 mg total) by mouth in the morning.  Dispense: 30 tablet; Refill: 0 - Oxycodone HCl 10 MG TABS; Take 1 tablet (10 mg total) by mouth daily as needed.  Dispense: 30 tablet; Refill: 0 - Oxycodone HCl 10 MG TABS; Take 1 tablet (10 mg total) by mouth daily as needed.  Dispense: 30  tablet; Refill: 0  4. Controlled substance agreement signed - Oxycodone HCl 10 MG TABS; Take 1 tablet (10 mg total) by mouth in the morning.  Dispense: 30 tablet; Refill: 0 - Oxycodone HCl 10 MG TABS; Take 1 tablet (10 mg total) by mouth daily as needed.  Dispense: 30 tablet; Refill: 0 - Oxycodone HCl 10 MG TABS; Take 1 tablet (10 mg total) by mouth daily as needed.  Dispense: 30 tablet; Refill: 0  5. Chronic left-sided low back pain with left-sided sciatica - Oxycodone HCl 10 MG TABS; Take 1 tablet (10 mg total) by mouth in the morning.  Dispense: 30 tablet; Refill: 0 - Oxycodone HCl 10 MG TABS; Take 1 tablet (10 mg total) by mouth daily as needed.  Dispense: 30 tablet; Refill: 0 -  Oxycodone HCl 10 MG TABS; Take 1 tablet (10 mg total) by mouth daily as needed.  Dispense: 30 tablet; Refill: 0  6. CAD S/P percutaneous coronary angioplasty  7. Other emphysema ([place])  8. Hyperlipidemia associated with type 2 diabetes mellitus (HCC)  9. Obesity (BMI 30-39.9)   10. Statin intolerance  11. Vitamin D deficiency   12. Statin myopathy  13. Primary insomnia -WIll start Trazodone  Sleep ritual  - traZODone (DESYREL) 100 MG tablet; Take 1 tablet (100 mg total) by mouth at bedtime.  Dispense: 90 tablet; Refill: 1   Labs reviewed from last visit Patient reviewed in [place] controlled database, no flags noted. Contract and drug screen are up to date.  Continue medications  Health Maintenance reviewed Diet and exercise encouraged  Follow up plan:  3 months   [name], FNP

## 2022-08-27 NOTE — Patient Instructions (Signed)
Insomnia Insomnia is a sleep disorder that makes it difficult to fall asleep or stay asleep. Insomnia can cause fatigue, low energy, difficulty concentrating, mood swings, and poor performance at work or school. There are three different ways to classify insomnia: Difficulty falling asleep. Difficulty staying asleep. Waking up too early in the morning. Any type of insomnia can be long-term (chronic) or short-term (acute). Both are common. Short-term insomnia usually lasts for 3 months or less. Chronic insomnia occurs at least three times a week for longer than 3 months. What are the causes? Insomnia may be caused by another condition, situation, or substance, such as: Having certain mental health conditions, such as anxiety and depression. Using caffeine, alcohol, tobacco, or drugs. Having gastrointestinal conditions, such as gastroesophageal reflux disease (GERD). Having certain medical conditions. These include: Asthma. Alzheimer's disease. Stroke. Chronic pain. An overactive thyroid gland (hyperthyroidism). Other sleep disorders, such as restless legs syndrome and sleep apnea. Menopause. Sometimes, the cause of insomnia may not be known. What increases the risk? Risk factors for insomnia include: Gender. Females are affected more often than males. Age. Insomnia is more common as people get older. Stress and certain medical and mental health conditions. Lack of exercise. Having an irregular work schedule. This may include working night shifts and traveling between different time zones. What are the signs or symptoms? If you have insomnia, the main symptom is having trouble falling asleep or having trouble staying asleep. This may lead to other symptoms, such as: Feeling tired or having low energy. Feeling nervous about going to sleep. Not feeling rested in the morning. Having trouble concentrating. Feeling irritable, anxious, or depressed. How is this diagnosed? This condition  may be diagnosed based on: Your symptoms and medical history. Your health care provider may ask about: Your sleep habits. Any medical conditions you have. Your mental health. A physical exam. How is this treated? Treatment for insomnia depends on the cause. Treatment may focus on treating an underlying condition that is causing the insomnia. Treatment may also include: Medicines to help you sleep. Counseling or therapy. Lifestyle adjustments to help you sleep better. Follow these instructions at home: Eating and drinking  Limit or avoid alcohol, caffeinated beverages, and products that contain nicotine and tobacco, especially close to bedtime. These can disrupt your sleep. Do not eat a large meal or eat spicy foods right before bedtime. This can lead to digestive discomfort that can make it hard for you to sleep. Sleep habits  Keep a sleep diary to help you and your health care provider figure out what could be causing your insomnia. Write down: When you sleep. When you wake up during the night. How well you sleep and how rested you feel the next day. Any side effects of medicines you are taking. What you eat and drink. Make your bedroom a dark, comfortable place where it is easy to fall asleep. Put up shades or blackout curtains to block light from outside. Use a white noise machine to block noise. Keep the temperature cool. Limit screen use before bedtime. This includes: Not watching TV. Not using your smartphone, tablet, or computer. Stick to a routine that includes going to bed and waking up at the same times every day and night. This can help you fall asleep faster. Consider making a quiet activity, such as reading, part of your nighttime routine. Try to avoid taking naps during the day so that you sleep better at night. Get out of bed if you are still awake after   15 minutes of trying to sleep. Keep the lights down, but try reading or doing a quiet activity. When you feel  sleepy, go back to bed. General instructions Take over-the-counter and prescription medicines only as told by your health care provider. Exercise regularly as told by your health care provider. However, avoid exercising in the hours right before bedtime. Use relaxation techniques to manage stress. Ask your health care provider to suggest some techniques that may work well for you. These may include: Breathing exercises. Routines to release muscle tension. Visualizing peaceful scenes. Make sure that you drive carefully. Do not drive if you feel very sleepy. Keep all follow-up visits. This is important. Contact a health care provider if: You are tired throughout the day. You have trouble in your daily routine due to sleepiness. You continue to have sleep problems, or your sleep problems get worse. Get help right away if: You have thoughts about hurting yourself or someone else. Get help right away if you feel like you may hurt yourself or others, or have thoughts about taking your own life. Go to your nearest emergency room or: Call 911. Call the National Suicide Prevention Lifeline at 1-800-273-8255 or 988. This is open 24 hours a day. Text the Crisis Text Line at 741741. Summary Insomnia is a sleep disorder that makes it difficult to fall asleep or stay asleep. Insomnia can be long-term (chronic) or short-term (acute). Treatment for insomnia depends on the cause. Treatment may focus on treating an underlying condition that is causing the insomnia. Keep a sleep diary to help you and your health care provider figure out what could be causing your insomnia. This information is not intended to replace advice given to you by your health care provider. Make sure you discuss any questions you have with your health care provider. Document Revised: 06/18/2021 Document Reviewed: 06/18/2021 Elsevier Patient Education  2023 [employer] Inc[employer]  

## 2022-10-17 ENCOUNTER — Telehealth: Payer: Self-pay | Admitting: Family

## 2022-10-17 MED ORDER — SEMAGLUTIDE(0.25 OR 0.5MG/DOS) 2 MG/3ML ~~LOC~~ SOPN: 0.2500 mg | PEN_INJECTOR | SUBCUTANEOUS | 1 refills | Status: DC

## 2022-10-17 NOTE — Telephone Encounter (Signed)
Patient aware and verbalized understanding. °

## 2022-10-17 NOTE — Telephone Encounter (Signed)
Ozempic 0.25 mg Prescription sent to pharmacy

## 2022-10-17 NOTE — Telephone Encounter (Signed)
Pt wants to let Alyse Low know that he wants to go back to ozempic. Please call in to [place]

## 2022-11-25 ENCOUNTER — Encounter: Payer: Self-pay | Admitting: Family

## 2022-11-25 ENCOUNTER — Ambulatory Visit: Payer: BC Managed Care – PPO | Admitting: Family

## 2022-11-25 VITALS — BP 123/78 | HR 67 | Temp 97.7°F | Ht 74.0 in | Wt 256.0 lb

## 2022-11-25 DIAGNOSIS — I10 Essential (primary) hypertension: Secondary | ICD-10-CM

## 2022-11-25 DIAGNOSIS — Z789 Other specified health status: Secondary | ICD-10-CM

## 2022-11-25 DIAGNOSIS — I2510 Atherosclerotic heart disease of native coronary artery without angina pectoris: Secondary | ICD-10-CM

## 2022-11-25 DIAGNOSIS — F1129 Opioid dependence with unspecified opioid-induced disorder: Secondary | ICD-10-CM

## 2022-11-25 DIAGNOSIS — E1169 Type 2 diabetes mellitus with other specified complication: Secondary | ICD-10-CM | POA: Diagnosis not present

## 2022-11-25 DIAGNOSIS — E559 Vitamin D deficiency, unspecified: Secondary | ICD-10-CM

## 2022-11-25 DIAGNOSIS — Z9861 Coronary angioplasty status: Secondary | ICD-10-CM | POA: Diagnosis not present

## 2022-11-25 DIAGNOSIS — E785 Hyperlipidemia, unspecified: Secondary | ICD-10-CM

## 2022-11-25 DIAGNOSIS — Z79899 Other long term (current) drug therapy: Secondary | ICD-10-CM

## 2022-11-25 DIAGNOSIS — M5442 Lumbago with sciatica, left side: Secondary | ICD-10-CM | POA: Diagnosis not present

## 2022-11-25 DIAGNOSIS — G8929 Other chronic pain: Secondary | ICD-10-CM | POA: Diagnosis not present

## 2022-11-25 DIAGNOSIS — F5101 Primary insomnia: Secondary | ICD-10-CM

## 2022-11-25 DIAGNOSIS — G720 Drug-induced myopathy: Secondary | ICD-10-CM

## 2022-11-25 DIAGNOSIS — J438 Other emphysema: Secondary | ICD-10-CM | POA: Diagnosis not present

## 2022-11-25 DIAGNOSIS — Z7985 Long-term (current) use of injectable non-insulin antidiabetic drugs: Secondary | ICD-10-CM

## 2022-11-25 DIAGNOSIS — E669 Obesity, unspecified: Secondary | ICD-10-CM

## 2022-11-25 LAB — BAYER DCA HB A1C WAIVED: HB A1C (BAYER DCA - WAIVED): 5.8 % — ABNORMAL HIGH (ref 4.8–5.6)

## 2022-11-25 MED ORDER — OXYCODONE HCL 10 MG PO TABS: 10.0000 mg | ORAL_TABLET | Freq: Every day | ORAL | 0 refills | Status: DC | PRN

## 2022-11-25 MED ORDER — REPATHA 140 MG/ML ~~LOC~~ SOSY: 140.0000 mg | PREFILLED_SYRINGE | SUBCUTANEOUS | 2 refills | Status: DC

## 2022-11-25 MED ORDER — OXYCODONE HCL 10 MG PO TABS: 10.0000 mg | ORAL_TABLET | Freq: Every morning | ORAL | 0 refills | Status: DC

## 2022-11-25 NOTE — Patient Instructions (Signed)
Dyslipidemia Dyslipidemia is an imbalance of waxy, fat-like substances (lipids) in the blood. The body needs lipids in small amounts. Dyslipidemia often involves a high level of cholesterol or triglycerides, which are types of lipids. Common forms of dyslipidemia include: High levels of LDL cholesterol. LDL is the type of cholesterol that causes fatty deposits (plaques) to build up in the blood vessels that carry blood away from the heart (arteries). Low levels of HDL cholesterol. HDL cholesterol is the type of cholesterol that protects against heart disease. High levels of HDL remove the LDL buildup from arteries. High levels of triglycerides. Triglycerides are a fatty substance in the blood that is linked to a buildup of plaques in the arteries. What are the causes? There are two main types of dyslipidemia: primary and secondary. Primary dyslipidemia is caused by changes (mutations) in genes that are passed down through families (inherited). These mutations cause several types of dyslipidemia. Secondary dyslipidemia may be caused by various risk factors that can lead to the disease, such as lifestyle choices and certain medical conditions. What increases the risk? You are more likely to develop this condition if you are an older man or if you are a woman who has gone through menopause. Other risk factors include: Having a family history of dyslipidemia. Taking certain medicines, including birth control pills, steroids, some diuretics, and beta-blockers. Eating a diet high in saturated fat. Smoking cigarettes or excessive alcohol intake. Having certain medical conditions such as diabetes, polycystic ovary syndrome (PCOS), kidney disease, liver disease, or hypothyroidism. Not exercising regularly. Being overweight or obese with too much belly fat. What are the signs or symptoms? In most cases, dyslipidemia does not usually cause any symptoms. In severe cases, very high lipid levels can  cause: Fatty bumps under the skin (xanthomas). A white or gray ring around the black center (pupil) of the eye. Very high triglyceride levels can cause inflammation of the pancreas (pancreatitis). How is this diagnosed? Your health care provider may diagnose dyslipidemia based on a routine blood test (fasting blood test). Because most people do not have symptoms of the condition, this blood testing (lipid profile) is done on adults age 20 and older and is repeated every 4-6 years. This test checks: Total cholesterol. This measures the total amount of cholesterol in your blood, including LDL cholesterol, HDL cholesterol, and triglycerides. A healthy number is below 200 mg/dL (5.17 mmol/L). LDL cholesterol. The target number for LDL cholesterol is different for each person, depending on individual risk factors. A healthy number is usually below 100 mg/dL (2.59 mmol/L). Ask your health care provider what your LDL cholesterol should be. HDL cholesterol. An HDL level of 60 mg/dL (1.55 mmol/L) or higher is best because it helps to protect against heart disease. A number below 40 mg/dL (1.03 mmol/L) for men or below 50 mg/dL (1.29 mmol/L) for women increases the risk for heart disease. Triglycerides. A healthy triglyceride number is below 150 mg/dL (1.69 mmol/L). If your lipid profile is abnormal, your health care provider may do other blood tests. How is this treated? Treatment depends on the type of dyslipidemia that you have and your other risk factors for heart disease and stroke. Your health care provider will have a target range for your lipid levels based on this information. Treatment for dyslipidemia starts with lifestyle changes, such as diet and exercise. Your health care provider may recommend that you: Get regular exercise. Make changes to your diet. Quit smoking if you smoke. Limit your alcohol intake. If diet   changes and exercise do not help you reach your goals, your health care provider  may also prescribe medicine to lower lipids. The most commonly prescribed type of medicine lowers your LDL cholesterol (statin drug). If you have a high triglyceride level, your provider may prescribe another type of drug (fibrate) or an omega-3 fish oil supplement, or both. Follow these instructions at home: Eating and drinking  Follow instructions from your health care provider or dietitian about eating or drinking restrictions. Eat a healthy diet as told by your health care provider. This can help you reach and maintain a healthy weight, lower your LDL cholesterol, and raise your HDL cholesterol. This may include: Limiting your calories, if you are overweight. Eating more fruits, vegetables, whole grains, fish, and lean meats. Limiting saturated fat, trans fat, and cholesterol. Do not drink alcohol if: Your health care provider tells you not to drink. You are pregnant, may be pregnant, or are planning to become pregnant. If you drink alcohol: Limit how much you have to: 0-1 drink a day for women. 0-2 drinks a day for men. Know how much alcohol is in your drink. In the [place] one drink equals one 12 oz bottle of beer (355 mL), one 5 oz glass of wine (148 mL), or one 1 oz glass of hard liquor (44 mL). Activity Get regular exercise. Start an exercise and strength training program as told by your health care provider. Ask your health care provider what activities are safe for you. Your health care provider may recommend: 30 minutes of aerobic activity 4-6 days a week. Brisk walking is an example of aerobic activity. Strength training 2 days a week. General instructions Do not use any products that contain nicotine or tobacco. These products include cigarettes, chewing tobacco, and vaping devices, such as e-cigarettes. If you need help quitting, ask your health care provider. Take over-the-counter and prescription medicines only as told by your health care provider. This includes  supplements. Keep all follow-up visits. This is important. Contact a health care provider if: You are having trouble sticking to your exercise or diet plan. You are struggling to quit smoking or to control your use of alcohol. Summary Dyslipidemia often involves a high level of cholesterol or triglycerides, which are types of lipids. Treatment depends on the type of dyslipidemia that you have and your other risk factors for heart disease and stroke. Treatment for dyslipidemia starts with lifestyle changes, such as diet and exercise. Your health care provider may prescribe medicine to lower lipids. This information is not intended to replace advice given to you by your health care provider. Make sure you discuss any questions you have with your health care provider. Document Revised: 02/08/2022 Document Reviewed: 09/11/2020 Elsevier Patient Education  2023 Elsevier Inc.  

## 2022-11-25 NOTE — Progress Notes (Signed)
Subjective:    Patient ID: William Carson, male    DOB: 05/02/1966, 57 y.o.   MRN: [id number]  Chief Complaint  Patient presents with   Medical Management of Chronic Issues   PT presents to the office today for chronic follow up. PT was followed by Cardiologists annually for CAD.   He has COPD and states his breathing is stable. He states he quit smoking 08/24/22. He reports his breathing is doing slightly better. He reports he taking Symbicort  BID. States his breathing is worse in the hot weather.    He can not tolerate statins because myalgia.   Reports he takes CBD gummies as needed for vertigo.  Hypertension This is a chronic problem. The current episode started more than 1 year ago. The problem has been resolved since onset. The problem is controlled. Pertinent negatives include no blurred vision, malaise/fatigue, peripheral edema or shortness of breath. Risk factors for coronary artery disease include dyslipidemia, diabetes mellitus, obesity, male gender, sedentary lifestyle and smoking/tobacco exposure. The current treatment provides moderate improvement.  Diabetes He presents for his follow-up diabetic visit. He has type 2 diabetes mellitus. Pertinent negatives for diabetes include no blurred vision and no foot paresthesias. Risk factors for coronary artery disease include dyslipidemia, diabetes mellitus, hypertension, male sex and sedentary lifestyle. He is following a generally healthy diet. His overall blood glucose range is 110-130 mg/dl. Eye exam is current.  Hyperlipidemia This is a chronic problem. The current episode started more than 1 year ago. The problem is uncontrolled. Exacerbating diseases include obesity. Pertinent negatives include no shortness of breath. Current antihyperlipidemic treatment includes herbal therapy. The current treatment provides mild improvement of lipids. Risk factors for coronary artery disease include dyslipidemia, diabetes mellitus, hypertension,  male sex and a sedentary lifestyle.  Back Pain This is a chronic problem. The current episode started more than 1 year ago. The problem occurs intermittently. The problem has been waxing and waning since onset. The pain is present in the lumbar spine. The pain is at a severity of 6/10. The pain is moderate. He has tried analgesics and bed rest for the symptoms. The treatment provided moderate relief.  Insomnia Primary symptoms: difficulty falling asleep, frequent awakening, no malaise/fatigue.   The current episode started more than one year. The onset quality is gradual. The problem occurs intermittently.   Current opioids rx- Oxycodone 10 mg # meds rx- 30  Effectiveness of current meds-stable Adverse reactions from pain meds-none Morphine equivalent- 15  Pill count performed-No Last drug screen - 11/25/22 ( high risk q18m, moderate risk q53m, low risk yearly ) Urine drug screen today- Yes Was the [facility] reviewed- yes  If yes were their any concerning findings? - none  Pain contract signed on: 11/25/22    Review of Systems  Constitutional:  Negative for malaise/fatigue.  Eyes:  Negative for blurred vision.  Respiratory:  Negative for shortness of breath.   Musculoskeletal:  Positive for back pain.  Psychiatric/Behavioral:  The patient has insomnia.   All other systems reviewed and are negative.      Objective:   Physical Exam Vitals reviewed.  Constitutional:      General: He is not in acute distress.    Appearance: He is well-developed.  HENT:     Head: Normocephalic.     Right Ear: Tympanic membrane normal.     Left Ear: Tympanic membrane normal.  Eyes:     General:        Right eye: No  discharge.        Left eye: No discharge.     Pupils: Pupils are equal, round, and reactive to light.  Neck:     Thyroid: No thyromegaly.  Cardiovascular:     Rate and Rhythm: Normal rate and regular rhythm.     Heart sounds: Normal heart sounds. No murmur heard. Pulmonary:      Effort: Pulmonary effort is normal. No respiratory distress.     Breath sounds: Rhonchi present. No wheezing.  Abdominal:     General: Bowel sounds are normal. There is no distension.     Palpations: Abdomen is soft.     Tenderness: There is no abdominal tenderness.  Musculoskeletal:        General: No tenderness. Normal range of motion.     Cervical back: Normal range of motion and neck supple.  Skin:    General: Skin is warm and dry.     Findings: No erythema or rash.  Neurological:     Mental Status: He is alert and oriented to person, place, and time.     Cranial Nerves: No cranial nerve deficit.     Deep Tendon Reflexes: Reflexes are normal and symmetric.  Psychiatric:        Behavior: Behavior normal.        Thought Content: Thought content normal.        Judgment: Judgment normal.       BP 123/78   Pulse 67   Temp 97.7 F (36.5 C) (Temporal)   Ht 6\' 2"  (1.88 m)   Wt 256 lb (116.1 kg)   SpO2 98%   BMI 32.87 kg/m      Assessment & Plan:  William Carson comes in today with chief complaint of Medical Management of Chronic Issues   Diagnosis and orders addressed:  1. Opioid dependence with opioid-induced disorder (HCC) - Oxycodone HCl 10 MG TABS; Take 1 tablet (10 mg total) by mouth in the morning.  Dispense: 30 tablet; Refill: 0 - Oxycodone HCl 10 MG TABS; Take 1 tablet (10 mg total) by mouth daily as needed.  Dispense: 30 tablet; Refill: 0 - Oxycodone HCl 10 MG TABS; Take 1 tablet (10 mg total) by mouth daily as needed.  Dispense: 30 tablet; Refill: 0 - ToxASSURE Select 13 (MW), Urine - CMP14+EGFR  2. Controlled substance agreement signed - Oxycodone HCl 10 MG TABS; Take 1 tablet (10 mg total) by mouth in the morning.  Dispense: 30 tablet; Refill: 0 - Oxycodone HCl 10 MG TABS; Take 1 tablet (10 mg total) by mouth daily as needed.  Dispense: 30 tablet; Refill: 0 - Oxycodone HCl 10 MG TABS; Take 1 tablet (10 mg total) by mouth daily as needed.  Dispense: 30  tablet; Refill: 0 - ToxASSURE Select 13 (MW), Urine - CMP14+EGFR  3. Chronic left-sided low back pain with left-sided sciatica - Oxycodone HCl 10 MG TABS; Take 1 tablet (10 mg total) by mouth in the morning.  Dispense: 30 tablet; Refill: 0 - Oxycodone HCl 10 MG TABS; Take 1 tablet (10 mg total) by mouth daily as needed.  Dispense: 30 tablet; Refill: 0 - Oxycodone HCl 10 MG TABS; Take 1 tablet (10 mg total) by mouth daily as needed.  Dispense: 30 tablet; Refill: 0 - ToxASSURE Select 13 (MW), Urine - CMP14+EGFR  4. CAD S/P percutaneous coronary angioplasty - Evolocumab (REPATHA) 140 MG/ML SOSY; Inject 140 mg into the skin every 14 (fourteen) days.  Dispense: 2.1 mL; Refill: 2 - CMP14+EGFR -  AMB Referral to Pharmacy Medication Management  5. Other emphysema (HCC) - CMP14+EGFR - AMB Referral to Pharmacy Medication Management  6. Essential hypertension - Evolocumab (REPATHA) 140 MG/ML SOSY; Inject 140 mg into the skin every 14 (fourteen) days.  Dispense: 2.1 mL; Refill: 2 - CMP14+EGFR - AMB Referral to Pharmacy Medication Management  7. Hyperlipidemia associated with type 2 diabetes mellitus (HCC) - Evolocumab (REPATHA) 140 MG/ML SOSY; Inject 140 mg into the skin every 14 (fourteen) days.  Dispense: 2.1 mL; Refill: 2 - CMP14+EGFR - AMB Referral to Pharmacy Medication Management  8. Obesity (BMI 30-39.9) - CMP14+EGFR - AMB Referral to Pharmacy Medication Management  9. Type 2 diabetes mellitus with other specified complication, without long-term current use of insulin (HCC) - Evolocumab (REPATHA) 140 MG/ML SOSY; Inject 140 mg into the skin every 14 (fourteen) days.  Dispense: 2.1 mL; Refill: 2 - Bayer DCA Hb A1c Waived - CMP14+EGFR - AMB Referral to Pharmacy Medication Management  10. Vitamin D deficiency - CMP14+EGFR  11. Statin myopathy  - Evolocumab (REPATHA) 140 MG/ML SOSY; Inject 140 mg into the skin every 14 (fourteen) days.  Dispense: 2.1 mL; Refill: 2 - CMP14+EGFR -  AMB Referral to Pharmacy Medication Management  12. Statin intolerance - Evolocumab (REPATHA) 140 MG/ML SOSY; Inject 140 mg into the skin every 14 (fourteen) days.  Dispense: 2.1 mL; Refill: 2 - CMP14+EGFR - AMB Referral to Pharmacy Medication Management  13. Primary insomnia  - CMP14+EGFR   Labs pending Start Repatha  Referral to Clinical Pharm to help get Repatha covered Patient reviewed in [place] controlled database, no flags noted. Contract and drug screen up dated today.  Health Maintenance reviewed Diet and exercise encouraged  Follow up plan: 3 months   Jannifer Rodney, FNP

## 2022-11-26 LAB — CMP14+EGFR
ALT: 31 IU/L (ref 0–44)
AST: 19 IU/L (ref 0–40)
Albumin/Globulin Ratio: 2.1 (ref 1.2–2.2)
Albumin: 4.7 g/dL (ref 3.8–4.9)
Alkaline Phosphatase: 43 IU/L — ABNORMAL LOW (ref 44–121)
BUN/Creatinine Ratio: 11 (ref 9–20)
BUN: 9 mg/dL (ref 6–24)
Bilirubin Total: 0.3 mg/dL (ref 0.0–1.2)
CO2: 23 mmol/L (ref 20–29)
Calcium: 9.7 mg/dL (ref 8.7–10.2)
Chloride: 106 mmol/L (ref 96–106)
Creatinine, Ser: 0.85 mg/dL (ref 0.76–1.27)
Globulin, Total: 2.2 g/dL (ref 1.5–4.5)
Glucose: 62 mg/dL — ABNORMAL LOW (ref 70–99)
Potassium: 4.2 mmol/L (ref 3.5–5.2)
Sodium: 144 mmol/L (ref 134–144)
Total Protein: 6.9 g/dL (ref 6.0–8.5)
eGFR: 101 mL/min/{1.73_m2} (ref >59)

## 2022-11-27 LAB — TOXASSURE SELECT 13 (MW), URINE

## 2022-11-28 ENCOUNTER — Telehealth: Payer: Self-pay

## 2022-11-28 NOTE — Progress Notes (Signed)
   Care Guide Note  11/28/2022 Name: William Carson MRN: [id number] DOB: Jun 09, 1966  Referred by: Junie Spencer, FNP Reason for referral : Care Coordination (Outreach to schedule with Pharm d )   William Carson is a 57 y.o. year old male who is a primary care patient of Junie Spencer, FNP. Milinda Cave was referred to the pharmacist for assistance related to HTN, HLD, and DM.    Successful contact was made with the patient to discuss pharmacy services including being ready for the pharmacist to call at least 5 minutes before the scheduled appointment time, to have medication bottles and any blood sugar or blood pressure readings ready for review. The patient agreed to meet with the pharmacist via with the pharmacist via telephone visit on (date/time).  01/07/2023  Penne Lash, RMA Care Guide [facility]  [place], [place] [address] Direct Dial: [phone] [email or web address]

## 2022-11-29 ENCOUNTER — Telehealth: Payer: Self-pay

## 2022-11-29 NOTE — Telephone Encounter (Signed)
PARAS TELLMAN ([employer]) Rx #: [id number] Repatha SureClick 140MG /ML auto-injectors Form [employer] [place] [employer] Form Created 4 days ago Sent to Plan 17 hours ago Plan Response 17 hours ago Submit Clinical Questions 1 minute ago Determination Wait for Determination Please wait for [employer] [place] [employer] [id number] to return a determination.

## 2022-11-29 NOTE — Telephone Encounter (Signed)
ZAELYN YOUNGS ([employer]) Rx #: [id number] Repatha SureClick 140MG /ML auto-injectors Form [employer] account executive Form Created 4 days ago Sent to [employer] 1 day ago Plan Response 1 day ago Submit Clinical Questions 7 hours ago Determination Favorable 6 hours ago Message from [employer] . Authorization Expiration Date: Nov 29, 2023.  Pharmacy informed

## 2023-01-07 ENCOUNTER — Ambulatory Visit (INDEPENDENT_AMBULATORY_CARE_PROVIDER_SITE_OTHER): Payer: BC Managed Care – PPO | Admitting: Pharmacist

## 2023-01-07 DIAGNOSIS — E785 Hyperlipidemia, unspecified: Secondary | ICD-10-CM

## 2023-01-07 DIAGNOSIS — E1169 Type 2 diabetes mellitus with other specified complication: Secondary | ICD-10-CM

## 2023-01-07 NOTE — Progress Notes (Unsigned)
01/07/2023 Name: William Carson MRN: [id number] DOB: 01/05/1966  Chief Complaint  Patient presents with   Medication Management    T2DM, HLD    William Carson is Carson 57 y.o. year old male who presented for Carson telephone visit.   They were referred to the pharmacist by their PCP for assistance in managing diabetes and hyperlipidemia.    Subjective:  Care Team: Primary Care Provider: Junie Spencer, FNP ; Next Scheduled Visit: 02/27/23  Medication Access/Adherence  Current Pharmacy:  [facility] [place], [place] - [address] [place] [address] Phone: [phone] Fax: [phone]   Patient reports affordability concerns with their medications: No , Repatha and Ozempic are affordable Patient reports access/transportation concerns to their pharmacy: No  Patient reports adherence concerns with their medications:  No     Diabetes:  Current medications: Ozempic 0.25 mg weekly, metformin 500 mg with breakfast  Medications tried in the past: Trulicity (changed to Ozempic due to Trulicity causing body aches), glipizide (given Carson 7 day supply in 2023 but was not continued), Tresiba (DC'd in 2023), Januvia (DC'd in 2022)  Current glucose readings: 110-120 Checks ~3x/week  Patient denies hypoglycemic s/sx including dizziness, shakiness, sweating. Patient denies hyperglycemic symptoms including polyuria, polydipsia, polyphagia, nocturia, neuropathy, blurred vision.  HLD:  Current medications: Repatha (has tolerated 2 shots without issue), ezetimibe 10 mg daily -Repatha copay was $40 -Patient is statin intolerant    Objective:  Lab Results  Component Value Date   HGBA1C 5.8 (H) 11/25/2022    Lab Results  Component Value Date   CREATININE 0.85 11/25/2022   BUN 9 11/25/2022   NA 144 11/25/2022   K 4.2 11/25/2022   CL 106 11/25/2022   CO2 23 11/25/2022    Lab Results  Component Value Date   CHOL 153 06/18/2022   HDL 32 (L)  06/18/2022   LDLCALC 101 (H) 06/18/2022   TRIG 108 06/18/2022   CHOLHDL 4.8 06/18/2022    Medications Reviewed Today     Reviewed by William Carson, RPH (Pharmacist) on 01/07/23 at 1020  Med List Status: <None>   Medication Order Taking? Sig Documenting Provider Last Dose Status Informant  albuterol (VENTOLIN HFA) 108 (90 Base) MCG/ACT inhaler [id number] Yes Inhale 2 puffs into the lungs every 6 (six) hours as needed for wheezing or shortness of breath. William Masters, FNP Taking Active   amLODipine (NORVASC) 5 MG tablet [id number] Yes Take 1 tablet (5 mg total) by mouth daily. William Spencer, FNP Taking Active   aspirin EC 81 MG tablet [id number] No Take 1 tablet (81 mg total) by mouth daily. Swallow whole.  Patient not taking: Reported on 01/07/2023   William Bunting, MD Not Taking Active   cetirizine (ZYRTEC) 10 MG tablet [id number]  Take by mouth. [provider]  Active   Evolocumab (REPATHA) 140 MG/ML SOSY [id number] Yes Inject 140 mg into the skin every 14 (fourteen) days. William Spencer, FNP Taking Active   ezetimibe (ZETIA) 10 MG tablet [id number]  Take 1 tablet (10 mg total) by mouth daily. William Spencer, FNP  Active   glipiZIDE (GLUCOTROL) 10 MG tablet [id number] No Take 1 tablet (10 mg total) by mouth 2 (two) times daily before Carson meal.  Patient not taking: Reported on 01/07/2023   William Spencer, FNP Not Taking Active   glucose blood (CONTOUR NEXT TEST) test strip [id number]  Test BS up to  5 times daily Dx E11.69 William Rodney A, FNP  Active   lisinopril (ZESTRIL) 10 MG tablet [id number] Yes Take 1 tablet (10 mg total) by mouth daily. William Spencer, FNP Taking Active   metFORMIN (GLUCOPHAGE-XR) 500 MG 24 hr tablet [id number] Yes Take 1 tablet (500 mg total) by mouth daily with breakfast. William Spencer, FNP Taking Active   nitroGLYCERIN (NITROSTAT) 0.4 MG SL tablet [id number]  Place 1 tablet (0.4 mg total) under the tongue every 5 (five) minutes as needed for  chest pain. May repeat up to 3 doses. William Loffler, PA-C  Active            Med Note ([place]ON[place] Felicity Pellegrini Jan 09, 2022  9:23 AM) On hand.  nystatin (MYCOSTATIN/NYSTOP) powder [id number]  Apply 1 Application topically 3 (three) times daily. William Rodney A, FNP  Active   Oxycodone HCl 10 MG TABS [id number]  Take 1 tablet (10 mg total) by mouth in the morning. William Rodney A, FNP  Active   Oxycodone HCl 10 MG TABS [id number]  Take 1 tablet (10 mg total) by mouth daily as needed. William Rodney A, FNP  Active   Oxycodone HCl 10 MG TABS [id number]  Take 1 tablet (10 mg total) by mouth daily as needed. William Spencer, FNP  Active   Semaglutide,0.25 or 0.5MG /DOS, 2 MG/3ML William Carson [id number] Yes Inject 0.25 mg into the skin once Carson week. William Spencer, FNP Taking Active   SYMBICORT 160-4.5 MCG/ACT inhaler [id number] Yes INH 2 PUFFS TWICE DAILY William Spencer, FNP Taking Active   traZODone (DESYREL) 100 MG tablet [id number] Yes Take 1 tablet (100 mg total) by mouth at bedtime. William Spencer, FNP Taking Active   TRUEplus Lancets 33G MISC [id number]  Test BS up to 5 times daily Dx E11.69 William Spencer, FNP  Active              Assessment/Plan:   Diabetes: - Currently controlled based on A1c (5.8%) - Patient has tolerated Ozempic better than Trulicity. Given A1c at goal, will defer dose increase at this time. May consider dose increase if A1c increases.  - Continue metformin - Glipizide taken off patient's list. Was prescribed Carson 7 day supply one year ago and has not taken since.  - Reviewed long term cardiovascular and renal outcomes of uncontrolled blood sugar - Reviewed goal A1c, goal fasting, and goal 2 hour post prandial glucose   Hyperlipidemia/ASCVD Risk Reduction: - Currently uncontrolled based on LDL (101). Patient is statin intolerant.  - He was able to pick up Repatha for $40 and has tolerated 2 doses.  - Continue ezetimibe  - Reviewed long term complications of  uncontrolled cholesterol   Follow Up Plan:  Pharmacist: PRN PCP: August 2024  William Carson, William Carson.D. PGY-2 Ambulatory Care Pharmacy Resident  William Carson, PharmD, BCACP Clinical Pharmacist, [facility]

## 2023-01-08 ENCOUNTER — Encounter: Payer: Self-pay | Admitting: Pharmacist

## 2023-01-21 ENCOUNTER — Other Ambulatory Visit: Payer: Self-pay | Admitting: Family

## 2023-01-21 DIAGNOSIS — J439 Emphysema, unspecified: Secondary | ICD-10-CM

## 2023-02-27 ENCOUNTER — Ambulatory Visit (INDEPENDENT_AMBULATORY_CARE_PROVIDER_SITE_OTHER): Payer: BC Managed Care – PPO | Admitting: Family

## 2023-02-27 ENCOUNTER — Encounter: Payer: Self-pay | Admitting: Family

## 2023-02-27 VITALS — BP 118/75 | HR 76 | Temp 98.6°F | Ht 74.0 in | Wt 266.0 lb

## 2023-02-27 DIAGNOSIS — I10 Essential (primary) hypertension: Secondary | ICD-10-CM

## 2023-02-27 DIAGNOSIS — F1129 Opioid dependence with unspecified opioid-induced disorder: Secondary | ICD-10-CM | POA: Diagnosis not present

## 2023-02-27 DIAGNOSIS — Z79899 Other long term (current) drug therapy: Secondary | ICD-10-CM

## 2023-02-27 DIAGNOSIS — J441 Chronic obstructive pulmonary disease with (acute) exacerbation: Secondary | ICD-10-CM

## 2023-02-27 DIAGNOSIS — J438 Other emphysema: Secondary | ICD-10-CM

## 2023-02-27 DIAGNOSIS — F5101 Primary insomnia: Secondary | ICD-10-CM

## 2023-02-27 DIAGNOSIS — M5442 Lumbago with sciatica, left side: Secondary | ICD-10-CM | POA: Diagnosis not present

## 2023-02-27 DIAGNOSIS — E1169 Type 2 diabetes mellitus with other specified complication: Secondary | ICD-10-CM

## 2023-02-27 DIAGNOSIS — Z9861 Coronary angioplasty status: Secondary | ICD-10-CM

## 2023-02-27 DIAGNOSIS — I2510 Atherosclerotic heart disease of native coronary artery without angina pectoris: Secondary | ICD-10-CM

## 2023-02-27 DIAGNOSIS — G8929 Other chronic pain: Secondary | ICD-10-CM

## 2023-02-27 DIAGNOSIS — E669 Obesity, unspecified: Secondary | ICD-10-CM

## 2023-02-27 DIAGNOSIS — G720 Drug-induced myopathy: Secondary | ICD-10-CM

## 2023-02-27 MED ORDER — OXYCODONE HCL 10 MG PO TABS
10.0000 mg | ORAL_TABLET | Freq: Every day | ORAL | 0 refills | Status: DC | PRN
Start: 2023-03-28 — End: 2023-05-30

## 2023-02-27 MED ORDER — DOXYCYCLINE HYCLATE 100 MG PO TABS: 100.0000 mg | ORAL_TABLET | Freq: Two times a day (BID) | ORAL | 0 refills | Status: DC

## 2023-02-27 MED ORDER — OXYCODONE HCL 10 MG PO TABS
10.0000 mg | ORAL_TABLET | Freq: Every morning | ORAL | 0 refills | Status: DC
Start: 2023-02-27 — End: 2023-05-30

## 2023-02-27 MED ORDER — OXYCODONE HCL 10 MG PO TABS
10.0000 mg | ORAL_TABLET | Freq: Every day | ORAL | 0 refills | Status: DC | PRN
Start: 2023-04-25 — End: 2023-05-30

## 2023-02-27 MED ORDER — PREDNISONE 10 MG (21) PO TBPK
ORAL_TABLET | ORAL | 0 refills | Status: DC
Start: 2023-02-27 — End: 2023-04-02

## 2023-02-27 NOTE — Progress Notes (Signed)
Subjective:    Patient ID: William Carson, male    DOB: Aug 19, 1965, 57 y.o.   MRN: [id number]  Chief Complaint  Patient presents with   Medical Management of Chronic Issues    3 month   PT presents to the office today for chronic follow up. PT was followed by Cardiologists annually for CAD. States he has not seen them since COVID.    He has COPD and states his breathing is stable. He states he quit smoking 08/24/22. He reports his breathing is doing slightly better. He reports he taking Symbicort  BID. States his breathing is worse in the hot weather.   He is complaining of cough for the last week. Has had negative COVID test.    He can not tolerate statins because myalgia.   Hypertension This is a chronic problem. The current episode started more than 1 year ago. The problem has been resolved since onset. Associated symptoms include headaches, malaise/fatigue and shortness of breath. Pertinent negatives include no blurred vision or peripheral edema. Risk factors for coronary artery disease include dyslipidemia, diabetes mellitus, male gender, obesity and sedentary lifestyle. The current treatment provides moderate improvement.  Diabetes He presents for his follow-up diabetic visit. He has type 2 diabetes mellitus. Hypoglycemia symptoms include headaches. Pertinent negatives for diabetes include no blurred vision and no foot paresthesias. Symptoms are stable. Risk factors for coronary artery disease include dyslipidemia, diabetes mellitus, hypertension and sedentary lifestyle. He is following a generally healthy diet. His overall blood glucose range is 130-140 mg/dl.  Hyperlipidemia This is a chronic problem. The current episode started more than 1 year ago. The problem is controlled. Exacerbating diseases include obesity. Associated symptoms include myalgias and shortness of breath. Treatments tried: rephatha. The current treatment provides moderate improvement of lipids. Risk factors for  coronary artery disease include dyslipidemia, diabetes mellitus, hypertension, male sex and a sedentary lifestyle.  Back Pain This is a chronic problem. The current episode started more than 1 year ago. The problem occurs intermittently. The problem has been waxing and waning since onset. The pain is present in the lumbar spine. The quality of the pain is described as aching. The pain is at a severity of 5/10. The pain is moderate. Associated symptoms include a fever (low grade) and headaches.  Insomnia Primary symptoms: sleep disturbance, difficulty falling asleep, frequent awakening, malaise/fatigue.   The current episode started more than one year. The onset quality is gradual. The problem occurs intermittently. Past treatments include medication. The treatment provided moderate relief.  Cough This is a new problem. The current episode started 1 to 4 weeks ago. The problem has been gradually worsening. The problem occurs every few minutes. The cough is Productive of sputum and productive of brown sputum. Associated symptoms include chills, a fever (low grade), headaches, myalgias, nasal congestion, shortness of breath and wheezing. Pertinent negatives include no ear congestion or ear pain.    Current opioids rx- Oxycodone 10 mg # meds rx- 30  Effectiveness of current meds-stable Adverse reactions from pain meds-none Morphine equivalent- 15   Pill count performed-No Last drug screen - 11/25/22 ( high risk q19m, moderate risk q11m, low risk yearly ) Urine drug screen today- Yes Was the [facility] reviewed- yes             If yes were their any concerning findings? - none   Pain contract signed on: 11/25/22  Review of Systems  Constitutional:  Positive for chills, fever (low grade) and malaise/fatigue.  HENT:  Negative for ear pain.   Eyes:  Negative for blurred vision.  Respiratory:  Positive for cough, shortness of breath and wheezing.   Musculoskeletal:  Positive for back pain and myalgias.   Neurological:  Positive for headaches.  Psychiatric/Behavioral:  Positive for sleep disturbance. The patient has insomnia.   All other systems reviewed and are negative.      Objective:   Physical Exam Vitals reviewed.  Constitutional:      General: He is not in acute distress.    Appearance: He is well-developed. He is obese.  HENT:     Head: Normocephalic.     Right Ear: External ear normal.     Left Ear: External ear normal.  Eyes:     General:        Right eye: No discharge.        Left eye: No discharge.     Pupils: Pupils are equal, round, and reactive to light.  Neck:     Thyroid: No thyromegaly.  Cardiovascular:     Rate and Rhythm: Normal rate and regular rhythm.     Heart sounds: Normal heart sounds. No murmur heard. Pulmonary:     Effort: Pulmonary effort is normal. No respiratory distress.     Breath sounds: Wheezing and rhonchi present.  Abdominal:     General: Bowel sounds are normal. There is no distension.     Palpations: Abdomen is soft.     Tenderness: There is no abdominal tenderness.  Musculoskeletal:        General: No tenderness. Normal range of motion.     Cervical back: Normal range of motion and neck supple.  Skin:    General: Skin is warm and dry.     Findings: No erythema or rash.  Neurological:     Mental Status: He is alert and oriented to person, place, and time.     Cranial Nerves: No cranial nerve deficit.     Deep Tendon Reflexes: Reflexes are normal and symmetric.  Psychiatric:        Behavior: Behavior normal.        Thought Content: Thought content normal.        Judgment: Judgment normal.      Diabetic Foot Exam - Simple   Simple Foot Form Diabetic Foot exam was performed with the following findings: Yes 02/27/2023 12:43 PM  Visual Inspection No deformities, no ulcerations, no other skin breakdown bilaterally: Yes Sensation Testing Intact to touch and monofilament testing bilaterally: Yes Pulse Check Posterior Tibialis  and Dorsalis pulse intact bilaterally: Yes Comments     BP 118/75   Pulse 76   Temp 98.6 F (37 C)   Ht 6\' 2"  (1.88 m)   Wt 266 lb (120.7 kg)   SpO2 98%   BMI 34.15 kg/m      Assessment & Plan:   William Carson comes in today with chief complaint of Medical Management of Chronic Issues (3 month)   Diagnosis and orders addressed:  1. Opioid dependence with opioid-induced disorder (HCC) - Oxycodone HCl 10 MG TABS; Take 1 tablet (10 mg total) by mouth in the morning.  Dispense: 30 tablet; Refill: 0 - Oxycodone HCl 10 MG TABS; Take 1 tablet (10 mg total) by mouth daily as needed.  Dispense: 30 tablet; Refill: 0 - Oxycodone HCl 10 MG TABS; Take 1 tablet (10 mg total) by mouth daily as needed.  Dispense: 30 tablet; Refill: 0  2. Controlled substance agreement signed - Oxycodone HCl  10 MG TABS; Take 1 tablet (10 mg total) by mouth in the morning.  Dispense: 30 tablet; Refill: 0 - Oxycodone HCl 10 MG TABS; Take 1 tablet (10 mg total) by mouth daily as needed.  Dispense: 30 tablet; Refill: 0 - Oxycodone HCl 10 MG TABS; Take 1 tablet (10 mg total) by mouth daily as needed.  Dispense: 30 tablet; Refill: 0  3. Chronic left-sided low back pain with left-sided sciatica - Oxycodone HCl 10 MG TABS; Take 1 tablet (10 mg total) by mouth in the morning.  Dispense: 30 tablet; Refill: 0 - Oxycodone HCl 10 MG TABS; Take 1 tablet (10 mg total) by mouth daily as needed.  Dispense: 30 tablet; Refill: 0 - Oxycodone HCl 10 MG TABS; Take 1 tablet (10 mg total) by mouth daily as needed.  Dispense: 30 tablet; Refill: 0  4. CAD S/P percutaneous coronary angioplasty  5. Other emphysema (HCC)  6. Essential hypertension   7. Hyperlipidemia associated with type 2 diabetes mellitus (HCC)  8. Obesity (BMI 30-39.9)  9. Type 2 diabetes mellitus with other specified complication, without long-term current use of insulin (HCC) - Microalbumin / creatinine urine ratio  10. Statin myopathy  11. Primary  insomnia  12. COPD exacerbation (HCC) - doxycycline (VIBRA-TABS) 100 MG tablet; Take 1 tablet (100 mg total) by mouth 2 (two) times daily.  Dispense: 20 tablet; Refill: 0 - predniSONE (STERAPRED UNI-PAK 21 TAB) 10 MG (21) TBPK tablet; Use as directed  Dispense: 21 tablet; Refill: 0    Patient reviewed in [place] controlled database, no flags noted. Contract and drug screen are up to date.  Health Maintenance reviewed Diet and exercise encouraged  Follow up plan: 3 months    Jannifer Rodney, FNP

## 2023-02-27 NOTE — Patient Instructions (Signed)
Chronic Obstructive Pulmonary Disease Exacerbation  Chronic obstructive pulmonary disease (COPD) is a long-term (chronic) lung problem. When you have COPD, it can feel harder to breathe in or out. COPD exacerbation is a flare-up of symptoms when breathing gets worse and more treatment may be needed. Without treatment, flare-ups can be life-threatening. If they happen often, your lungs can become more damaged. What are the causes? Not taking your usual COPD medicines as told by your health care provider. A cold or the flu, which can cause infection in your lungs. Being exposed to things that make your breathing worse, such as: Smoke. Air pollution. Fumes. Dust. Allergies. Weather changes. What are the signs or symptoms? Symptoms do not get better or get worse even if you take your medicines as told by your provider. Symptoms may include: More shortness of breath. You may only be able to speak one or two words at a time. More coughing or mucus from your lungs. More wheezing or chest tightness. Being more tired and having less energy. Confusion. How is this diagnosed? This condition is diagnosed based on: Symptoms that get worse. Your medical history. A physical exam. You may also have tests, including: A chest X-ray. Blood or mucus tests. How is this treated? You may be able to stay home or you may need to go to the hospital. Treatment may include: Taking medicines. These may include: Inhalers. These have medicines in them that you breathe in. These may be more of what you already take or they may be new. Steroids. These reduce inflammation in the airways. These may be inhaled, taken by mouth, or given in an IV. Antibiotics. These treat infection. Using oxygen. Using a device to help you clear mucus. Follow these instructions at home: Medicines Take your medicines only as told by your provider. If you were given antibiotics or steroids, take them as told by your provider. Do  not stop taking them even if you start to feel better. Lifestyle Several times a day, wash your hands with soap and water for at least 20 seconds. If you cannot use soap and water, use hand sanitizer. This may help keep you from getting an infection. Avoid being around crowds or people who are sick. Do not smoke or use any products that contain nicotine or tobacco. If you need help quitting, ask your provider. Return to your normal activities when your provider says that it's safe. Use breathing methods to control your stress and catch your breath. How is this prevented? Follow your COPD action plan. The action plan tells you what to do if you're feeling good and what to do when you start feeling worse. Discuss the plan often with your provider. Make sure you get all the shots, also called vaccines, that your provider recommends. Ask your provider about a flu shot and a pneumonia shot. Use oxygen therapy if told by your provider. If you need home oxygen therapy, ask your provider how often to check your oxygen level with a device called an oximeter. Keep all follow-up visits to review your COPD action plan. Your provider will want to check on your condition often to keep you healthy and out of the hospital. Contact a health care provider if: Your COPD symptoms get worse. You have a fever or chills. You have trouble doing daily activities. You have trouble breathing even when you are resting. Get help right away if: You are short of breath and cannot: Talk in full sentences. Do normal activities. You have chest  pain. You feel confused. These symptoms may be an emergency. Call 911 right away. Do not wait to see if the symptoms will go away. Do not drive yourself to the hospital. This information is not intended to replace advice given to you by your health care provider. Make sure you discuss any questions you have with your health care provider. Document Revised: 09/23/2022 Document  Reviewed: 09/23/2022 Elsevier Patient Education  2024 [employer].

## 2023-03-15 ENCOUNTER — Other Ambulatory Visit: Payer: Self-pay | Admitting: Family

## 2023-03-15 DIAGNOSIS — I10 Essential (primary) hypertension: Secondary | ICD-10-CM

## 2023-03-15 DIAGNOSIS — E785 Hyperlipidemia, unspecified: Secondary | ICD-10-CM

## 2023-03-15 DIAGNOSIS — E1169 Type 2 diabetes mellitus with other specified complication: Secondary | ICD-10-CM

## 2023-03-15 DIAGNOSIS — Z789 Other specified health status: Secondary | ICD-10-CM

## 2023-03-15 DIAGNOSIS — I2510 Atherosclerotic heart disease of native coronary artery without angina pectoris: Secondary | ICD-10-CM

## 2023-03-15 DIAGNOSIS — T466X5A Adverse effect of antihyperlipidemic and antiarteriosclerotic drugs, initial encounter: Secondary | ICD-10-CM

## 2023-03-15 DIAGNOSIS — J439 Emphysema, unspecified: Secondary | ICD-10-CM

## 2023-04-02 ENCOUNTER — Encounter: Payer: Self-pay | Admitting: Family Medicine

## 2023-04-02 ENCOUNTER — Telehealth: Payer: Self-pay | Admitting: Family

## 2023-04-02 ENCOUNTER — Other Ambulatory Visit: Payer: Self-pay | Admitting: Family

## 2023-04-02 ENCOUNTER — Ambulatory Visit (INDEPENDENT_AMBULATORY_CARE_PROVIDER_SITE_OTHER): Payer: BC Managed Care – PPO | Admitting: Family Medicine

## 2023-04-02 VITALS — BP 110/71 | HR 97 | Temp 97.6°F | Ht 74.0 in | Wt 270.2 lb

## 2023-04-02 DIAGNOSIS — E7800 Pure hypercholesterolemia, unspecified: Secondary | ICD-10-CM

## 2023-04-02 DIAGNOSIS — J441 Chronic obstructive pulmonary disease with (acute) exacerbation: Secondary | ICD-10-CM | POA: Diagnosis not present

## 2023-04-02 MED ORDER — PREDNISONE 20 MG PO TABS
40.0000 mg | ORAL_TABLET | Freq: Every day | ORAL | 0 refills | Status: AC
Start: 2023-04-02 — End: 2023-04-07

## 2023-04-02 MED ORDER — AMOXICILLIN-POT CLAVULANATE 875-125 MG PO TABS
1.0000 | ORAL_TABLET | Freq: Two times a day (BID) | ORAL | 0 refills | Status: AC
Start: 2023-04-02 — End: 2023-04-12

## 2023-04-02 MED ORDER — AZITHROMYCIN 250 MG PO TABS
ORAL_TABLET | ORAL | 0 refills | Status: DC
Start: 2023-04-02 — End: 2023-05-30

## 2023-04-02 MED ORDER — GUAIFENESIN ER 600 MG PO TB12
600.0000 mg | ORAL_TABLET | Freq: Two times a day (BID) | ORAL | 0 refills | Status: AC
Start: 2023-04-02 — End: 2023-04-16

## 2023-04-02 NOTE — Telephone Encounter (Signed)
  Prescription Request  04/02/2023  Is this a "Controlled Substance" medicine? no  Have you seen your PCP in the last 2 weeks? no  If YES, route message to pool  -  If NO, patient needs to be scheduled for appointment.  What is the name of the medication or equipment? Ezetimibe 10 mg  Have you contacted your pharmacy to request a refill? yes   Which pharmacy would you like this sent to? [facility]   Patient notified that their request is being sent to the clinical staff for review and that they should receive a response within 2 business days.

## 2023-04-02 NOTE — Telephone Encounter (Signed)
  Prescription Request  04/02/2023  Is this a "Controlled Substance" medicine? no  Have you seen your PCP in the last 2 weeks? no  If YES, route message to pool  -  If NO, patient needs to be scheduled for appointment.  What is the name of the medication or equipment? Semaglutide 0.25  Have you contacted your pharmacy to request a refill? yes   Which pharmacy would you like this sent to? [facility]   Patient notified that their request is being sent to the clinical staff for review and that they should receive a response within 2 business days.

## 2023-04-02 NOTE — Progress Notes (Signed)
Subjective:  Patient ID: William Carson, male    DOB: 13-Apr-1966, 57 y.o.   MRN: [id number]  Patient Care Team: Junie Spencer, FNP as PCP - General (Family Medicine) Jens Som Madolyn Frieze, MD as PCP - Cardiology (Cardiology) [facility]   Chief Complaint:  Cough and Nasal Congestion (Was seen 02/27/23 and states he is better but still has cough and nasal congestion.  Also states he has still been having on and off body aches. )   HPI: William Carson is a 57 y.o. male presenting on 04/02/2023 for Cough and Nasal Congestion (Was seen 02/27/23 and states he is better but still has cough and nasal congestion.  Also states he has still been having on and off body aches. )   Pt reports he was treated for a COPD exacerbation last month with doxycycline. States he got better but did not get back to baseline.   Cough This is a recurrent problem. The current episode started 1 to 4 weeks ago. The problem has been gradually worsening. The problem occurs constantly. The cough is Productive of sputum. Associated symptoms include chills, nasal congestion, postnasal drip, rhinorrhea, shortness of breath and wheezing. Pertinent negatives include no chest pain, ear congestion, ear pain, fever, headaches, heartburn, hemoptysis, myalgias, rash, sore throat, sweats or weight loss. Nothing aggravates the symptoms. He has tried a beta-agonist inhaler and OTC cough suppressant for the symptoms. The treatment provided no relief. His past medical history is significant for COPD.       Relevant past medical, surgical, family, and social history reviewed and updated as indicated.  Allergies and medications reviewed and updated. Data reviewed: Chart in Epic.   Past Medical History:  Diagnosis Date   Adenomatous polyps    Common migraine with intractable migraine 01/20/2019   COPD (chronic obstructive pulmonary disease) (HCC)    Coronary artery disease    Diabetes mellitus without  complication (HCC)    Diverticulosis    Hemorrhoids    Hyperlipidemia    Influenza A 10/20/2015   Obesity     Past Surgical History:  Procedure Laterality Date   ANTERIOR CRUCIATE LIGAMENT REPAIR     S/P   CORONARY STENT PLACEMENT  2011   2 stents    Social History   Socioeconomic History   Marital status: Married    Spouse name: Not on file   Number of children: 0   Years of education: 12   Highest education level: 12th grade  Occupational History   Occupation: Gaffer: OTHER    Comment: [place], [place]  Tobacco Use   Smoking status: Former    Current packs/day: 1.00    Average packs/day: 1 pack/day for 30.0 years (30.0 ttl pk-yrs)    Types: Cigarettes   Smokeless tobacco: Former  Building services engineer status: Never Used  Substance and Sexual Activity   Alcohol use: Yes    Alcohol/week: 1.0 standard drink of alcohol    Types: 1 Standard drinks or equivalent per week   Drug use: No   Sexual activity: Yes  Other Topics Concern   Not on file  Social History Narrative   Lives with wife   Right handed    Caffeine use: Coffee every Sunday   Diet-sun drop daily or coke zero   Has been married twice   Married to current wife for 3 years   No children   Social Determinants of Health  Financial Resource Strain: Not on file  Food Insecurity: Not on file  Transportation Needs: Patient Declined (11/21/2022)   PRAPARE - Transportation    Lack of Transportation (Medical): Patient declined    Lack of Transportation (Non-Medical): Patient declined  Physical Activity: Unknown (11/21/2022)   Exercise Vital Sign    Days of Exercise per Week: 6 days    Minutes of Exercise per Session: Patient declined  Stress: No Stress Concern Present (11/21/2022)   [employer] of Occupational Health - Occupational Stress Questionnaire    Feeling of Stress : Not at all  Social Connections: Unknown (11/21/2022)   Social Connection and Isolation Panel [NHANES]     Frequency of Communication with Friends and Family: Not on file    Frequency of Social Gatherings with Friends and Family: Not on file    Attends Religious Services: Not on file    Active Member of Clubs or Organizations: Patient declined    Attends Banker Meetings: Not on file    Marital Status: Patient declined  Intimate Partner Violence: Not on file    Outpatient Encounter Medications as of 04/02/2023  Medication Sig   amoxicillin-clavulanate (AUGMENTIN) 875-125 MG tablet Take 1 tablet by mouth 2 (two) times daily for 10 days.   azithromycin (ZITHROMAX Z-PAK) 250 MG tablet As directed   guaiFENesin (MUCINEX) 600 MG 12 hr tablet Take 1 tablet (600 mg total) by mouth 2 (two) times daily for 14 days.   predniSONE (DELTASONE) 20 MG tablet Take 2 tablets (40 mg total) by mouth daily with breakfast for 5 days. 2 po daily for 5 days   albuterol (VENTOLIN HFA) 108 (90 Base) MCG/ACT inhaler Inhale 2 puffs into the lungs every 6 (six) hours as needed for wheezing or shortness of breath.   amLODipine (NORVASC) 5 MG tablet Take 1 tablet (5 mg total) by mouth daily.   aspirin EC 81 MG tablet Take 1 tablet (81 mg total) by mouth daily. Swallow whole.   cetirizine (ZYRTEC) 10 MG tablet Take by mouth.   Evolocumab (REPATHA SURECLICK) 140 MG/ML SOAJ INJECT 140 MG EVERY 2 WEEKS   ezetimibe (ZETIA) 10 MG tablet Take 1 tablet (10 mg total) by mouth daily.   glucose blood (CONTOUR NEXT TEST) test strip Test BS up to 5 times daily Dx E11.69   lisinopril (ZESTRIL) 10 MG tablet Take 1 tablet (10 mg total) by mouth daily.   metFORMIN (GLUCOPHAGE-XR) 500 MG 24 hr tablet Take 1 tablet (500 mg total) by mouth daily with breakfast.   nitroGLYCERIN (NITROSTAT) 0.4 MG SL tablet Place 1 tablet (0.4 mg total) under the tongue every 5 (five) minutes as needed for chest pain. May repeat up to 3 doses.   nystatin (MYCOSTATIN/NYSTOP) powder Apply 1 Application topically 3 (three) times daily.   Oxycodone HCl 10  MG TABS Take 1 tablet (10 mg total) by mouth in the morning.   Oxycodone HCl 10 MG TABS Take 1 tablet (10 mg total) by mouth daily as needed.   [START ON 04/25/2023] Oxycodone HCl 10 MG TABS Take 1 tablet (10 mg total) by mouth daily as needed.   Semaglutide,0.25 or 0.5MG /DOS, 2 MG/3ML SOPN Inject 0.25 mg into the skin once a week.   SYMBICORT 160-4.5 MCG/ACT inhaler INHALE 2 PUFFS TWICE DAILY   traZODone (DESYREL) 100 MG tablet Take 1 tablet (100 mg total) by mouth at bedtime.   TRUEplus Lancets 33G MISC Test BS up to 5 times daily Dx E11.69   [DISCONTINUED] doxycycline (  VIBRA-TABS) 100 MG tablet Take 1 tablet (100 mg total) by mouth 2 (two) times daily.   [DISCONTINUED] predniSONE (STERAPRED UNI-PAK 21 TAB) 10 MG (21) TBPK tablet Use as directed   No facility-administered encounter medications on file as of 04/02/2023.    Allergies  Allergen Reactions   Crestor [Rosuvastatin Calcium] Other (See Comments)    myalgias   Depakote [Divalproex Sodium]     Stomach pains   Lipitor [Atorvastatin] Other (See Comments)    Myalgias    Meclizine     Increased dizziness   Other Swelling    Mammelain meat allergy   Simvastatin Other (See Comments)    Myalgias    Statins Other (See Comments)    Myalgia    Topamax [Topiramate]     Back pain, tinnitus   Zonegran [Zonisamide]     Increased headache    Review of Systems  Constitutional:  Positive for activity change and chills. Negative for appetite change, diaphoresis, fatigue, fever, unexpected weight change and weight loss.  HENT:  Positive for congestion, postnasal drip and rhinorrhea. Negative for dental problem, drooling, ear discharge, ear pain, facial swelling, hearing loss, mouth sores, nosebleeds, sinus pressure, sinus pain, sneezing, sore throat, tinnitus, trouble swallowing and voice change.   Eyes: Negative.  Negative for photophobia and visual disturbance.  Respiratory:  Positive for cough, shortness of breath and wheezing.  Negative for apnea, hemoptysis, choking, chest tightness and stridor.   Cardiovascular:  Negative for chest pain, palpitations and leg swelling.  Gastrointestinal:  Negative for blood in stool, constipation, diarrhea, heartburn, nausea and vomiting.  Endocrine: Negative.   Genitourinary:  Negative for decreased urine volume, difficulty urinating, dysuria, frequency and urgency.  Musculoskeletal:  Negative for arthralgias and myalgias.  Skin: Negative.  Negative for rash.  Allergic/Immunologic: Negative.   Neurological:  Negative for dizziness, weakness and headaches.  Hematological: Negative.   Psychiatric/Behavioral:  Negative for confusion, hallucinations, sleep disturbance and suicidal ideas.   All other systems reviewed and are negative.       Objective:  BP 110/71   Pulse 97   Temp 97.6 F (36.4 C) (Temporal)   Ht 6\' 2"  (1.88 m)   Wt 270 lb 3.2 oz (122.6 kg)   SpO2 94%   BMI 34.69 kg/m    Wt Readings from Last 3 Encounters:  04/02/23 270 lb 3.2 oz (122.6 kg)  02/27/23 266 lb (120.7 kg)  11/25/22 256 lb (116.1 kg)    Physical Exam Vitals and nursing note reviewed.  Constitutional:      General: He is not in acute distress.    Appearance: Normal appearance. He is well-developed and well-groomed. He is not ill-appearing, toxic-appearing or diaphoretic.  HENT:     Head: Normocephalic and atraumatic.     Jaw: There is normal jaw occlusion.     Right Ear: Hearing, tympanic membrane, ear canal and external ear normal.     Left Ear: Hearing, tympanic membrane, ear canal and external ear normal.     Nose: Congestion present. No rhinorrhea.     Mouth/Throat:     Lips: Pink.     Mouth: Mucous membranes are moist.     Pharynx: Oropharynx is clear. Uvula midline. No posterior oropharyngeal erythema.  Eyes:     General: Lids are normal.     Extraocular Movements: Extraocular movements intact.     Conjunctiva/sclera: Conjunctivae normal.     Pupils: Pupils are equal, round,  and reactive to light.  Neck:  Thyroid: No thyroid mass, thyromegaly or thyroid tenderness.     Vascular: No carotid bruit or JVD.     Trachea: Trachea and phonation normal.  Cardiovascular:     Rate and Rhythm: Normal rate and regular rhythm.     Chest Wall: PMI is not displaced.     Pulses: Normal pulses.     Heart sounds: Normal heart sounds. No murmur heard.    No friction rub. No gallop.  Pulmonary:     Effort: Pulmonary effort is normal. No respiratory distress.     Breath sounds: No stridor. Wheezing and rhonchi (clears with cough) present. No rales.  Chest:     Chest wall: No tenderness.  Abdominal:     General: Bowel sounds are normal. There is no distension or abdominal bruit.     Palpations: Abdomen is soft. There is no hepatomegaly or splenomegaly.     Tenderness: There is no abdominal tenderness. There is no right CVA tenderness or left CVA tenderness.     Hernia: No hernia is present.  Musculoskeletal:        General: Normal range of motion.     Cervical back: Normal range of motion and neck supple.     Right lower leg: No edema.     Left lower leg: No edema.  Lymphadenopathy:     Cervical: No cervical adenopathy.  Skin:    General: Skin is warm and dry.     Capillary Refill: Capillary refill takes less than 2 seconds.     Coloration: Skin is not cyanotic, jaundiced or pale.     Findings: No rash.  Neurological:     General: No focal deficit present.     Mental Status: He is alert and oriented to person, place, and time.     Sensory: Sensation is intact.     Motor: Motor function is intact.     Coordination: Coordination is intact.     Gait: Gait is intact.     Deep Tendon Reflexes: Reflexes are normal and symmetric.  Psychiatric:        Attention and Perception: Attention and perception normal.        Mood and Affect: Mood and affect normal.        Speech: Speech normal.        Behavior: Behavior normal. Behavior is cooperative.        Thought Content:  Thought content normal.        Cognition and Memory: Cognition and memory normal.        Judgment: Judgment normal.     Results for orders placed or performed in visit on 12/20/22  HM DIABETES EYE EXAM  Result Value Ref Range   HM Diabetic Eye Exam Retinopathy (A) No Retinopathy       Pertinent labs & imaging results that were available during my care of the patient were reviewed by me and considered in my medical decision making.  Assessment & Plan:  Kyeon was seen today for cough and nasal congestion.  Diagnoses and all orders for this visit:  COPD exacerbation (HCC) Recently treated with doxycycline but did not get back to baseline. Worsening COPD symptoms, will treat with below. Pt aware to continue regular medications as prescribed. Aware to report new, worsening, or persistent symptoms. Aware of red flag that require emergent evaluation and treatment. Medications as prescribed.  -     amoxicillin-clavulanate (AUGMENTIN) 875-125 MG tablet; Take 1 tablet by mouth 2 (two) times daily for 10  days. -     predniSONE (DELTASONE) 20 MG tablet; Take 2 tablets (40 mg total) by mouth daily with breakfast for 5 days. 2 po daily for 5 days -     guaiFENesin (MUCINEX) 600 MG 12 hr tablet; Take 1 tablet (600 mg total) by mouth 2 (two) times daily for 14 days. -     azithromycin (ZITHROMAX Z-PAK) 250 MG tablet; As directed     Continue all other maintenance medications.  Follow up plan: Return in about 2 weeks (around 04/16/2023), or if symptoms worsen or fail to improve, for COPD.   Continue healthy lifestyle choices, including diet (rich in fruits, vegetables, and lean proteins, and low in salt and simple carbohydrates) and exercise (at least 30 minutes of moderate physical activity daily).   The above assessment and management plan was discussed with the patient. The patient verbalized understanding of and has agreed to the management plan. Patient is aware to call the clinic if they  develop any new symptoms or if symptoms persist or worsen. Patient is aware when to return to the clinic for a follow-up visit. Patient educated on when it is appropriate to go to the emergency department.   Kari Baars, FNP-C [facility] [phone]

## 2023-04-03 NOTE — Telephone Encounter (Signed)
Aware refill sent to pharmacy yesterday

## 2023-04-03 NOTE — Telephone Encounter (Signed)
Pt aware refill sent to pharmacy yesterday 

## 2023-05-07 ENCOUNTER — Other Ambulatory Visit: Payer: Self-pay | Admitting: Family

## 2023-05-07 DIAGNOSIS — F5101 Primary insomnia: Secondary | ICD-10-CM

## 2023-05-29 ENCOUNTER — Encounter: Payer: Self-pay | Admitting: Family

## 2023-05-30 ENCOUNTER — Ambulatory Visit (INDEPENDENT_AMBULATORY_CARE_PROVIDER_SITE_OTHER): Payer: BC Managed Care – PPO | Admitting: Family

## 2023-05-30 ENCOUNTER — Encounter: Payer: Self-pay | Admitting: Family

## 2023-05-30 VITALS — BP 123/73 | HR 96 | Temp 97.9°F | Ht 74.0 in | Wt 276.0 lb

## 2023-05-30 DIAGNOSIS — F1129 Opioid dependence with unspecified opioid-induced disorder: Secondary | ICD-10-CM | POA: Diagnosis not present

## 2023-05-30 DIAGNOSIS — Z23 Encounter for immunization: Secondary | ICD-10-CM

## 2023-05-30 DIAGNOSIS — M5442 Lumbago with sciatica, left side: Secondary | ICD-10-CM | POA: Diagnosis not present

## 2023-05-30 DIAGNOSIS — G8929 Other chronic pain: Secondary | ICD-10-CM

## 2023-05-30 DIAGNOSIS — E119 Type 2 diabetes mellitus without complications: Secondary | ICD-10-CM | POA: Diagnosis not present

## 2023-05-30 DIAGNOSIS — Z79899 Other long term (current) drug therapy: Secondary | ICD-10-CM

## 2023-05-30 DIAGNOSIS — I10 Essential (primary) hypertension: Secondary | ICD-10-CM

## 2023-05-30 DIAGNOSIS — Z0001 Encounter for general adult medical examination with abnormal findings: Secondary | ICD-10-CM

## 2023-05-30 DIAGNOSIS — Z122 Encounter for screening for malignant neoplasm of respiratory organs: Secondary | ICD-10-CM

## 2023-05-30 DIAGNOSIS — Z0000 Encounter for general adult medical examination without abnormal findings: Secondary | ICD-10-CM | POA: Diagnosis not present

## 2023-05-30 DIAGNOSIS — Z7984 Long term (current) use of oral hypoglycemic drugs: Secondary | ICD-10-CM

## 2023-05-30 DIAGNOSIS — E7800 Pure hypercholesterolemia, unspecified: Secondary | ICD-10-CM | POA: Diagnosis not present

## 2023-05-30 DIAGNOSIS — Z1211 Encounter for screening for malignant neoplasm of colon: Secondary | ICD-10-CM

## 2023-05-30 LAB — BAYER DCA HB A1C WAIVED: HB A1C (BAYER DCA - WAIVED): 6.6 % — ABNORMAL HIGH (ref 4.8–5.6)

## 2023-05-30 MED ORDER — EZETIMIBE 10 MG PO TABS
10.0000 mg | ORAL_TABLET | Freq: Every day | ORAL | 0 refills | Status: DC
Start: 2023-05-30 — End: 2023-10-06

## 2023-05-30 MED ORDER — OXYCODONE HCL 10 MG PO TABS: 10.0000 mg | ORAL_TABLET | Freq: Every day | ORAL | 0 refills | Status: DC | PRN

## 2023-05-30 MED ORDER — METFORMIN HCL ER 500 MG PO TB24: 500.0000 mg | ORAL_TABLET | Freq: Every day | ORAL | 2 refills | Status: DC

## 2023-05-30 MED ORDER — AMLODIPINE BESYLATE 5 MG PO TABS: 5.0000 mg | ORAL_TABLET | Freq: Every day | ORAL | 1 refills | Status: DC

## 2023-05-30 MED ORDER — OZEMPIC (0.25 OR 0.5 MG/DOSE) 2 MG/3ML ~~LOC~~ SOPN: 0.2500 mg | PEN_INJECTOR | SUBCUTANEOUS | 0 refills | Status: DC

## 2023-05-30 MED ORDER — OXYCODONE HCL 10 MG PO TABS
10.0000 mg | ORAL_TABLET | Freq: Every morning | ORAL | 0 refills | Status: DC
Start: 2023-05-30 — End: 2023-09-01

## 2023-05-30 NOTE — Patient Instructions (Signed)
Colorectal Cancer Screening  Colorectal cancer screening is a group of tests that are used to check for colorectal cancer before symptoms develop. Colorectal refers to the colon and rectum. The colon and rectum are located at the end of the digestive tract and carry stool (feces) out of the body. Who should have screening? All adults who are 45-58 years old should have screening. Your health care provider may recommend screening before age 45. You will have tests every 1-10 years, depending on your results and the type of screening test. Screening recommendations for adults who are 76-85 years old vary depending on a person's health. People older than age 85 should no longer get colorectal cancer screening. You may have screening tests starting before age 45, or more often than other people, if you have any of these risk factors: A personal or family history of colorectal cancer or abnormal growths known as polyps in your colon. Inflammatory bowel disease, such as ulcerative colitis or Crohn's disease. A history of having radiation treatment to the abdomen or the area between the hip bones (pelvic area) for cancer. A type of genetic syndrome that is passed from parent to child (hereditary), such as: Lynch syndrome. Familial adenomatous polyposis. Turcot syndrome. Peutz-Jeghers syndrome. MUTYH-associated polyposis (MAP). A personal history of diabetes. Types of tests There are several types of colorectal screening tests. You may have one or more of the following: Guaiac-based fecal occult blood testing. For this test, a stool sample is checked for hidden (occult) blood, which could be a sign of colorectal cancer. Fecal immunochemical test (FIT). For this test, a stool sample is checked for blood, which could be a sign of colorectal cancer. Stool DNA test. For this test, a stool sample is checked for blood and changes in DNA that could lead to colorectal cancer. Sigmoidoscopy. During this test, a  thin, flexible tube with a camera on the end, called a sigmoidoscope, is used to examine the rectum and the lower colon. Colonoscopy. During this test, a long, flexible tube with a camera on the end, called a colonoscope, is used to examine the entire colon and rectum. Also, sometimes a tissue sample is taken to be looked at under a microscope (biopsy) or small polyps are removed during this test. Virtual colonoscopy. Instead of a colonoscope, this type of colonoscopy uses a CT scan to take pictures of the colon and rectum. A CT scan is a type of X-ray that is made using computers. What are the benefits of screening? Screening reduces your risk for colorectal cancer and can help identify cancer at an early stage, when the cancer can be removed or treated more easily. It is common for polyps to form in the lining of the colon, especially as you age. These polyps may be cancerous or become cancerous over time. Screening can identify these polyps. What are the risks of screening? Generally, these are safe tests. However, problems may occur, including: The need for more tests to confirm results from a stool sample test. Stool sample tests have fewer risks than other types of screening tests. Being exposed to low levels of radiation, if you had a test involving X-rays. This may slightly increase your cancer risk. The benefit of detecting cancer outweighs the slight increase in risk. Bleeding, damage to the intestine, or infection caused by a sigmoidoscopy or colonoscopy. A reaction to medicines given during a sigmoidoscopy or colonoscopy. Talk with your health care provider to understand your risk for colorectal cancer and to make a   screening plan that is right for you. Questions to ask your health care provider When should I start colorectal cancer screening? What is my risk for colorectal cancer? How often do I need screening? Which screening tests do I need? How do I get my test results? What do my  results mean? Where to find more information Learn more about colorectal cancer screening from: The American Cancer Society: cancer.org National Cancer Institute: cancer.gov Summary Colorectal cancer screening is a group of tests used to check for colorectal cancer before symptoms develop. All adults who are 45-58 years old should have screening. Your health care provider may recommend screening before age 45. You may have screening tests starting before age 45, or more often than other people, if you have certain risk factors. Screening reduces your risk for colorectal cancer and can help identify cancer at an early stage, when the cancer can be removed or treated more easily. Talk with your health care provider to understand your risk for colorectal cancer and to make a screening plan that is right for you. This information is not intended to replace advice given to you by your health care provider. Make sure you discuss any questions you have with your health care provider. Document Revised: 10/27/2019 Document Reviewed: 10/27/2019 Elsevier Patient Education  2024 [employer]  

## 2023-05-30 NOTE — Progress Notes (Signed)
Subjective:    Patient ID: William Carson, male    DOB: 1966/07/20, 57 y.o.   MRN: [id number]  Chief Complaint  Patient presents with   Medical Management of Chronic Issues   PT presents to the office today for CPE and chronic follow up. PT was followed by Cardiologists annually for CAD.    He has COPD and states his breathing is stable. He states he quit smoking 08/25/22. He reports he taking Symbicort  BID. States his breathing is worse in the hot weather.  Hypertension This is a chronic problem. The current episode started more than 1 year ago. The problem has been resolved since onset. The problem is controlled. Associated symptoms include malaise/fatigue and shortness of breath. Pertinent negatives include no blurred vision or peripheral edema. Risk factors for coronary artery disease include dyslipidemia, diabetes mellitus, obesity, sedentary lifestyle and male gender. The current treatment provides moderate improvement.  Diabetes He presents for his follow-up diabetic visit. He has type 2 diabetes mellitus. Pertinent negatives for diabetes include no blurred vision and no foot paresthesias. Risk factors for coronary artery disease include dyslipidemia, diabetes mellitus, hypertension and sedentary lifestyle. He is following a generally healthy diet. His overall blood glucose range is 130-140 mg/dl. Eye exam is not current.  Hyperlipidemia This is a chronic problem. Exacerbating diseases include obesity. Associated symptoms include shortness of breath. Treatments tried: rephatha. The current treatment provides moderate improvement of lipids. Risk factors for coronary artery disease include dyslipidemia, diabetes mellitus, hypertension, male sex and a sedentary lifestyle.  Back Pain This is a chronic problem. The current episode started more than 1 year ago. The problem occurs intermittently. The problem has been waxing and waning since onset. The pain is present in the lumbar spine. The  quality of the pain is described as aching. The pain is at a severity of 7/10. The pain is moderate. Risk factors include obesity. He has tried analgesics and bed rest for the symptoms. The treatment provided moderate relief.  Insomnia Primary symptoms: sleep disturbance, difficulty falling asleep, malaise/fatigue.   The current episode started more than one year. The onset quality is gradual. Past treatments include medication. The treatment provided moderate relief.      Current opioids rx- Oxycodone 10 mg # meds rx- 30  Effectiveness of current meds-stable Adverse reactions from pain meds-none Morphine equivalent- 15   Pill count performed-No Last drug screen - 11/25/22 ( high risk q82m, moderate risk q76m, low risk yearly ) Urine drug screen today- Yes Was the NCCSR reviewed- yes             If yes were their any concerning findings? - none   Pain contract signed on: 11/25/22     Review of Systems  Constitutional:  Positive for malaise/fatigue.  Eyes:  Negative for blurred vision.  Respiratory:  Positive for shortness of breath.   Musculoskeletal:  Positive for back pain.  Psychiatric/Behavioral:  Positive for sleep disturbance. The patient has insomnia.   All other systems reviewed and are negative.  Family History  Problem Relation Age of Onset   Heart attack Mother        Infarction   Arrhythmia Father        Atrial fibrillation   Heart attack Maternal Grandfather    Heart attack Paternal Grandfather    Social History   Socioeconomic History   Marital status: Married    Spouse name: Not on file   Number of children: 0   Years of  education: 12   Highest education level: 12th grade  Occupational History   Occupation: Gaffer: OTHER    Comment: [place], [place]  Tobacco Use   Smoking status: Former    Current packs/day: 1.00    Average packs/day: 1 pack/day for 30.0 years (30.0 ttl pk-yrs)    Types: Cigarettes   Smokeless tobacco:  Former  Building services engineer status: Never Used  Substance and Sexual Activity   Alcohol use: Yes    Alcohol/week: 1.0 standard drink of alcohol    Types: 1 Standard drinks or equivalent per week   Drug use: No   Sexual activity: Yes  Other Topics Concern   Not on file  Social History Narrative   Lives with wife   Right handed    Caffeine use: Coffee every Sunday   Diet-sun drop daily or coke zero   Has been married twice   Married to current wife for 3 years   No children   Social Determinants of Health   Financial Resource Strain: Not on file  Food Insecurity: Not on file  Transportation Needs: Patient Declined (11/21/2022)   PRAPARE - Transportation    Lack of Transportation (Medical): Patient declined    Lack of Transportation (Non-Medical): Patient declined  Physical Activity: Unknown (11/21/2022)   Exercise Vital Sign    Days of Exercise per Week: 6 days    Minutes of Exercise per Session: Patient declined  Stress: No Stress Concern Present (11/21/2022)   [employer] of Occupational Health - Occupational Stress Questionnaire    Feeling of Stress : Not at all  Social Connections: Unknown (11/21/2022)   Social Connection and Isolation Panel [NHANES]    Frequency of Communication with Friends and Family: Not on file    Frequency of Social Gatherings with Friends and Family: Not on file    Attends Religious Services: Not on file    Active Member of Clubs or Organizations: Patient declined    Attends Banker Meetings: Not on file    Marital Status: Patient declined       Objective:   Physical Exam Vitals reviewed.  Constitutional:      General: He is not in acute distress.    Appearance: He is well-developed. He is obese.  HENT:     Head: Normocephalic.     Right Ear: Tympanic membrane normal.     Left Ear: Tympanic membrane normal.  Eyes:     General:        Right eye: No discharge.        Left eye: No discharge.     Pupils: Pupils are equal,  round, and reactive to light.  Neck:     Thyroid: No thyromegaly.  Cardiovascular:     Rate and Rhythm: Normal rate and regular rhythm.     Heart sounds: Normal heart sounds. No murmur heard. Pulmonary:     Effort: Pulmonary effort is normal. No respiratory distress.     Breath sounds: Rhonchi present. No wheezing.  Abdominal:     General: Bowel sounds are normal. There is no distension.     Palpations: Abdomen is soft.     Tenderness: There is no abdominal tenderness.  Musculoskeletal:        General: No tenderness. Normal range of motion.     Cervical back: Normal range of motion and neck supple.  Skin:    General: Skin is warm and dry.     Findings:  No erythema or rash.  Neurological:     Mental Status: He is alert and oriented to person, place, and time.     Cranial Nerves: No cranial nerve deficit.     Deep Tendon Reflexes: Reflexes are normal and symmetric.  Psychiatric:        Behavior: Behavior normal.        Thought Content: Thought content normal.        Judgment: Judgment normal.      BP 123/73   Pulse 96   Temp 97.9 F (36.6 C) (Temporal)   Ht 6\' 2"  (1.88 m)   Wt 276 lb (125.2 kg)   SpO2 95%   BMI 35.44 kg/m       Assessment & Plan:  DAWAUN PASE comes in today with chief complaint of Medical Management of Chronic Issues   Diagnosis and orders addressed:  1. Opioid dependence with opioid-induced disorder (HCC) - Oxycodone HCl 10 MG TABS; Take 1 tablet (10 mg total) by mouth in the morning.  Dispense: 30 tablet; Refill: 0 - Oxycodone HCl 10 MG TABS; Take 1 tablet (10 mg total) by mouth daily as needed.  Dispense: 30 tablet; Refill: 0 - Oxycodone HCl 10 MG TABS; Take 1 tablet (10 mg total) by mouth daily as needed.  Dispense: 30 tablet; Refill: 0 - CBC with Differential/Platelet - CMP14+EGFR  2. Controlled substance agreement signed - Oxycodone HCl 10 MG TABS; Take 1 tablet (10 mg total) by mouth in the morning.  Dispense: 30 tablet; Refill: 0 -  Oxycodone HCl 10 MG TABS; Take 1 tablet (10 mg total) by mouth daily as needed.  Dispense: 30 tablet; Refill: 0 - Oxycodone HCl 10 MG TABS; Take 1 tablet (10 mg total) by mouth daily as needed.  Dispense: 30 tablet; Refill: 0 - CBC with Differential/Platelet - CMP14+EGFR  3. Chronic left-sided low back pain with left-sided sciatica - Oxycodone HCl 10 MG TABS; Take 1 tablet (10 mg total) by mouth in the morning.  Dispense: 30 tablet; Refill: 0 - Oxycodone HCl 10 MG TABS; Take 1 tablet (10 mg total) by mouth daily as needed.  Dispense: 30 tablet; Refill: 0 - Oxycodone HCl 10 MG TABS; Take 1 tablet (10 mg total) by mouth daily as needed.  Dispense: 30 tablet; Refill: 0 - CBC with Differential/Platelet - CMP14+EGFR  4. Essential hypertension - amLODipine (NORVASC) 5 MG tablet; Take 1 tablet (5 mg total) by mouth daily.  Dispense: 90 tablet; Refill: 1 - CBC with Differential/Platelet - CMP14+EGFR  5. Pure hypercholesterolemia - ezetimibe (ZETIA) 10 MG tablet; Take 1 tablet (10 mg total) by mouth daily.  Dispense: 90 tablet; Refill: 0 - CBC with Differential/Platelet - CMP14+EGFR  6. Type 2 diabetes mellitus without complication, without long-term current use of insulin (HCC) - metFORMIN (GLUCOPHAGE-XR) 500 MG 24 hr tablet; Take 1 tablet (500 mg total) by mouth daily with breakfast.  Dispense: 90 tablet; Refill: 2 - Bayer DCA Hb A1c Waived - CBC with Differential/Platelet - CMP14+EGFR - Microalbumin / creatinine urine ratio  7. Encounter for immunization - Flu vaccine trivalent PF, 6mos and older(Flulaval,Afluria,Fluarix,Fluzone) - CBC with Differential/Platelet - CMP14+EGFR  8. Annual physical exam - Bayer DCA Hb A1c Waived - CBC with Differential/Platelet - Lipid panel - CMP14+EGFR - PSA, total and free  9. Screening for lung cancer - Ambulatory Referral Lung Cancer Screening [facility] - CBC with Differential/Platelet - CMP14+EGFR  10. Colon cancer screening  -  Ambulatory referral to Gastroenterology - CBC with  Differential/Platelet - CMP14+EGFR   Labs pending Patient reviewed in [place] controlled database, no flags noted. Contract and drug screen are up to date.  Continue current medications  Health Maintenance reviewed Diet and exercise encouraged  Follow up plan: 3 months    Jannifer Rodney, FNP

## 2023-05-31 LAB — CBC WITH DIFFERENTIAL/PLATELET
Basophils Absolute: 0.1 10*3/uL (ref 0.0–0.2)
Basos: 1 %
EOS (ABSOLUTE): 0.2 10*3/uL (ref 0.0–0.4)
Eos: 2 %
Hematocrit: 49.7 % (ref 37.5–51.0)
Hemoglobin: 16 g/dL (ref 13.0–17.7)
Immature Grans (Abs): 0.1 10*3/uL (ref 0.0–0.1)
Immature Granulocytes: 1 %
Lymphocytes Absolute: 2.6 10*3/uL (ref 0.7–3.1)
Lymphs: 27 %
MCH: 30 pg (ref 26.6–33.0)
MCHC: 32.2 g/dL (ref 31.5–35.7)
MCV: 93 fL (ref 79–97)
Monocytes Absolute: 1.2 10*3/uL — ABNORMAL HIGH (ref 0.1–0.9)
Monocytes: 12 %
Neutrophils Absolute: 5.7 10*3/uL (ref 1.4–7.0)
Neutrophils: 57 %
Platelets: 297 10*3/uL (ref 150–450)
RBC: 5.33 x10E6/uL (ref 4.14–5.80)
RDW: 12.7 % (ref 11.6–15.4)
WBC: 9.9 10*3/uL (ref 3.4–10.8)

## 2023-05-31 LAB — CMP14+EGFR
ALT: 50 [IU]/L — ABNORMAL HIGH (ref 0–44)
AST: 26 [IU]/L (ref 0–40)
Albumin: 4.6 g/dL (ref 3.8–4.9)
Alkaline Phosphatase: 38 [IU]/L — ABNORMAL LOW (ref 44–121)
BUN/Creatinine Ratio: 11 (ref 9–20)
BUN: 10 mg/dL (ref 6–24)
Bilirubin Total: 0.3 mg/dL (ref 0.0–1.2)
CO2: 21 mmol/L (ref 20–29)
Calcium: 9.7 mg/dL (ref 8.7–10.2)
Chloride: 106 mmol/L (ref 96–106)
Creatinine, Ser: 0.9 mg/dL (ref 0.76–1.27)
Globulin, Total: 2 g/dL (ref 1.5–4.5)
Glucose: 68 mg/dL — ABNORMAL LOW (ref 70–99)
Potassium: 4.4 mmol/L (ref 3.5–5.2)
Sodium: 145 mmol/L — ABNORMAL HIGH (ref 134–144)
Total Protein: 6.6 g/dL (ref 6.0–8.5)
eGFR: 99 mL/min/{1.73_m2} (ref >59)

## 2023-05-31 LAB — PSA, TOTAL AND FREE
PSA, Free Pct: 30 %
PSA, Free: 0.33 ng/mL
Prostate Specific Ag, Serum: 1.1 ng/mL (ref 0.0–4.0)

## 2023-05-31 LAB — LIPID PANEL
Chol/HDL Ratio: 2.1 ratio (ref 0.0–5.0)
Cholesterol, Total: 82 mg/dL — ABNORMAL LOW (ref 100–199)
HDL: 40 mg/dL (ref >39)
LDL Chol Calc (NIH): 25 mg/dL (ref 0–99)
Triglycerides: 82 mg/dL (ref 0–149)
VLDL Cholesterol Cal: 17 mg/dL (ref 5–40)

## 2023-05-31 LAB — MICROALBUMIN / CREATININE URINE RATIO
Creatinine, Urine: 103.1 mg/dL
Microalb/Creat Ratio: <3 mg/g{creat} (ref 0–29)
Microalbumin, Urine: <3 ug/mL

## 2023-06-03 ENCOUNTER — Encounter (INDEPENDENT_AMBULATORY_CARE_PROVIDER_SITE_OTHER): Payer: Self-pay | Admitting: *Deleted

## 2023-06-23 ENCOUNTER — Other Ambulatory Visit: Payer: Self-pay | Admitting: Family

## 2023-06-23 DIAGNOSIS — G720 Drug-induced myopathy: Secondary | ICD-10-CM

## 2023-06-23 DIAGNOSIS — I10 Essential (primary) hypertension: Secondary | ICD-10-CM

## 2023-06-23 DIAGNOSIS — Z789 Other specified health status: Secondary | ICD-10-CM

## 2023-06-23 DIAGNOSIS — I2510 Atherosclerotic heart disease of native coronary artery without angina pectoris: Secondary | ICD-10-CM

## 2023-06-23 DIAGNOSIS — E1169 Type 2 diabetes mellitus with other specified complication: Secondary | ICD-10-CM

## 2023-06-27 ENCOUNTER — Encounter: Payer: Self-pay | Admitting: *Deleted

## 2023-07-26 ENCOUNTER — Other Ambulatory Visit: Payer: Self-pay | Admitting: Family Medicine

## 2023-07-26 DIAGNOSIS — F5101 Primary insomnia: Secondary | ICD-10-CM

## 2023-08-06 ENCOUNTER — Other Ambulatory Visit: Payer: Self-pay | Admitting: Family

## 2023-08-06 DIAGNOSIS — J439 Emphysema, unspecified: Secondary | ICD-10-CM

## 2023-08-22 ENCOUNTER — Ambulatory Visit: Payer: BC Managed Care – PPO | Admitting: Cardiology

## 2023-08-25 NOTE — Telephone Encounter (Signed)
Copied from CRM [id number]. Topic: Clinical - Medication Question >> Aug 25, 2023  2:16 PM Geneva B wrote: Reason for CRM: patient wants to [facility] to call the pharmacy and get a rx refilled that another provider gave to himfor a rash please call patient back [phone]

## 2023-09-01 ENCOUNTER — Encounter: Payer: Self-pay | Admitting: Family

## 2023-09-01 ENCOUNTER — Ambulatory Visit (INDEPENDENT_AMBULATORY_CARE_PROVIDER_SITE_OTHER): Payer: BC Managed Care – PPO | Admitting: Family

## 2023-09-01 VITALS — BP 138/74 | HR 73 | Temp 97.5°F | Wt 277.8 lb

## 2023-09-01 DIAGNOSIS — M5442 Lumbago with sciatica, left side: Secondary | ICD-10-CM

## 2023-09-01 DIAGNOSIS — E785 Hyperlipidemia, unspecified: Secondary | ICD-10-CM

## 2023-09-01 DIAGNOSIS — T466X5A Adverse effect of antihyperlipidemic and antiarteriosclerotic drugs, initial encounter: Secondary | ICD-10-CM

## 2023-09-01 DIAGNOSIS — Z789 Other specified health status: Secondary | ICD-10-CM

## 2023-09-01 DIAGNOSIS — Z79899 Other long term (current) drug therapy: Secondary | ICD-10-CM

## 2023-09-01 DIAGNOSIS — B372 Candidiasis of skin and nail: Secondary | ICD-10-CM | POA: Diagnosis not present

## 2023-09-01 DIAGNOSIS — J438 Other emphysema: Secondary | ICD-10-CM | POA: Diagnosis not present

## 2023-09-01 DIAGNOSIS — F5101 Primary insomnia: Secondary | ICD-10-CM

## 2023-09-01 DIAGNOSIS — E1169 Type 2 diabetes mellitus with other specified complication: Secondary | ICD-10-CM

## 2023-09-01 DIAGNOSIS — F1129 Opioid dependence with unspecified opioid-induced disorder: Secondary | ICD-10-CM | POA: Diagnosis not present

## 2023-09-01 DIAGNOSIS — G720 Drug-induced myopathy: Secondary | ICD-10-CM

## 2023-09-01 DIAGNOSIS — I2510 Atherosclerotic heart disease of native coronary artery without angina pectoris: Secondary | ICD-10-CM

## 2023-09-01 DIAGNOSIS — G8929 Other chronic pain: Secondary | ICD-10-CM

## 2023-09-01 DIAGNOSIS — I10 Essential (primary) hypertension: Secondary | ICD-10-CM

## 2023-09-01 DIAGNOSIS — Z9861 Coronary angioplasty status: Secondary | ICD-10-CM

## 2023-09-01 DIAGNOSIS — E669 Obesity, unspecified: Secondary | ICD-10-CM

## 2023-09-01 MED ORDER — OXYCODONE HCL 10 MG PO TABS: 10.0000 mg | ORAL_TABLET | Freq: Every morning | ORAL | 0 refills | Status: DC

## 2023-09-01 MED ORDER — REPATHA SURECLICK 140 MG/ML ~~LOC~~ SOAJ: SUBCUTANEOUS | 4 refills | Status: DC

## 2023-09-01 MED ORDER — OXYCODONE HCL 10 MG PO TABS: 10.0000 mg | ORAL_TABLET | Freq: Every day | ORAL | 0 refills | Status: DC | PRN

## 2023-09-01 MED ORDER — NYSTATIN 100000 UNIT/GM EX POWD: 1.0000 | Freq: Three times a day (TID) | CUTANEOUS | 0 refills | Status: DC

## 2023-09-01 MED ORDER — OZEMPIC (0.25 OR 0.5 MG/DOSE) 2 MG/3ML ~~LOC~~ SOPN: 0.5000 mg | PEN_INJECTOR | SUBCUTANEOUS | 1 refills | Status: DC

## 2023-09-01 MED ORDER — TRIAMCINOLONE ACETONIDE 0.1 % EX CREA: 1.0000 | TOPICAL_CREAM | Freq: Two times a day (BID) | CUTANEOUS | 1 refills | Status: DC

## 2023-09-01 MED ORDER — SILVER SULFADIAZINE 1 % EX CREA: 1.0000 | TOPICAL_CREAM | Freq: Every day | CUTANEOUS | 0 refills | Status: DC

## 2023-09-01 NOTE — Progress Notes (Signed)
 Subjective:    Patient ID: William Carson, male    DOB: 17-Jan-1966, 58 y.o.   MRN: [id number]  Chief Complaint  Patient presents with   Follow-up    3 month follow up.   PT presents to the office today for chronic follow up. PT was followed by Cardiologists annually for CAD.    He has COPD and states his breathing is stable. He states he quit smoking 08/25/22. He reports he taking Symbicort   daily. Discussed he needs to take BID.  States his breathing is worse in the hot weather.  Hypertension This is a chronic problem. The current episode started more than 1 year ago. The problem has been resolved since onset. The problem is controlled. Associated symptoms include shortness of breath. Pertinent negatives include no blurred vision, malaise/fatigue or peripheral edema. Risk factors for coronary artery disease include dyslipidemia, diabetes mellitus, obesity, sedentary lifestyle and male gender. The current treatment provides moderate improvement.  Diabetes He presents for his follow-up diabetic visit. He has type 2 diabetes mellitus. Pertinent negatives for diabetes include no blurred vision and no foot paresthesias. Risk factors for coronary artery disease include dyslipidemia, diabetes mellitus, hypertension and sedentary lifestyle. He is following a generally healthy diet. His overall blood glucose range is 130-140 mg/dl. Eye exam is not current.  Hyperlipidemia This is a chronic problem. The current episode started more than 1 year ago. Exacerbating diseases include obesity. Associated symptoms include leg pain (some days) and shortness of breath. Treatments tried: rephatha. The current treatment provides moderate improvement of lipids. Risk factors for coronary artery disease include dyslipidemia, diabetes mellitus, hypertension, male sex and a sedentary lifestyle.  Back Pain This is a chronic problem. The current episode started more than 1 year ago. The problem occurs intermittently. The  problem has been waxing and waning since onset. The pain is present in the lumbar spine. The quality of the pain is described as aching. The pain is at a severity of 8/10. The pain is moderate. Associated symptoms include leg pain (some days). Risk factors include obesity. He has tried analgesics and bed rest for the symptoms. The treatment provided moderate relief.  Insomnia Primary symptoms: sleep disturbance, difficulty falling asleep, no malaise/fatigue.   The current episode started more than one year. The onset quality is gradual. Past treatments include medication. The treatment provided moderate relief.      Current opioids rx- Oxycodone  10 mg # meds rx- 30  Effectiveness of current meds-stable Adverse reactions from pain meds-none Morphine  equivalent- 15   Pill count performed-No Last drug screen - 11/25/22 ( high risk q51m, moderate risk q30m, low risk yearly ) Urine drug screen today- Yes Was the NCCSR reviewed- yes             If yes were their any concerning findings? - none   Pain contract signed on: 11/25/22     Review of Systems  Constitutional:  Negative for malaise/fatigue.  Eyes:  Negative for blurred vision.  Respiratory:  Positive for shortness of breath.   Musculoskeletal:  Positive for back pain.  Psychiatric/Behavioral:  Positive for sleep disturbance.   All other systems reviewed and are negative.  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[place], [place]  Tobacco Use   Smoking status: Former    Current packs/day:  1.00    Average packs/day: 1 pack/day for 30.0 years (30.0 ttl pk-yrs)    Types: Cigarettes   Smokeless tobacco: Former  Building services engineer status: Never Used  Substance and Sexual Activity   Alcohol use: Yes    Alcohol/week: 1.0 standard drink of alcohol    Types: 1 Standard drinks or equivalent per week   Drug use: No   Sexual activity: Yes  Other Topics Concern   Not on file  Social History Narrative   Lives with wife   Right handed    Caffeine use: Coffee every Sunday   Diet-sun drop daily or coke zero   Has been married twice   Married to current wife for 3 years   No children   Social Drivers of Corporate investment banker Strain: Not on file  Food Insecurity: Not on file  Transportation Needs: Patient Declined (11/21/2022)   PRAPARE - Transportation    Lack of Transportation (Medical): Patient declined    Lack of Transportation (Non-Medical): Patient declined  Physical Activity: Unknown (11/21/2022)   Exercise Vital Sign    Days of Exercise per Week: 6 days    Minutes of Exercise per Session: Patient declined  Stress: No Stress Concern Present (11/21/2022)   [employer] of Occupational Health - Occupational Stress Questionnaire    Feeling of Stress : Not at all  Social Connections: Unknown (11/21/2022)   Social Connection and Isolation Panel [NHANES]    Frequency of Communication with Friends and Family: Not on file    Frequency of Social Gatherings with Friends and Family: Not on file    Attends Religious Services: Not on file    Active Member of Clubs or Organizations: Patient declined    Attends Banker Meetings: Not on file    Marital Status: Patient declined       Objective:   Physical Exam Vitals reviewed.  Constitutional:      General: He is not in acute distress.    Appearance: He is well-developed. He is obese.  HENT:     Head: Normocephalic.     Right Ear: Tympanic membrane normal.     Left Ear: Tympanic membrane normal.  Eyes:      General:        Right eye: No discharge.        Left eye: No discharge.     Pupils: Pupils are equal, round, and reactive to light.  Neck:     Thyroid : No thyromegaly.  Cardiovascular:     Rate and Rhythm: Normal rate and regular rhythm.     Heart sounds: Normal heart sounds. No murmur heard. Pulmonary:     Effort: Pulmonary effort is normal. No respiratory distress.     Breath sounds: Rhonchi present. No wheezing.  Abdominal:     General: Bowel sounds are normal. There is no distension.     Palpations: Abdomen is soft.     Tenderness: There is no abdominal tenderness.  Musculoskeletal:        General: No tenderness. Normal range of motion.  Cervical back: Normal range of motion and neck supple.     Comments: Pain in lumbar with flexion   Skin:    General: Skin is warm and dry.     Findings: No erythema or rash.  Neurological:     Mental Status: He is alert and oriented to person, place, and time.     Cranial Nerves: No cranial nerve deficit.     Deep Tendon Reflexes: Reflexes are normal and symmetric.  Psychiatric:        Behavior: Behavior normal.        Thought Content: Thought content normal.        Judgment: Judgment normal.      BP 138/74   Pulse 73   Temp (!) 97.5 F (36.4 C)   Wt 277 lb 12.8 oz (126 kg)   SpO2 95%   BMI 35.67 kg/m       Assessment & Plan:  William Carson comes in today with chief complaint of Follow-up (3 month follow up.)   Diagnosis and orders addressed:  1. Candidal skin infection - nystatin  (MYCOSTATIN /NYSTOP ) powder; Apply 1 Application topically 3 (three) times daily.  Dispense: 15 g; Refill: 0  2. Opioid dependence with opioid-induced disorder (HCC) - Oxycodone  HCl 10 MG TABS; Take 1 tablet (10 mg total) by mouth in the morning.  Dispense: 30 tablet; Refill: 0 - Oxycodone  HCl 10 MG TABS; Take 1 tablet (10 mg total) by mouth daily as needed.  Dispense: 30 tablet; Refill: 0 - Oxycodone  HCl 10 MG TABS; Take 1 tablet (10  mg total) by mouth daily as needed.  Dispense: 30 tablet; Refill: 0  3. Controlled substance agreement signed - Oxycodone  HCl 10 MG TABS; Take 1 tablet (10 mg total) by mouth in the morning.  Dispense: 30 tablet; Refill: 0 - Oxycodone  HCl 10 MG TABS; Take 1 tablet (10 mg total) by mouth daily as needed.  Dispense: 30 tablet; Refill: 0 - Oxycodone  HCl 10 MG TABS; Take 1 tablet (10 mg total) by mouth daily as needed.  Dispense: 30 tablet; Refill: 0  4. Chronic left-sided low back pain with left-sided sciatica - Oxycodone  HCl 10 MG TABS; Take 1 tablet (10 mg total) by mouth in the morning.  Dispense: 30 tablet; Refill: 0 - Oxycodone  HCl 10 MG TABS; Take 1 tablet (10 mg total) by mouth daily as needed.  Dispense: 30 tablet; Refill: 0 - Oxycodone  HCl 10 MG TABS; Take 1 tablet (10 mg total) by mouth daily as needed.  Dispense: 30 tablet; Refill: 0  5. CAD S/P percutaneous coronary angioplasty - Evolocumab  (REPATHA  SURECLICK) 140 MG/ML SOAJ; INJECT 140 MG EVERY 2 WEEKS  Dispense: 2 mL; Refill: 4  6. Essential hypertension - Evolocumab  (REPATHA  SURECLICK) 140 MG/ML SOAJ; INJECT 140 MG EVERY 2 WEEKS  Dispense: 2 mL; Refill: 4  7. Hyperlipidemia associated with type 2 diabetes mellitus (HCC) - Evolocumab  (REPATHA  SURECLICK) 140 MG/ML SOAJ; INJECT 140 MG EVERY 2 WEEKS  Dispense: 2 mL; Refill: 4  8. Type 2 diabetes mellitus with other specified complication, without long-term current use of insulin  (HCC) - Evolocumab  (REPATHA  SURECLICK) 140 MG/ML SOAJ; INJECT 140 MG EVERY 2 WEEKS  Dispense: 2 mL; Refill: 4  9. Statin myopathy - Evolocumab  (REPATHA  SURECLICK) 140 MG/ML SOAJ; INJECT 140 MG EVERY 2 WEEKS  Dispense: 2 mL; Refill: 4  10. Statin intolerance - Evolocumab  (REPATHA  SURECLICK) 140 MG/ML SOAJ; INJECT 140 MG EVERY 2 WEEKS  Dispense: 2 mL; Refill: 4  11. Other  emphysema (HCC) (Primary)   12. Obesity (BMI 30-39.9)  13. Primary insomnia  Labs pending Patient reviewed in [place] controlled  database, no flags noted. Contract and drug screen are up to date.  Continue current medications  Health Maintenance reviewed Diet and exercise encouraged  Follow up plan: 3 months    Tommas Fragmin, FNP

## 2023-09-01 NOTE — Patient Instructions (Signed)
 COPD and Physical Activity Chronic obstructive pulmonary disease (COPD) is a long-term, or chronic, condition that affects the lungs. COPD is a general term that can be used to describe many problems that cause inflammation of the lungs and limit airflow. These conditions include chronic bronchitis and emphysema. The main symptom of COPD is shortness of breath, which makes it harder to do even simple tasks. This can also make it harder to exercise and stay active. Talk with your health care provider about treatments to help you breathe better and actions you can take to prevent breathing problems during physical activity. What are the benefits of exercising when you have COPD? Exercising regularly is an important part of a healthy lifestyle. You can still exercise and do physical activities even though you have COPD. Exercise and physical activity improve your shortness of breath by increasing blood flow (circulation). This causes your heart to pump more oxygen through your body. Moderate exercise can: Improve oxygen use. Increase your energy level. Help with shortness of breath. Strengthen your breathing muscles. Improve heart health. Help with sleep. Improve your self-esteem and feelings of self-worth. Lower depression, stress, and anxiety. Exercise can benefit everyone with COPD. The severity of your disease may affect how hard you can exercise, especially at first, but everyone can benefit. Talk with your health care provider about how much exercise is safe for you, and which activities and exercises are safe for you. What actions can I take to prevent breathing problems during physical activity? Sign up for a pulmonary rehabilitation program. This type of program may include: Education about lung diseases. Exercise classes that teach you how to exercise and be more active while improving your breathing. This usually involves: Exercise using your lower extremities, such as a stationary  bicycle. About 30 minutes of exercise, 2 to 5 times per week, for 6 to 12 weeks. Strength training, such as push-ups or leg lifts. Nutrition education. Group classes in which you can talk with others who also have COPD and learn ways to manage stress. If you use an oxygen tank, you should use it while you exercise. Work with your health care provider to adjust your oxygen for your physical activity. Your resting flow rate is different from your flow rate during physical activity. How to manage your breathing while exercising While you are exercising: Take slow breaths. Pace yourself, and do nottry to go too fast. Purse your lips while breathing out. Pursing your lips is similar to a kissing or whistling position. If doing exercise that uses a quick burst of effort, such as weight lifting: Breathe in before starting the exercise. Breathe out during the hardest part of the exercise, such as raising the weights. Where to find support You can find support for exercising with COPD from: Your health care provider. A pulmonary rehabilitation program. Your local health department or community health programs. Support groups, either online or in-person. Your health care provider may be able to recommend support groups. Where to find more information You can find more information about exercising with COPD from: American Lung Association: lung.org COPD Foundation: copdfoundation.org Contact a health care provider if: Your symptoms get worse. You have nausea. You have a fever. You want to start a new exercise program or a new activity. Get help right away if: You have chest pain. You cannot breathe. These symptoms may represent a serious problem that is an emergency. Do not wait to see if the symptoms will go away. Get medical help right away. Call  your local emergency services (911 in the [place] Do not drive yourself to the hospital. Summary COPD is a general term that can be used to describe  many different lung problems that cause lung inflammation and limit airflow. This includes chronic bronchitis and emphysema. Exercise and physical activity improve your shortness of breath by increasing blood flow (circulation). This causes your heart to provide more oxygen to your body. Contact your health care provider before starting any exercise program or new activity. Ask your health care provider what exercises and activities are safe for you. This information is not intended to replace advice given to you by your health care provider. Make sure you discuss any questions you have with your health care provider. Document Revised: 05/23/2023 Document Reviewed: 05/23/2023 Elsevier Patient Education  2024 [employer].

## 2023-09-11 NOTE — Progress Notes (Signed)
 Cardiology Office Note    Date:  09/13/2023  ID:  William Carson, William Carson 1966-05-19, MRN [id number] PCP:  Junie Spencer, FNP  Cardiologist:  Olga Millers, MD  Electrophysiologist:  None   Chief Complaint: Follow up for CAD   History of Present Illness: .    William Carson is a 58 y.o. male with visit-pertinent history of CAD, ruled in for subendocardial myocardial infarction's in 05/2010.  Cardiac catheterization performed revealed normal left main, 30% proximal LAD, normal LCx, 90% RCA.  He had 2 DES placed to the right coronary artery at that time.  He also has history of statin intolerance.  His last ischemic evaluation was in June 2018 with exercise treadmill that was normal.  He was last seen by Dr. Jens Som on 01/09/2022 for follow-up.  He remained stable from a cardiac standpoint.  He was restarted on aspirin 81 mg daily.  Today he presents for follow-up.  He reports that he has been doing well. He notes he has some baseline shortness of breath related to COPD, no significant change.  He denies any chest pain, lower extremity edema, orthopnea or PND.  He denies any palpitations, presyncope or syncope.  Currently runs a farm supply store, he notes that business is starting to pick up a little bit now that we are reaching the end of winter.  Labwork independently reviewed: 05/30/2023: Sodium 145, potassium 4.4, creatinine 0.9, AST 26, ALT 50, hemoglobin 16, hematocrit 49.7  ROS: .   Today he denies chest pain, lower extremity edema, fatigue, palpitations, melena, hematuria, hemoptysis, diaphoresis, weakness, presyncope, syncope, orthopnea, and PND.  All other systems are reviewed and otherwise negative. Studies Reviewed: Marland Kitchen    EKG:  EKG is ordered today, personally reviewed, demonstrating  EKG Interpretation Date/Time:  Friday September 12 2023 15:54:03 EST Ventricular Rate:  81 PR Interval:  170 QRS Duration:  90 QT Interval:  364 QTC Calculation: 422 R Axis:   85  Text  Interpretation: Normal sinus rhythm Normal ECG Confirmed by Reather Littler [phone]) on 09/13/2023 4:40:57 PM   CV Studies:  Cardiac Studies & Procedures   ______________________________________________________________________________________________   STRESS TESTS  EXERCISE TOLERANCE TEST (ETT) 01/16/2017  Narrative  Upsloping ST segment depression ST segment depression of 0.5 mm was noted during stress, and returning to baseline after 1-5 minutes of recovery. 1-1.5 upsloping ST depression II, III at 3 and 4 min recovery, Not significant for ischemia  Clinically negatvie Electrically nondiagnostic though PRP greater than 20,000. For HR achieved there were no significant ST changes            ______________________________________________________________________________________________        Current Reported Medications:.    Current Meds  Medication Sig   albuterol (VENTOLIN HFA) 108 (90 Base) MCG/ACT inhaler Inhale 2 puffs into the lungs every 6 (six) hours as needed for wheezing or shortness of breath.   amLODipine (NORVASC) 5 MG tablet Take 1 tablet (5 mg total) by mouth daily.   aspirin EC 81 MG tablet Take 1 tablet (81 mg total) by mouth daily. Swallow whole.   cetirizine (ZYRTEC) 10 MG tablet Take by mouth.   Evolocumab (REPATHA SURECLICK) 140 MG/ML SOAJ INJECT 140 MG EVERY 2 WEEKS   ezetimibe (ZETIA) 10 MG tablet Take 1 tablet (10 mg total) by mouth daily.   glucose blood (CONTOUR NEXT TEST) test strip Test BS up to 5 times daily Dx E11.69   lisinopril (ZESTRIL) 10 MG tablet Take 1 tablet (10 mg  total) by mouth daily.   metFORMIN (GLUCOPHAGE-XR) 500 MG 24 hr tablet Take 1 tablet (500 mg total) by mouth daily with breakfast.   nystatin (MYCOSTATIN/NYSTOP) powder Apply 1 Application topically 3 (three) times daily.   Oxycodone HCl 10 MG TABS Take 1 tablet (10 mg total) by mouth in the morning.   [START ON 09/26/2023] Oxycodone HCl 10 MG TABS Take 1 tablet (10 mg total) by mouth  daily as needed.   [START ON 10/28/2023] Oxycodone HCl 10 MG TABS Take 1 tablet (10 mg total) by mouth daily as needed.   Semaglutide,0.25 or 0.5MG /DOS, (OZEMPIC, 0.25 OR 0.5 MG/DOSE,) 2 MG/3ML SOPN Inject 0.5 mg into the skin once a week.   silver sulfADIAZINE (SSD) 1 % cream Apply 1 Application topically daily.   SYMBICORT 160-4.5 MCG/ACT inhaler INHALE 2 PUFFS TWICE DAILY   traZODone (DESYREL) 100 MG tablet TAKE ONE TABLET AT BEDTIME   triamcinolone cream (KENALOG) 0.1 % Apply 1 Application topically 2 (two) times daily.   TRUEplus Lancets 33G MISC Test BS up to 5 times daily Dx E11.69    Physical Exam:    VS:  BP 130/68   Pulse 89   Ht 6\' 2"  (1.88 m)   Wt 281 lb (127.5 kg)   SpO2 96%   BMI 36.08 kg/m    Wt Readings from Last 3 Encounters:  09/12/23 281 lb (127.5 kg)  09/01/23 277 lb 12.8 oz (126 kg)  05/30/23 276 lb (125.2 kg)    GEN: Well nourished, well developed in no acute distress NECK: No JVD; No carotid bruits CARDIAC: RRR, no murmurs, rubs, gallops RESPIRATORY:  Clear to auscultation without rales, wheezing or rhonchi  ABDOMEN: Soft, non-tender, non-distended EXTREMITIES:  No edema; No acute deformity   Asessement and Plan:.    CAD: S/p subendocardial MI in 05/2010.  Patient had 2 DES placed to the RCA at that time. Stable with no anginal symptoms. No indication for ischemic evaluation.  Patient notes that he does have some baseline shortness of breath related to his COPD. Heart healthy diet and regular cardiovascular exercise encouraged.  Reviewed ED precautions.  Continue amlodipine 5 mg daily, aspirin 81 mg daily, Repatha, Zetia 10 mg daily, lisinopril 10 mg daily.  Hypertension: Blood pressure today 130/68.  Continue current antihypertensive regimen.  Hyperlipidemia: Last lipid profile on 05/30/2023 indicated total cholesterol 82, HDL 40, triglycerides 82 and LDL 25.  Continue Repatha and Zetia 10 mg daily.  Monitor managed per PCP.  Tobacco use: Patient reports  that he stopped smoking on 08/25/2022.  Congratulated.    COPD: Patient notes that he does have history of COPD, he is currently on Symbicort.  Monitor managed per PCP.    Disposition: F/u with Dr. Jens Som in one year or sooner if needed.   Signed, Rip Harbour, NP

## 2023-09-12 ENCOUNTER — Ambulatory Visit: Payer: BC Managed Care – PPO | Attending: Cardiology | Admitting: Cardiology

## 2023-09-12 ENCOUNTER — Encounter: Payer: Self-pay | Admitting: Cardiology

## 2023-09-12 VITALS — BP 130/68 | HR 89 | Ht 74.0 in | Wt 281.0 lb

## 2023-09-12 DIAGNOSIS — Z720 Tobacco use: Secondary | ICD-10-CM

## 2023-09-12 DIAGNOSIS — Z9861 Coronary angioplasty status: Secondary | ICD-10-CM | POA: Diagnosis not present

## 2023-09-12 DIAGNOSIS — I2510 Atherosclerotic heart disease of native coronary artery without angina pectoris: Secondary | ICD-10-CM

## 2023-09-12 DIAGNOSIS — I10 Essential (primary) hypertension: Secondary | ICD-10-CM | POA: Diagnosis not present

## 2023-09-12 DIAGNOSIS — E7800 Pure hypercholesterolemia, unspecified: Secondary | ICD-10-CM | POA: Diagnosis not present

## 2023-09-12 DIAGNOSIS — J438 Other emphysema: Secondary | ICD-10-CM

## 2023-09-12 NOTE — Patient Instructions (Signed)
Medication Instructions:  No changes *If you need a refill on your cardiac medications before your next appointment, please call your pharmacy*   Lab Work: No labs If you have labs (blood work) drawn today and your tests are completely normal, you will receive your results only by: MyChart Message (if you have MyChart) OR A paper copy in the mail If you have any lab test that is abnormal or we need to change your treatment, we will call you to review the results.   Testing/Procedures: No testing   Follow-Up: At [facility], you and your health needs are our priority.  As part of our continuing mission to provide you with exceptional heart care, we have created designated Provider Care Teams.  These Care Teams include your primary Cardiologist (physician) and Advanced Practice Providers (APPs -  Physician Assistants and Nurse Practitioners) who all work together to provide you with the care you need, when you need it.  We recommend signing up for the patient portal called "MyChart".  Sign up information is provided on this After Visit Summary.  MyChart is used to connect with patients for Virtual Visits (Telemedicine).  Patients are able to view lab/test results, encounter notes, upcoming appointments, etc.  Non-urgent messages can be sent to your provider as well.   To learn more about what you can do with MyChart, go to [email or web address].    Your next appointment:   1 year(s)  Provider:    Olga Millers, MD

## 2023-09-13 ENCOUNTER — Encounter: Payer: Self-pay | Admitting: Cardiology

## 2023-10-06 ENCOUNTER — Other Ambulatory Visit: Payer: Self-pay | Admitting: Family

## 2023-10-06 DIAGNOSIS — E7800 Pure hypercholesterolemia, unspecified: Secondary | ICD-10-CM

## 2023-10-06 DIAGNOSIS — J439 Emphysema, unspecified: Secondary | ICD-10-CM

## 2023-10-17 ENCOUNTER — Other Ambulatory Visit: Payer: Self-pay | Admitting: Family

## 2023-10-17 DIAGNOSIS — F5101 Primary insomnia: Secondary | ICD-10-CM

## 2023-10-24 ENCOUNTER — Other Ambulatory Visit: Payer: Self-pay | Admitting: Family

## 2023-10-24 DIAGNOSIS — R058 Other specified cough: Secondary | ICD-10-CM

## 2023-10-24 DIAGNOSIS — R0602 Shortness of breath: Secondary | ICD-10-CM

## 2023-10-24 DIAGNOSIS — R0989 Other specified symptoms and signs involving the circulatory and respiratory systems: Secondary | ICD-10-CM

## 2023-10-28 DIAGNOSIS — H43823 Vitreomacular adhesion, bilateral: Secondary | ICD-10-CM | POA: Diagnosis not present

## 2023-10-28 DIAGNOSIS — H43393 Other vitreous opacities, bilateral: Secondary | ICD-10-CM | POA: Diagnosis not present

## 2023-10-28 DIAGNOSIS — E113291 Type 2 diabetes mellitus with mild nonproliferative diabetic retinopathy without macular edema, right eye: Secondary | ICD-10-CM | POA: Diagnosis not present

## 2023-10-28 DIAGNOSIS — H31093 Other chorioretinal scars, bilateral: Secondary | ICD-10-CM | POA: Diagnosis not present

## 2023-10-28 LAB — HM DIABETES EYE EXAM

## 2023-10-30 NOTE — Progress Notes (Signed)
 Order(s) created erroneously. Erroneous order ID: [id number]  Order moved by: CHART CORRECTION ANALYST, FIFTEEN  Order move date/time: 10/30/2023 2:49 PM  Source Patient: [id number]  Source Contact: 09/12/2023  Destination Patient: [id number]  Destination Contact: 01/01/2023  Erroneous order ID: [id number]  Order moved by: CHART CORRECTION ANALYST, FIFTEEN  Order move date/time: 10/30/2023 2:49 PM  Source Patient: [id number]  Source Contact: 09/12/2023  Destination Patient: [id number]  Destination Contact: 01/01/2023  Erroneous order ID: [id number]  Order moved by: CHART CORRECTION ANALYST, FIFTEEN  Order move date/time: 10/30/2023 2:49 PM  Source Patient: [id number]  Source Contact: 09/12/2023  Destination Patient: [id number]  Destination Contact: 01/01/2023  Erroneous order ID: [id number]  Order moved by: CHART CORRECTION ANALYST, FIFTEEN  Order move date/time: 10/30/2023 2:49 PM  Source Patient: [id number]  Source Contact: 09/12/2023  Destination Patient: [id number]  Destination Contact: 01/01/2023  Erroneous order ID: [id number]  Order moved by: CHART CORRECTION ANALYST, FIFTEEN  Order move date/time: 10/30/2023 2:49 PM  Source Patient: [id number]  Source Contact: 09/12/2023  Destination Patient:  [id number]  Destination Contact: 01/01/2023  Erroneous order ID: [id number]  Order moved by: CHART CORRECTION ANALYST, FIFTEEN  Order move date/time: 10/30/2023 2:49 PM  Source Patient: [id number]  Source Contact: 09/12/2023  Destination Patient: [id number]  Destination Contact: 01/01/2023

## 2023-11-22 ENCOUNTER — Other Ambulatory Visit: Payer: Self-pay | Admitting: Family

## 2023-11-22 DIAGNOSIS — I10 Essential (primary) hypertension: Secondary | ICD-10-CM

## 2023-11-22 DIAGNOSIS — J439 Emphysema, unspecified: Secondary | ICD-10-CM

## 2023-11-22 DIAGNOSIS — E119 Type 2 diabetes mellitus without complications: Secondary | ICD-10-CM

## 2023-11-24 ENCOUNTER — Encounter: Payer: Self-pay | Admitting: Family

## 2023-11-26 ENCOUNTER — Ambulatory Visit: Payer: Self-pay

## 2023-11-26 NOTE — Telephone Encounter (Signed)
 Chief Complaint: Boil Symptoms: red, pus-filled boil/cyst Frequency: 3 days Pertinent Negatives: Patient denies fever, chills Disposition: [] ED /[] Urgent Care (no appt availability in office) / [x] Appointment(In office/virtual)/ []  [facility]/ [] Home Care/ [] Refused Recommended Disposition /[] [facility]/ []  Follow-up with PCP Additional Notes: Patient called in stating he has had a cyst on his shin for 20 years, however; 3 days ago he bumped into something and the cyst turned into a red lump that is now putting out pus. Patient states it looks 'bad'. Patient is a diabetic and wants evaluation of this area. Patient appt made for tomorrow.    Copied from CRM [id number]. Topic: Clinical - Red Word Triage >> Nov 26, 2023  5:47 PM Tiffany H wrote: Red Word that prompted transfer to Nurse Triage: Patient called to advise that he has a spot on his leg that is filled with pus. He says he doesn't like the way it looks. Please assist. Reason for Disposition  [1] Boil > 1/2 inch across (> 12 mm; larger than a marble) AND [2] center is soft or pus colored  Answer Assessment - Initial Assessment Questions 1. APPEARANCE of BOIL: "What does the boil look like?"      Red with pus in it 2. LOCATION: "Where is the boil located?"      Side of shin of right leg 3. NUMBER: "How many boils are there?"      1 4. SIZE: "How big is the boil?" (e.g., inches, cm; compare to size of a coin or other object)     Red spot is half dollar 5. ONSET: "When did the boil start?"     3 days ago bumped it 6. PAIN: "Is there any pain?" If Yes, ask: "How bad is the pain?"   (Scale 1-10; or mild, moderate, severe)     Mild soreness 7. FEVER: "Do you have a fever?" If Yes, ask: "What is it, how was it measured, and when did it start?"      No 8. SOURCE: "Have you been around anyone with boils or other Staph infections?" "Have you ever had boils before?"     N/a 9. OTHER SYMPTOMS: "Do you have any other  symptoms?" (e.g., shaking chills, weakness, rash elsewhere on body)     No  Protocols used: Boil (Skin Abscess)-A-AH

## 2023-11-27 ENCOUNTER — Ambulatory Visit

## 2023-11-27 ENCOUNTER — Encounter: Payer: Self-pay | Admitting: Family Medicine

## 2023-11-27 VITALS — BP 121/72 | HR 69 | Temp 98.1°F | Ht 74.0 in | Wt 279.6 lb

## 2023-11-27 DIAGNOSIS — L0390 Cellulitis, unspecified: Secondary | ICD-10-CM | POA: Diagnosis not present

## 2023-11-27 DIAGNOSIS — E119 Type 2 diabetes mellitus without complications: Secondary | ICD-10-CM

## 2023-11-27 DIAGNOSIS — Z7984 Long term (current) use of oral hypoglycemic drugs: Secondary | ICD-10-CM | POA: Diagnosis not present

## 2023-11-27 MED ORDER — SULFAMETHOXAZOLE-TRIMETHOPRIM 800-160 MG PO TABS: 1.0000 | ORAL_TABLET | Freq: Two times a day (BID) | ORAL | 0 refills | Status: AC

## 2023-11-27 NOTE — Addendum Note (Signed)
 Addended by: Albertha Huger on: 11/27/2023 10:19 AM   Modules accepted: Level of Service

## 2023-11-27 NOTE — Telephone Encounter (Signed)
 Noted.

## 2023-11-27 NOTE — Progress Notes (Addendum)
   Acute Office Visit  Subjective:     Patient ID: William Carson, male    DOB: 10-20-1965, 58 y.o.   MRN: [id number]  Chief Complaint  Patient presents with   Wound Check    HPI Patient is in today for a wound check. He has a skin lesion on his right lower leg for years that has been seen by dermatology. 3 days ago he bumped this area on the step of his tractor. The area has been gotten red, tender, and warm. It was swollen until he squeezed it last night. It drainage a small amount of clear/bloody fluid. Denies fever or chills. He does have a hx of T2DM. Last A1c was 6.6 on 05/30/23.    ROS As per HPI.     Objective:    BP 121/72   Pulse 69   Temp 98.1 F (36.7 C) (Temporal)   Ht 6\' 2"  (1.88 m)   Wt 279 lb 9.6 oz (126.8 kg)   SpO2 96%   BMI 35.90 kg/m    Physical Exam Vitals and nursing note reviewed.  Constitutional:      General: He is not in acute distress.    Appearance: He is obese. He is not ill-appearing, toxic-appearing or diaphoretic.  Pulmonary:     Effort: Pulmonary effort is normal. No respiratory distress.  Musculoskeletal:     Right lower leg: No edema.     Left lower leg: No edema.  Skin:    General: Skin is warm and dry.     Comments: 2 cm pigmented skin lesion to right medial lower leg with small opening. Surround erythema, warmth, and tenderness. No induration, fluctuance, drainage, or exudate.   Neurological:     General: No focal deficit present.     Mental Status: He is alert and oriented to person, place, and time.  Psychiatric:        Mood and Affect: Mood normal.        Behavior: Behavior normal.     No results found for any visits on 11/27/23.      Assessment & Plan:   Tshawn was seen today for wound check.  Diagnoses and all orders for this visit:  Wound cellulitis Bactrim  as below. Area cleansed with saline and simple dressing applied today. Discussed home wound care. Return to office for new or worsening symptoms, or if  symptoms persist.  -     sulfamethoxazole -trimethoprim  (BACTRIM  DS) 800-160 MG tablet; Take 1 tablet by mouth 2 (two) times daily for 7 days.  Type 2 diabetes mellitus without complication, without long-term current use of insulin  (HCC) Controlled with last A1c at goal of <7. Bactrim  may increase concentration of metformin  however he is on a low dose of metformin .    Return if symptoms worsen or fail to improve.  The patient indicates understanding of these issues and agrees with the plan.  Albertha Huger, FNP

## 2023-12-01 ENCOUNTER — Encounter (INDEPENDENT_AMBULATORY_CARE_PROVIDER_SITE_OTHER): Payer: Self-pay | Admitting: *Deleted

## 2023-12-02 ENCOUNTER — Ambulatory Visit: Payer: BC Managed Care – PPO | Admitting: Family

## 2023-12-02 ENCOUNTER — Encounter: Payer: Self-pay | Admitting: Family

## 2023-12-02 VITALS — BP 116/68 | HR 88 | Temp 98.0°F | Ht 74.0 in | Wt 276.0 lb

## 2023-12-02 DIAGNOSIS — E559 Vitamin D deficiency, unspecified: Secondary | ICD-10-CM

## 2023-12-02 DIAGNOSIS — E7800 Pure hypercholesterolemia, unspecified: Secondary | ICD-10-CM | POA: Diagnosis not present

## 2023-12-02 DIAGNOSIS — I10 Essential (primary) hypertension: Secondary | ICD-10-CM

## 2023-12-02 DIAGNOSIS — E1169 Type 2 diabetes mellitus with other specified complication: Secondary | ICD-10-CM | POA: Diagnosis not present

## 2023-12-02 DIAGNOSIS — F5101 Primary insomnia: Secondary | ICD-10-CM

## 2023-12-02 DIAGNOSIS — F1129 Opioid dependence with unspecified opioid-induced disorder: Secondary | ICD-10-CM

## 2023-12-02 DIAGNOSIS — Z79899 Other long term (current) drug therapy: Secondary | ICD-10-CM | POA: Diagnosis not present

## 2023-12-02 DIAGNOSIS — J439 Emphysema, unspecified: Secondary | ICD-10-CM

## 2023-12-02 DIAGNOSIS — G8929 Other chronic pain: Secondary | ICD-10-CM

## 2023-12-02 DIAGNOSIS — B372 Candidiasis of skin and nail: Secondary | ICD-10-CM | POA: Diagnosis not present

## 2023-12-02 DIAGNOSIS — M5442 Lumbago with sciatica, left side: Secondary | ICD-10-CM

## 2023-12-02 DIAGNOSIS — Z789 Other specified health status: Secondary | ICD-10-CM

## 2023-12-02 DIAGNOSIS — E119 Type 2 diabetes mellitus without complications: Secondary | ICD-10-CM

## 2023-12-02 DIAGNOSIS — E785 Hyperlipidemia, unspecified: Secondary | ICD-10-CM

## 2023-12-02 MED ORDER — ALBUTEROL SULFATE HFA 108 (90 BASE) MCG/ACT IN AERS: 1.0000 | INHALATION_SPRAY | Freq: Four times a day (QID) | RESPIRATORY_TRACT | 2 refills | Status: AC | PRN

## 2023-12-02 MED ORDER — OXYCODONE HCL 10 MG PO TABS: 10.0000 mg | ORAL_TABLET | Freq: Every morning | ORAL | 0 refills | Status: DC

## 2023-12-02 MED ORDER — AMLODIPINE BESYLATE 5 MG PO TABS: 5.0000 mg | ORAL_TABLET | Freq: Every day | ORAL | 1 refills | Status: DC

## 2023-12-02 MED ORDER — SYMBICORT 160-4.5 MCG/ACT IN AERO: 2.0000 | INHALATION_SPRAY | Freq: Two times a day (BID) | RESPIRATORY_TRACT | 0 refills | Status: DC

## 2023-12-02 MED ORDER — METFORMIN HCL ER 500 MG PO TB24: 500.0000 mg | ORAL_TABLET | Freq: Every day | ORAL | 2 refills | Status: AC

## 2023-12-02 MED ORDER — NYSTATIN 100000 UNIT/GM EX POWD: 1.0000 | Freq: Three times a day (TID) | CUTANEOUS | 0 refills | Status: AC

## 2023-12-02 MED ORDER — TRAZODONE HCL 100 MG PO TABS: 100.0000 mg | ORAL_TABLET | Freq: Every day | ORAL | 0 refills | Status: DC

## 2023-12-02 MED ORDER — EZETIMIBE 10 MG PO TABS
10.0000 mg | ORAL_TABLET | Freq: Every day | ORAL | 0 refills | Status: DC
Start: 2023-12-02 — End: 2024-03-29

## 2023-12-02 MED ORDER — OXYCODONE HCL 10 MG PO TABS
10.0000 mg | ORAL_TABLET | Freq: Every day | ORAL | 0 refills | Status: DC | PRN
Start: 2024-01-30 — End: 2024-03-04

## 2023-12-02 MED ORDER — OXYCODONE HCL 10 MG PO TABS: 10.0000 mg | ORAL_TABLET | Freq: Every day | ORAL | 0 refills | Status: DC | PRN

## 2023-12-02 MED ORDER — LISINOPRIL 10 MG PO TABS: 10.0000 mg | ORAL_TABLET | Freq: Every day | ORAL | 1 refills | Status: DC

## 2023-12-02 NOTE — Patient Instructions (Signed)
 Health Maintenance, Male  Adopting a healthy lifestyle and getting preventive care are important in promoting health and wellness. Ask your health care provider about:  The right schedule for you to have regular tests and exams.  Things you can do on your own to prevent diseases and keep yourself healthy.  What should I know about diet, weight, and exercise?  Eat a healthy diet    Eat a diet that includes plenty of vegetables, fruits, low-fat dairy products, and lean protein.  Do not eat a lot of foods that are high in solid fats, added sugars, or sodium.  Maintain a healthy weight  Body mass index (BMI) is a measurement that can be used to identify possible weight problems. It estimates body fat based on height and weight. Your health care provider can help determine your BMI and help you achieve or maintain a healthy weight.  Get regular exercise  Get regular exercise. This is one of the most important things you can do for your health. Most adults should:  Exercise for at least 150 minutes each week. The exercise should increase your heart rate and make you sweat (moderate-intensity exercise).  Do strengthening exercises at least twice a week. This is in addition to the moderate-intensity exercise.  Spend less time sitting. Even light physical activity can be beneficial.  Watch cholesterol and blood lipids  Have your blood tested for lipids and cholesterol at 59 years of age, then have this test every 5 years.  You may need to have your cholesterol levels checked more often if:  Your lipid or cholesterol levels are high.  You are older than 59 years of age.  You are at high risk for heart disease.  What should I know about cancer screening?  Many types of cancers can be detected early and may often be prevented. Depending on your health history and family history, you may need to have cancer screening at various ages. This may include screening for:  Colorectal cancer.  Prostate cancer.  Skin cancer.  Lung  cancer.  What should I know about heart disease, diabetes, and high blood pressure?  Blood pressure and heart disease  High blood pressure causes heart disease and increases the risk of stroke. This is more likely to develop in people who have high blood pressure readings or are overweight.  Talk with your health care provider about your target blood pressure readings.  Have your blood pressure checked:  Every 3-5 years if you are 9-95 years of age.  Every year if you are 85 years old or older.  If you are between the ages of 29 and 29 and are a current or former smoker, ask your health care provider if you should have a one-time screening for abdominal aortic aneurysm (AAA).  Diabetes  Have regular diabetes screenings. This checks your fasting blood sugar level. Have the screening done:  Once every three years after age 23 if you are at a normal weight and have a low risk for diabetes.  More often and at a younger age if you are overweight or have a high risk for diabetes.  What should I know about preventing infection?  Hepatitis B  If you have a higher risk for hepatitis B, you should be screened for this virus. Talk with your health care provider to find out if you are at risk for hepatitis B infection.  Hepatitis C  Blood testing is recommended for:  Everyone born from [birth year] through 1966.  Anyone  with known risk factors for hepatitis C.  Sexually transmitted infections (STIs)  You should be screened each year for STIs, including gonorrhea and chlamydia, if:  You are sexually active and are younger than 59 years of age.  You are older than 59 years of age and your health care provider tells you that you are at risk for this type of infection.  Your sexual activity has changed since you were last screened, and you are at increased risk for chlamydia or gonorrhea. Ask your health care provider if you are at risk.  Ask your health care provider about whether you are at high risk for HIV. Your health care provider  may recommend a prescription medicine to help prevent HIV infection. If you choose to take medicine to prevent HIV, you should first get tested for HIV. You should then be tested every 3 months for as long as you are taking the medicine.  Follow these instructions at home:  Alcohol use  Do not drink alcohol if your health care provider tells you not to drink.  If you drink alcohol:  Limit how much you have to 0-2 drinks a day.  Know how much alcohol is in your drink. In the [place] one drink equals one 12 oz bottle of beer (355 mL), one 5 oz glass of wine (148 mL), or one 1 oz glass of hard liquor (44 mL).  Lifestyle  Do not use any products that contain nicotine or tobacco. These products include cigarettes, chewing tobacco, and vaping devices, such as e-cigarettes. If you need help quitting, ask your health care provider.  Do not use street drugs.  Do not share needles.  Ask your health care provider for help if you need support or information about quitting drugs.  General instructions  Schedule regular health, dental, and eye exams.  Stay current with your vaccines.  Tell your health care provider if:  You often feel depressed.  You have ever been abused or do not feel safe at home.  Summary  Adopting a healthy lifestyle and getting preventive care are important in promoting health and wellness.  Follow your health care provider's instructions about healthy diet, exercising, and getting tested or screened for diseases.  Follow your health care provider's instructions on monitoring your cholesterol and blood pressure.  This information is not intended to replace advice given to you by your health care provider. Make sure you discuss any questions you have with your health care provider.  Document Revised: 11/27/2020 Document Reviewed: 11/27/2020  Elsevier Patient Education  2024 [employer].

## 2023-12-02 NOTE — Progress Notes (Signed)
 Subjective:    Patient ID: William Carson, male    DOB: May 02, 1966, 58 y.o.   MRN: [id number]  Chief Complaint  Patient presents with   Medical Management of Chronic Issues   PT presents to the office today for chronic follow up. PT was followed by Cardiologists annually for CAD.    He has COPD and states his breathing is stable. He states he quit smoking 08/25/22. He reports he taking Symbicort   BID.  States his breathing is worse in the hot weather.  Hypertension This is a chronic problem. The current episode started more than 1 year ago. The problem has been resolved since onset. The problem is controlled. Associated symptoms include malaise/fatigue and shortness of breath. Pertinent negatives include no blurred vision or peripheral edema. Risk factors for coronary artery disease include dyslipidemia, diabetes mellitus, obesity, sedentary lifestyle and male gender. The current treatment provides moderate improvement.  Diabetes He presents for his follow-up diabetic visit. He has type 2 diabetes mellitus. Pertinent negatives for diabetes include no blurred vision and no foot paresthesias. Risk factors for coronary artery disease include dyslipidemia, diabetes mellitus, hypertension and sedentary lifestyle. He is following a generally healthy diet. His overall blood glucose range is 110-130 mg/dl. Eye exam is not current.  Hyperlipidemia This is a chronic problem. The current episode started more than 1 year ago. The problem is controlled. Recent lipid tests were reviewed and are normal. Exacerbating diseases include obesity. Associated symptoms include leg pain (some days) and shortness of breath. Treatments tried: rephatha. The current treatment provides moderate improvement of lipids. Risk factors for coronary artery disease include dyslipidemia, diabetes mellitus, hypertension, male sex and a sedentary lifestyle.  Back Pain This is a chronic problem. The current episode started more than 1  year ago. The problem occurs intermittently. The problem has been waxing and waning since onset. The pain is present in the lumbar spine. The quality of the pain is described as aching. The pain is at a severity of 9/10. The pain is moderate. Associated symptoms include leg pain (some days). Risk factors include obesity. He has tried analgesics and bed rest for the symptoms. The treatment provided moderate relief.  Insomnia Primary symptoms: sleep disturbance, difficulty falling asleep, malaise/fatigue.   The current episode started more than one year. The onset quality is gradual. Past treatments include medication. The treatment provided moderate relief.      Current opioids rx- Oxycodone  10 mg # meds rx- 30  Effectiveness of current meds-stable Adverse reactions from pain meds-none Morphine  equivalent- 15   Pill count performed-No Last drug screen - 12/02/23 ( high risk q39m, moderate risk q49m, low risk yearly ) Urine drug screen today- Yes Was the [facility] reviewed- yes             If yes were their any concerning findings? - none   Pain contract signed on: 12/02/23     Review of Systems  Constitutional:  Positive for malaise/fatigue.  Eyes:  Negative for blurred vision.  Respiratory:  Positive for shortness of breath.   Musculoskeletal:  Positive for back pain.  Psychiatric/Behavioral:  Positive for sleep disturbance.   All other systems reviewed and are negative.  Family History  Problem Relation Age of Onset   Heart attack Mother        Infarction   Arrhythmia Father        Atrial fibrillation   Heart attack Maternal Grandfather    Heart attack Paternal Grandfather    Social  History   Socioeconomic History   Marital status: Married    Spouse name: Not on file   Number of children: 0   Years of education: 22   Highest education level: 12th grade  Occupational History   Occupation: Gaffer: OTHER    Comment: [place], [place]  Tobacco Use    Smoking status: Former    Current packs/day: 1.00    Average packs/day: 1 pack/day for 30.0 years (30.0 ttl pk-yrs)    Types: Cigarettes   Smokeless tobacco: Former  Building services engineer status: Never Used  Substance and Sexual Activity   Alcohol use: Yes    Alcohol/week: 1.0 standard drink of alcohol    Types: 1 Standard drinks or equivalent per week   Drug use: No   Sexual activity: Yes  Other Topics Concern   Not on file  Social History Narrative   Lives with wife   Right handed    Caffeine use: Coffee every Sunday   Diet-sun drop daily or coke zero   Has been married twice   Married to current wife for 3 years   No children   Social Drivers of Corporate investment banker Strain: Not on file  Food Insecurity: Not on file  Transportation Needs: Patient Declined (11/21/2022)   PRAPARE - Transportation    Lack of Transportation (Medical): Patient declined    Lack of Transportation (Non-Medical): Patient declined  Physical Activity: Unknown (11/21/2022)   Exercise Vital Sign    Days of Exercise per Week: 6 days    Minutes of Exercise per Session: Patient declined  Stress: No Stress Concern Present (11/21/2022)   [employer] of Occupational Health - Occupational Stress Questionnaire    Feeling of Stress : Not at all  Social Connections: Unknown (11/21/2022)   Social Connection and Isolation Panel [NHANES]    Frequency of Communication with Friends and Family: Not on file    Frequency of Social Gatherings with Friends and Family: Not on file    Attends Religious Services: Not on file    Active Member of Clubs or Organizations: Patient declined    Attends Banker Meetings: Not on file    Marital Status: Patient declined       Objective:   Physical Exam Vitals reviewed.  Constitutional:      General: He is not in acute distress.    Appearance: He is well-developed. He is obese.  HENT:     Head: Normocephalic.     Right Ear: Tympanic membrane normal.      Left Ear: Tympanic membrane normal.  Eyes:     General:        Right eye: No discharge.        Left eye: No discharge.     Pupils: Pupils are equal, round, and reactive to light.  Neck:     Thyroid : No thyromegaly.  Cardiovascular:     Rate and Rhythm: Normal rate and regular rhythm.     Heart sounds: Normal heart sounds. No murmur heard. Pulmonary:     Effort: Pulmonary effort is normal. No respiratory distress.     Breath sounds: Rhonchi present. No wheezing.  Abdominal:     General: Bowel sounds are normal. There is no distension.     Palpations: Abdomen is soft.     Tenderness: There is no abdominal tenderness.  Musculoskeletal:        General: No tenderness. Normal range of motion.  Cervical back: Normal range of motion and neck supple.     Comments: Pain in lumbar with flexion   Skin:    General: Skin is warm and dry.     Findings: No erythema or rash.  Neurological:     Mental Status: He is alert and oriented to person, place, and time.     Cranial Nerves: No cranial nerve deficit.     Deep Tendon Reflexes: Reflexes are normal and symmetric.  Psychiatric:        Behavior: Behavior normal.        Thought Content: Thought content normal.        Judgment: Judgment normal.      BP 116/68   Pulse 88   Temp 98 F (36.7 C)   Ht 6\' 2"  (1.88 m)   Wt 276 lb (125.2 kg)   SpO2 94%   BMI 35.44 kg/m       Assessment & Plan:  JULIO STEPHANI comes in today with chief complaint of Medical Management of Chronic Issues   Diagnosis and orders addressed:  1. Essential hypertension - amLODipine  (NORVASC ) 5 MG tablet; Take 1 tablet (5 mg total) by mouth daily.  Dispense: 90 tablet; Refill: 1 - lisinopril  (ZESTRIL ) 10 MG tablet; Take 1 tablet (10 mg total) by mouth daily.  Dispense: 90 tablet; Refill: 1 - CMP14+EGFR  2. Pure hypercholesterolemia - ezetimibe  (ZETIA ) 10 MG tablet; Take 1 tablet (10 mg total) by mouth daily.  Dispense: 90 tablet; Refill: 0 -  CMP14+EGFR  3. Type 2 diabetes mellitus without complication, without long-term current use of insulin  (HCC) - lisinopril  (ZESTRIL ) 10 MG tablet; Take 1 tablet (10 mg total) by mouth daily.  Dispense: 90 tablet; Refill: 1 - metFORMIN  (GLUCOPHAGE -XR) 500 MG 24 hr tablet; Take 1 tablet (500 mg total) by mouth daily with breakfast.  Dispense: 90 tablet; Refill: 2 - Bayer DCA Hb A1c Waived - CMP14+EGFR  4. Candidal skin infection - nystatin  (MYCOSTATIN /NYSTOP ) powder; Apply 1 Application topically 3 (three) times daily.  Dispense: 15 g; Refill: 0 - CMP14+EGFR  5. Opioid dependence with opioid-induced disorder (HCC) - Oxycodone  HCl 10 MG TABS; Take 1 tablet (10 mg total) by mouth in the morning.  Dispense: 30 tablet; Refill: 0 - Oxycodone  HCl 10 MG TABS; Take 1 tablet (10 mg total) by mouth daily as needed.  Dispense: 30 tablet; Refill: 0 - Oxycodone  HCl 10 MG TABS; Take 1 tablet (10 mg total) by mouth daily as needed.  Dispense: 30 tablet; Refill: 0 - CMP14+EGFR  6. Controlled substance agreement signed  - Oxycodone  HCl 10 MG TABS; Take 1 tablet (10 mg total) by mouth in the morning.  Dispense: 30 tablet; Refill: 0 - Oxycodone  HCl 10 MG TABS; Take 1 tablet (10 mg total) by mouth daily as needed.  Dispense: 30 tablet; Refill: 0 - Oxycodone  HCl 10 MG TABS; Take 1 tablet (10 mg total) by mouth daily as needed.  Dispense: 30 tablet; Refill: 0 - ToxASSURE Select 13 (MW), Urine - CMP14+EGFR  7. Chronic left-sided low back pain with left-sided sciatica - Oxycodone  HCl 10 MG TABS; Take 1 tablet (10 mg total) by mouth in the morning.  Dispense: 30 tablet; Refill: 0 - Oxycodone  HCl 10 MG TABS; Take 1 tablet (10 mg total) by mouth daily as needed.  Dispense: 30 tablet; Refill: 0 - Oxycodone  HCl 10 MG TABS; Take 1 tablet (10 mg total) by mouth daily as needed.  Dispense: 30 tablet; Refill: 0 - ToxASSURE  Select 13 (MW), Urine - CMP14+EGFR  8. Pulmonary emphysema, unspecified emphysema type (HCC) -  albuterol  (VENTOLIN  HFA) 108 (90 Base) MCG/ACT inhaler; Inhale 1-2 puffs into the lungs every 6 (six) hours as needed for wheezing or shortness of breath.  Dispense: 8.5 g; Refill: 2 - SYMBICORT  160-4.5 MCG/ACT inhaler; Inhale 2 puffs into the lungs 2 (two) times daily.  Dispense: 10.2 g; Refill: 0 - CMP14+EGFR  9. Primary insomnia - traZODone  (DESYREL ) 100 MG tablet; Take 1 tablet (100 mg total) by mouth at bedtime.  Dispense: 90 tablet; Refill: 0 - CMP14+EGFR  10. Hyperlipidemia associated with type 2 diabetes mellitus (HCC) (Primary) - CMP14+EGFR  11. Vitamin D  deficiency - CMP14+EGFR  12. Statin intolerance - CMP14+EGFR   Labs pending Patient reviewed in [place] controlled database, no flags noted. Contract and drug screen are up to date.  Continue current medications  Health Maintenance reviewed Diet and exercise encouraged  Follow up plan: 3 months    Tommas Fragmin, FNP

## 2023-12-03 LAB — CMP14+EGFR
ALT: 82 IU/L — ABNORMAL HIGH (ref 0–44)
AST: 44 IU/L — ABNORMAL HIGH (ref 0–40)
Albumin: 4.4 g/dL (ref 3.8–4.9)
Alkaline Phosphatase: 46 IU/L (ref 44–121)
BUN/Creatinine Ratio: 10 (ref 9–20)
BUN: 10 mg/dL (ref 6–24)
Bilirubin Total: 0.4 mg/dL (ref 0.0–1.2)
CO2: 22 mmol/L (ref 20–29)
Calcium: 9.4 mg/dL (ref 8.7–10.2)
Chloride: 100 mmol/L (ref 96–106)
Creatinine, Ser: 0.99 mg/dL (ref 0.76–1.27)
Globulin, Total: 2 g/dL (ref 1.5–4.5)
Glucose: 197 mg/dL — ABNORMAL HIGH (ref 70–99)
Potassium: 4.4 mmol/L (ref 3.5–5.2)
Sodium: 137 mmol/L (ref 134–144)
Total Protein: 6.4 g/dL (ref 6.0–8.5)
eGFR: 88 mL/min/{1.73_m2} (ref >59)

## 2023-12-04 LAB — TOXASSURE SELECT 13 (MW), URINE

## 2023-12-05 ENCOUNTER — Ambulatory Visit: Payer: Self-pay | Admitting: Family

## 2023-12-26 ENCOUNTER — Encounter: Payer: Self-pay | Admitting: Family Medicine

## 2024-01-05 ENCOUNTER — Other Ambulatory Visit (HOSPITAL_COMMUNITY): Payer: Self-pay

## 2024-01-05 ENCOUNTER — Telehealth: Payer: Self-pay

## 2024-01-05 NOTE — Telephone Encounter (Signed)
 Pharmacy Patient Advocate Encounter   Received notification from CoverMyMeds that prior authorization for Repatha  Sureclick is required/requested.   Insurance verification completed.   The patient is insured through [facility] .   Per test claim: PA required; PA submitted to above mentioned insurance via CoverMyMeds Key/confirmation #/EOC [id number] Status is pending

## 2024-01-05 NOTE — Telephone Encounter (Signed)
 Pharmacy Patient Advocate Encounter  Received notification from [facility] that Prior Authorization for Repatha  SureClick 140MG /ML auto-injectors  has been APPROVED from 01/05/24 to 01/04/25. Ran test claim, Copay is $40. This test claim was processed through [facility]- copay amounts may vary at other pharmacies due to pharmacy/plan contracts, or as the patient moves through the different stages of their insurance plan.   PA #/Case ID/Reference #: [id number]

## 2024-02-19 ENCOUNTER — Other Ambulatory Visit: Payer: Self-pay | Admitting: Family

## 2024-02-19 DIAGNOSIS — J439 Emphysema, unspecified: Secondary | ICD-10-CM

## 2024-02-24 ENCOUNTER — Other Ambulatory Visit: Payer: Self-pay | Admitting: Family

## 2024-03-04 ENCOUNTER — Encounter: Payer: Self-pay | Admitting: Family

## 2024-03-04 ENCOUNTER — Ambulatory Visit: Admitting: Family

## 2024-03-04 VITALS — BP 142/84 | HR 85 | Temp 97.0°F | Ht 74.0 in | Wt 278.0 lb

## 2024-03-04 DIAGNOSIS — M5442 Lumbago with sciatica, left side: Secondary | ICD-10-CM

## 2024-03-04 DIAGNOSIS — E785 Hyperlipidemia, unspecified: Secondary | ICD-10-CM

## 2024-03-04 DIAGNOSIS — E1169 Type 2 diabetes mellitus with other specified complication: Secondary | ICD-10-CM | POA: Diagnosis not present

## 2024-03-04 DIAGNOSIS — I2510 Atherosclerotic heart disease of native coronary artery without angina pectoris: Secondary | ICD-10-CM

## 2024-03-04 DIAGNOSIS — Z79899 Other long term (current) drug therapy: Secondary | ICD-10-CM

## 2024-03-04 DIAGNOSIS — G720 Drug-induced myopathy: Secondary | ICD-10-CM

## 2024-03-04 DIAGNOSIS — Z23 Encounter for immunization: Secondary | ICD-10-CM

## 2024-03-04 DIAGNOSIS — J438 Other emphysema: Secondary | ICD-10-CM

## 2024-03-04 DIAGNOSIS — R7309 Other abnormal glucose: Secondary | ICD-10-CM | POA: Diagnosis not present

## 2024-03-04 DIAGNOSIS — F1129 Opioid dependence with unspecified opioid-induced disorder: Secondary | ICD-10-CM

## 2024-03-04 DIAGNOSIS — G8929 Other chronic pain: Secondary | ICD-10-CM

## 2024-03-04 DIAGNOSIS — F5101 Primary insomnia: Secondary | ICD-10-CM | POA: Diagnosis not present

## 2024-03-04 DIAGNOSIS — I10 Essential (primary) hypertension: Secondary | ICD-10-CM

## 2024-03-04 LAB — BAYER DCA HB A1C WAIVED: HB A1C (BAYER DCA - WAIVED): 7 % — ABNORMAL HIGH (ref 4.8–5.6)

## 2024-03-04 MED ORDER — OXYCODONE HCL 10 MG PO TABS: 10.0000 mg | ORAL_TABLET | Freq: Every day | ORAL | 0 refills | Status: DC | PRN

## 2024-03-04 MED ORDER — TIRZEPATIDE 2.5 MG/0.5ML ~~LOC~~ SOAJ: 2.5000 mg | SUBCUTANEOUS | 2 refills | Status: DC

## 2024-03-04 MED ORDER — OXYCODONE HCL 10 MG PO TABS: 10.0000 mg | ORAL_TABLET | Freq: Every morning | ORAL | 0 refills | Status: DC

## 2024-03-04 MED ORDER — TRAZODONE HCL 100 MG PO TABS: 100.0000 mg | ORAL_TABLET | Freq: Every day | ORAL | 0 refills | Status: DC

## 2024-03-04 NOTE — Progress Notes (Signed)
 Subjective:    Patient ID: William Carson, male    DOB: 1966/03/20, 58 y.o.   MRN: [id number]  Chief Complaint  Patient presents with   Medical Management of Chronic Issues   PT presents to the office today for chronic follow up.   PT was followed by Cardiologists annually for CAD.    He has COPD and states his breathing is stable. He states he quit smoking 08/25/22. He reports he taking Symbicort   BID.  States his breathing is worse in the hot weather.   He is morbid obese with a BMI of 35 with DM and HTN.  Hypertension This is a chronic problem. The current episode started more than 1 year ago. The problem has been waxing and waning since onset. The problem is uncontrolled. Associated symptoms include malaise/fatigue and shortness of breath. Pertinent negatives include no blurred vision or peripheral edema. Risk factors for coronary artery disease include dyslipidemia, diabetes mellitus, obesity, sedentary lifestyle and male gender. The current treatment provides moderate improvement.  Diabetes He presents for his follow-up diabetic visit. He has type 2 diabetes mellitus. Pertinent negatives for diabetes include no blurred vision and no foot paresthesias. Risk factors for coronary artery disease include dyslipidemia, diabetes mellitus, hypertension, sedentary lifestyle, male sex and obesity. He is following a generally healthy diet. His overall blood glucose range is 130-140 mg/dl. Eye exam is current.  Hyperlipidemia This is a chronic problem. The current episode started more than 1 year ago. The problem is controlled. Recent lipid tests were reviewed and are normal. Exacerbating diseases include obesity. Associated symptoms include leg pain (some days) and shortness of breath. Treatments tried: rephatha. The current treatment provides moderate improvement of lipids. Risk factors for coronary artery disease include dyslipidemia, diabetes mellitus, hypertension, male sex and a sedentary  lifestyle.  Back Pain This is a chronic problem. The current episode started more than 1 year ago. The problem occurs intermittently. The problem has been waxing and waning since onset. The pain is present in the lumbar spine. The quality of the pain is described as aching. The pain is at a severity of 8/10. The pain is moderate. The symptoms are aggravated by standing and twisting. Associated symptoms include leg pain (some days). Risk factors include obesity. He has tried analgesics and bed rest for the symptoms. The treatment provided moderate relief.  Insomnia Primary symptoms: sleep disturbance, difficulty falling asleep, malaise/fatigue.   The current episode started more than one year. The onset quality is gradual. Past treatments include medication. The treatment provided moderate relief.      Current opioids rx- Oxycodone  10 mg # meds rx- 30  Effectiveness of current meds-stable Adverse reactions from pain meds-none Morphine  equivalent- 15   Pill count performed-No Last drug screen - 12/02/23 ( high risk q18m, moderate risk q35m, low risk yearly ) Urine drug screen today- Yes Was the N[facility] reviewed- yes             If yes were their any concerning findings? - none   Pain contract signed on: 12/02/23     Review of Systems  Constitutional:  Positive for malaise/fatigue.  Eyes:  Negative for blurred vision.  Respiratory:  Positive for shortness of breath.   Musculoskeletal:  Positive for back pain.  Psychiatric/Behavioral:  Positive for sleep disturbance.   All other systems reviewed and are negative.  Family History  Problem Relation Age of Onset   Heart attack Mother        Infarction  Arrhythmia Father        Atrial fibrillation   Heart attack Maternal Grandfather    Heart attack Paternal Grandfather    Social History   Socioeconomic History   Marital status: Married    Spouse name: Not on file   Number of children: 0   Years of education: 12   Highest  education level: 12th grade  Occupational History   Occupation: Gaffer: OTHER    Comment: [place], [place]  Tobacco Use   Smoking status: Former    Current packs/day: 1.00    Average packs/day: 1 pack/day for 30.0 years (30.0 ttl pk-yrs)    Types: Cigarettes   Smokeless tobacco: Former  Building services engineer status: Never Used  Substance and Sexual Activity   Alcohol use: Yes    Alcohol/week: 1.0 standard drink of alcohol    Types: 1 Standard drinks or equivalent per week   Drug use: No   Sexual activity: Yes  Other Topics Concern   Not on file  Social History Narrative   Lives with wife   Right handed    Caffeine use: Coffee every Sunday   Diet-sun drop daily or coke zero   Has been married twice   Married to current wife for 3 years   No children   Social Drivers of Corporate investment banker Strain: Low Risk  (02/29/2024)   Overall Financial Resource Strain (CARDIA)    Difficulty of Paying Living Expenses: Not hard at all  Food Insecurity: No Food Insecurity (02/29/2024)   Hunger Vital Sign    Worried About Running Out of Food in the Last Year: Never true    Ran Out of Food in the Last Year: Never true  Transportation Needs: No Transportation Needs (02/29/2024)   PRAPARE - Administrator, Civil Service (Medical): No    Lack of Transportation (Non-Medical): No  Physical Activity: Unknown (02/29/2024)   Exercise Vital Sign    Days of Exercise per Week: 4 days    Minutes of Exercise per Session: Patient declined  Stress: No Stress Concern Present (02/29/2024)   [employer] of Occupational Health - Occupational Stress Questionnaire    Feeling of Stress: Not at all  Social Connections: Moderately Isolated (02/29/2024)   Social Connection and Isolation Panel    Frequency of Communication with Friends and Family: More than three times a week    Frequency of Social Gatherings with Friends and Family: More than three times a week     Attends Religious Services: Patient declined    Database administrator or Organizations: No    Attends Engineer, structural: Not on file    Marital Status: Married       Objective:   Physical Exam Vitals reviewed.  Constitutional:      General: He is not in acute distress.    Appearance: He is well-developed. He is obese.  HENT:     Head: Normocephalic.     Right Ear: Tympanic membrane normal.     Left Ear: Tympanic membrane normal.  Eyes:     General:        Right eye: No discharge.        Left eye: No discharge.     Pupils: Pupils are equal, round, and reactive to light.  Neck:     Thyroid : No thyromegaly.  Cardiovascular:     Rate and Rhythm: Normal rate and regular rhythm.  Heart sounds: Normal heart sounds. No murmur heard. Pulmonary:     Effort: Pulmonary effort is normal. No respiratory distress.     Breath sounds: Rhonchi present. No wheezing.  Abdominal:     General: Bowel sounds are normal. There is no distension.     Palpations: Abdomen is soft.     Tenderness: There is no abdominal tenderness.  Musculoskeletal:        General: No tenderness. Normal range of motion.     Cervical back: Normal range of motion and neck supple.     Comments: Pain in lumbar with flexion   Skin:    General: Skin is warm and dry.     Findings: No erythema or rash.  Neurological:     Mental Status: He is alert and oriented to person, place, and time.     Cranial Nerves: No cranial nerve deficit.     Deep Tendon Reflexes: Reflexes are normal and symmetric.  Psychiatric:        Behavior: Behavior normal.        Thought Content: Thought content normal.        Judgment: Judgment normal.      BP (!) 142/84   Pulse 85   Temp (!) 97 F (36.1 C) (Temporal)   Ht 6' 2 (1.88 m)   Wt 278 lb (126.1 kg)   SpO2 93%   BMI 35.69 kg/m       Assessment & Plan:  JAVAR ESHBACH comes in today with chief complaint of Medical Management of Chronic Issues   Diagnosis and  orders addressed:  1. Opioid dependence with opioid-induced disorder (HCC) - Oxycodone  HCl 10 MG TABS; Take 1 tablet (10 mg total) by mouth in the morning.  Dispense: 30 tablet; Refill: 0 - Oxycodone  HCl 10 MG TABS; Take 1 tablet (10 mg total) by mouth daily as needed.  Dispense: 30 tablet; Refill: 0 - Oxycodone  HCl 10 MG TABS; Take 1 tablet (10 mg total) by mouth daily as needed.  Dispense: 30 tablet; Refill: 0 - CMP14+EGFR  2. Controlled substance agreement signed - Oxycodone  HCl 10 MG TABS; Take 1 tablet (10 mg total) by mouth in the morning.  Dispense: 30 tablet; Refill: 0 - Oxycodone  HCl 10 MG TABS; Take 1 tablet (10 mg total) by mouth daily as needed.  Dispense: 30 tablet; Refill: 0 - Oxycodone  HCl 10 MG TABS; Take 1 tablet (10 mg total) by mouth daily as needed.  Dispense: 30 tablet; Refill: 0 - CMP14+EGFR  3. Chronic left-sided low back pain with left-sided sciatica - Oxycodone  HCl 10 MG TABS; Take 1 tablet (10 mg total) by mouth in the morning.  Dispense: 30 tablet; Refill: 0 - Oxycodone  HCl 10 MG TABS; Take 1 tablet (10 mg total) by mouth daily as needed.  Dispense: 30 tablet; Refill: 0 - Oxycodone  HCl 10 MG TABS; Take 1 tablet (10 mg total) by mouth daily as needed.  Dispense: 30 tablet; Refill: 0 - CMP14+EGFR  4. Primary insomnia - traZODone  (DESYREL ) 100 MG tablet; Take 1 tablet (100 mg total) by mouth at bedtime.  Dispense: 90 tablet; Refill: 0 - CMP14+EGFR  5. Immunization due - Pneumococcal conjugate vaccine 20-valent (Prevnar 20) - CMP14+EGFR  6. Hyperlipidemia associated with type 2 diabetes mellitus (HCC) - CMP14+EGFR  7. CAD S/P percutaneous coronary angioplasty - CMP14+EGFR  8. Other emphysema (HCC) - CMP14+EGFR  9. Morbid obesity (HCC) - CMP14+EGFR  10. Type 2 diabetes mellitus with other specified complication, without long-term current  use of insulin  (HCC) (Primary) - tirzepatide  (MOUNJARO ) 2.5 MG/0.5ML Pen; Inject 2.5 mg into the skin once a week.   Dispense: 2 mL; Refill: 2 - Bayer DCA Hb A1c Waived - CMP14+EGFR  11. Essential hypertension - CMP14+EGFR  12. Statin myopathy - CMP14+EGFR    Labs pending Will change Ozempic  to Mounjaro  2.5 mg related to dizziness.  Patient reviewed in [place] controlled database, no flags noted. Contract and drug screen are up to date.  Continue current medications  Health Maintenance reviewed Diet and exercise encouraged  Follow up plan: 3 months    Bari Learn, FNP

## 2024-03-04 NOTE — Patient Instructions (Signed)

## 2024-03-07 LAB — CMP14+EGFR

## 2024-03-08 ENCOUNTER — Ambulatory Visit: Payer: Self-pay | Admitting: Family

## 2024-03-08 LAB — CMP14+EGFR

## 2024-03-26 ENCOUNTER — Other Ambulatory Visit: Payer: Self-pay | Admitting: Family

## 2024-03-26 DIAGNOSIS — J439 Emphysema, unspecified: Secondary | ICD-10-CM

## 2024-03-29 ENCOUNTER — Other Ambulatory Visit: Payer: Self-pay | Admitting: Family

## 2024-03-29 DIAGNOSIS — E7800 Pure hypercholesterolemia, unspecified: Secondary | ICD-10-CM

## 2024-04-26 ENCOUNTER — Other Ambulatory Visit (HOSPITAL_COMMUNITY): Payer: Self-pay

## 2024-04-26 ENCOUNTER — Telehealth: Payer: Self-pay | Admitting: Family Medicine

## 2024-04-26 MED ORDER — TIRZEPATIDE 5 MG/0.5ML ~~LOC~~ SOAJ: 5.0000 mg | SUBCUTANEOUS | 2 refills | Status: DC

## 2024-04-26 NOTE — Telephone Encounter (Signed)
 Copied from CRM #[id number]. Topic: Clinical - Medication Prior Auth >> Apr 26, 2024 10:37 AM [name] wrote: Reason for CRM: Pt's preferred pharmacy, [facility] [place], [place] - [address] needs prior authorization to refill tirzepatide  (MOUNJARO ) 2.5 MG/0.5ML Pen. Pt also requested a call back from one of [name] nurses to discuss the need to prior authorization for this new med, as he states that he got prior authorization about a week ago and is unsure as to why pharmacy needs another one so soon. Call pt back at [phone] to discuss.

## 2024-04-26 NOTE — Telephone Encounter (Signed)
 Mounjaro  increased to 5 mg from 2.5 mg. PA approved for 5 mg.

## 2024-04-26 NOTE — Telephone Encounter (Signed)
 Per test claim, plan limits exceeded. Max 2 mls per 180 days. Patient must titrate up to the next dose each month. Ran test claim for 5mg , received a paid claim. Patient's copay is $25.

## 2024-04-26 NOTE — Telephone Encounter (Signed)
 Called patient every time he has to get it filled he needs a PA. Patient aware will route to the PA  Team to work on. Also patient would like to know why it needs a PA every time. Patient states if it is going to be like this every time he has to fill it he would just prefer to go back on the ozempic . Will route to pcp to let her know if it is still an issue he wants to change back.

## 2024-04-26 NOTE — Addendum Note (Signed)
 Addended by: LAVELL LYE A on: 04/26/2024 03:23 PM   Modules accepted: Orders

## 2024-04-26 NOTE — Telephone Encounter (Signed)
 Patient aware and verbalized understanding.

## 2024-05-17 ENCOUNTER — Other Ambulatory Visit: Payer: Self-pay | Admitting: Family

## 2024-05-17 DIAGNOSIS — I10 Essential (primary) hypertension: Secondary | ICD-10-CM

## 2024-05-17 DIAGNOSIS — I2510 Atherosclerotic heart disease of native coronary artery without angina pectoris: Secondary | ICD-10-CM

## 2024-05-17 DIAGNOSIS — Z789 Other specified health status: Secondary | ICD-10-CM

## 2024-05-17 DIAGNOSIS — E1169 Type 2 diabetes mellitus with other specified complication: Secondary | ICD-10-CM

## 2024-05-17 DIAGNOSIS — G720 Drug-induced myopathy: Secondary | ICD-10-CM

## 2024-06-03 ENCOUNTER — Other Ambulatory Visit: Payer: Self-pay | Admitting: *Deleted

## 2024-06-03 DIAGNOSIS — J439 Emphysema, unspecified: Secondary | ICD-10-CM

## 2024-06-04 ENCOUNTER — Encounter: Payer: Self-pay | Admitting: Family

## 2024-06-04 ENCOUNTER — Ambulatory Visit: Payer: Self-pay | Admitting: Family

## 2024-06-04 VITALS — BP 115/71 | HR 84 | Temp 97.8°F | Ht 74.0 in | Wt 275.8 lb

## 2024-06-04 DIAGNOSIS — J439 Emphysema, unspecified: Secondary | ICD-10-CM

## 2024-06-04 DIAGNOSIS — Z1211 Encounter for screening for malignant neoplasm of colon: Secondary | ICD-10-CM

## 2024-06-04 DIAGNOSIS — Z1159 Encounter for screening for other viral diseases: Secondary | ICD-10-CM

## 2024-06-04 DIAGNOSIS — G8929 Other chronic pain: Secondary | ICD-10-CM

## 2024-06-04 DIAGNOSIS — I10 Essential (primary) hypertension: Secondary | ICD-10-CM

## 2024-06-04 DIAGNOSIS — E119 Type 2 diabetes mellitus without complications: Secondary | ICD-10-CM

## 2024-06-04 DIAGNOSIS — F5101 Primary insomnia: Secondary | ICD-10-CM

## 2024-06-04 DIAGNOSIS — F1129 Opioid dependence with unspecified opioid-induced disorder: Secondary | ICD-10-CM | POA: Diagnosis not present

## 2024-06-04 DIAGNOSIS — E559 Vitamin D deficiency, unspecified: Secondary | ICD-10-CM

## 2024-06-04 DIAGNOSIS — E785 Hyperlipidemia, unspecified: Secondary | ICD-10-CM

## 2024-06-04 DIAGNOSIS — Z0000 Encounter for general adult medical examination without abnormal findings: Secondary | ICD-10-CM

## 2024-06-04 DIAGNOSIS — I2510 Atherosclerotic heart disease of native coronary artery without angina pectoris: Secondary | ICD-10-CM

## 2024-06-04 DIAGNOSIS — Z23 Encounter for immunization: Secondary | ICD-10-CM | POA: Diagnosis not present

## 2024-06-04 DIAGNOSIS — J438 Other emphysema: Secondary | ICD-10-CM

## 2024-06-04 DIAGNOSIS — M5442 Lumbago with sciatica, left side: Secondary | ICD-10-CM | POA: Diagnosis not present

## 2024-06-04 DIAGNOSIS — Z79899 Other long term (current) drug therapy: Secondary | ICD-10-CM

## 2024-06-04 DIAGNOSIS — Z0001 Encounter for general adult medical examination with abnormal findings: Secondary | ICD-10-CM

## 2024-06-04 DIAGNOSIS — E1169 Type 2 diabetes mellitus with other specified complication: Secondary | ICD-10-CM

## 2024-06-04 LAB — BAYER DCA HB A1C WAIVED: HB A1C (BAYER DCA - WAIVED): 6.9 % — ABNORMAL HIGH (ref 4.8–5.6)

## 2024-06-04 LAB — LIPID PANEL

## 2024-06-04 MED ORDER — SYMBICORT 160-4.5 MCG/ACT IN AERO: 2.0000 | INHALATION_SPRAY | Freq: Two times a day (BID) | RESPIRATORY_TRACT | 6 refills | Status: AC

## 2024-06-04 MED ORDER — TRAZODONE HCL 100 MG PO TABS: 100.0000 mg | ORAL_TABLET | Freq: Every day | ORAL | 0 refills | Status: AC

## 2024-06-04 MED ORDER — OXYCODONE HCL 10 MG PO TABS: 10.0000 mg | ORAL_TABLET | Freq: Every day | ORAL | 0 refills | Status: AC | PRN

## 2024-06-04 MED ORDER — OXYCODONE HCL 10 MG PO TABS: 10.0000 mg | ORAL_TABLET | Freq: Every morning | ORAL | 0 refills | Status: AC

## 2024-06-04 MED ORDER — OXYCODONE HCL 10 MG PO TABS
10.0000 mg | ORAL_TABLET | Freq: Every day | ORAL | 0 refills | Status: AC | PRN
Start: 2024-07-01 — End: ?

## 2024-06-04 MED ORDER — AMLODIPINE BESYLATE 5 MG PO TABS: 5.0000 mg | ORAL_TABLET | Freq: Every day | ORAL | 1 refills | Status: AC

## 2024-06-04 MED ORDER — LISINOPRIL 10 MG PO TABS: 10.0000 mg | ORAL_TABLET | Freq: Every day | ORAL | 1 refills | Status: AC

## 2024-06-04 MED ORDER — TIRZEPATIDE 7.5 MG/0.5ML ~~LOC~~ SOAJ: 7.5000 mg | SUBCUTANEOUS | 2 refills | Status: AC

## 2024-06-04 NOTE — Patient Instructions (Signed)
 Health Maintenance, Male  Adopting a healthy lifestyle and getting preventive care are important in promoting health and wellness. Ask your health care provider about:  The right schedule for you to have regular tests and exams.  Things you can do on your own to prevent diseases and keep yourself healthy.  What should I know about diet, weight, and exercise?  Eat a healthy diet    Eat a diet that includes plenty of vegetables, fruits, low-fat dairy products, and lean protein.  Do not eat a lot of foods that are high in solid fats, added sugars, or sodium.  Maintain a healthy weight  Body mass index (BMI) is a measurement that can be used to identify possible weight problems. It estimates body fat based on height and weight. Your health care provider can help determine your BMI and help you achieve or maintain a healthy weight.  Get regular exercise  Get regular exercise. This is one of the most important things you can do for your health. Most adults should:  Exercise for at least 150 minutes each week. The exercise should increase your heart rate and make you sweat (moderate-intensity exercise).  Do strengthening exercises at least twice a week. This is in addition to the moderate-intensity exercise.  Spend less time sitting. Even light physical activity can be beneficial.  Watch cholesterol and blood lipids  Have your blood tested for lipids and cholesterol at 59 years of age, then have this test every 5 years.  You may need to have your cholesterol levels checked more often if:  Your lipid or cholesterol levels are high.  You are older than 59 years of age.  You are at high risk for heart disease.  What should I know about cancer screening?  Many types of cancers can be detected early and may often be prevented. Depending on your health history and family history, you may need to have cancer screening at various ages. This may include screening for:  Colorectal cancer.  Prostate cancer.  Skin cancer.  Lung  cancer.  What should I know about heart disease, diabetes, and high blood pressure?  Blood pressure and heart disease  High blood pressure causes heart disease and increases the risk of stroke. This is more likely to develop in people who have high blood pressure readings or are overweight.  Talk with your health care provider about your target blood pressure readings.  Have your blood pressure checked:  Every 3-5 years if you are 24-52 years of age.  Every year if you are 3 years old or older.  If you are between the ages of 60 and 72 and are a current or former smoker, ask your health care provider if you should have a one-time screening for abdominal aortic aneurysm (AAA).  Diabetes  Have regular diabetes screenings. This checks your fasting blood sugar level. Have the screening done:  Once every three years after age 66 if you are at a normal weight and have a low risk for diabetes.  More often and at a younger age if you are overweight or have a high risk for diabetes.  What should I know about preventing infection?  Hepatitis B  If you have a higher risk for hepatitis B, you should be screened for this virus. Talk with your health care provider to find out if you are at risk for hepatitis B infection.  Hepatitis C  Blood testing is recommended for:  Everyone born from [birth year] through 1966.  Anyone  with known risk factors for hepatitis C.  Sexually transmitted infections (STIs)  You should be screened each year for STIs, including gonorrhea and chlamydia, if:  You are sexually active and are younger than 59 years of age.  You are older than 59 years of age and your health care provider tells you that you are at risk for this type of infection.  Your sexual activity has changed since you were last screened, and you are at increased risk for chlamydia or gonorrhea. Ask your health care provider if you are at risk.  Ask your health care provider about whether you are at high risk for HIV. Your health care provider  may recommend a prescription medicine to help prevent HIV infection. If you choose to take medicine to prevent HIV, you should first get tested for HIV. You should then be tested every 3 months for as long as you are taking the medicine.  Follow these instructions at home:  Alcohol use  Do not drink alcohol if your health care provider tells you not to drink.  If you drink alcohol:  Limit how much you have to 0-2 drinks a day.  Know how much alcohol is in your drink. In the [place] one drink equals one 12 oz bottle of beer (355 mL), one 5 oz glass of wine (148 mL), or one 1 oz glass of hard liquor (44 mL).  Lifestyle  Do not use any products that contain nicotine or tobacco. These products include cigarettes, chewing tobacco, and vaping devices, such as e-cigarettes. If you need help quitting, ask your health care provider.  Do not use street drugs.  Do not share needles.  Ask your health care provider for help if you need support or information about quitting drugs.  General instructions  Schedule regular health, dental, and eye exams.  Stay current with your vaccines.  Tell your health care provider if:  You often feel depressed.  You have ever been abused or do not feel safe at home.  Summary  Adopting a healthy lifestyle and getting preventive care are important in promoting health and wellness.  Follow your health care provider's instructions about healthy diet, exercising, and getting tested or screened for diseases.  Follow your health care provider's instructions on monitoring your cholesterol and blood pressure.  This information is not intended to replace advice given to you by your health care provider. Make sure you discuss any questions you have with your health care provider.  Document Revised: 11/27/2020 Document Reviewed: 11/27/2020  Elsevier Patient Education  2024 [employer].

## 2024-06-04 NOTE — Progress Notes (Signed)
 Subjective:    Patient ID: William Carson, male    DOB: February 25, 1966, 58 y.o.   MRN: [id number]  Chief Complaint  Patient presents with   Medical Management of Chronic Issues   PT presents to the office today for CPE.   PT was followed by Cardiologists annually for CAD.    He has COPD and states his breathing is stable. He states he quit smoking 08/25/22. He reports he taking Symbicort   BID.  States his breathing is worse in the hot weather.   He is morbid obese with a BMI of 35 with DM and HTN.  Hypertension This is a chronic problem. The current episode started more than 1 year ago. The problem has been resolved since onset. The problem is controlled. Associated symptoms include malaise/fatigue and shortness of breath. Pertinent negatives include no blurred vision or peripheral edema. Risk factors for coronary artery disease include dyslipidemia, diabetes mellitus, obesity, sedentary lifestyle and male gender. The current treatment provides moderate improvement.  Diabetes He presents for his follow-up diabetic visit. He has type 2 diabetes mellitus. Pertinent negatives for diabetes include no blurred vision and no foot paresthesias. Risk factors for coronary artery disease include dyslipidemia, diabetes mellitus, hypertension, sedentary lifestyle, male sex and obesity. He is following a generally healthy diet. His overall blood glucose range is 130-140 mg/dl. Eye exam is current.  Hyperlipidemia This is a chronic problem. The current episode started more than 1 year ago. The problem is controlled. Recent lipid tests were reviewed and are normal. Exacerbating diseases include obesity. Associated symptoms include leg pain (some days) and shortness of breath. Treatments tried: rephatha. The current treatment provides moderate improvement of lipids. Risk factors for coronary artery disease include dyslipidemia, diabetes mellitus, hypertension, male sex, a sedentary lifestyle and obesity.  Back  Pain This is a chronic problem. The current episode started more than 1 year ago. The problem occurs intermittently. The problem has been waxing and waning since onset. The pain is present in the lumbar spine. The quality of the pain is described as aching. The pain is at a severity of 9/10. The pain is moderate. The symptoms are aggravated by standing and twisting. Associated symptoms include leg pain (some days). Risk factors include obesity. He has tried analgesics and bed rest for the symptoms. The treatment provided moderate relief.  Insomnia Primary symptoms: sleep disturbance, difficulty falling asleep, malaise/fatigue.   The current episode started more than one year. The onset quality is gradual. Past treatments include medication. The treatment provided moderate relief.      Current opioids rx- Oxycodone  10 mg # meds rx- 30  Effectiveness of current meds-stable Adverse reactions from pain meds-none Morphine  equivalent- 15   Pill count performed-No Last drug screen - 12/02/23 ( high risk q20m, moderate risk q4m, low risk yearly ) Urine drug screen today- Yes Was the [facility] reviewed- yes             If yes were their any concerning findings? - none   Pain contract signed on: 12/02/23     Review of Systems  Constitutional:  Positive for malaise/fatigue.  Eyes:  Negative for blurred vision.  Respiratory:  Positive for shortness of breath.   Musculoskeletal:  Positive for back pain.  Psychiatric/Behavioral:  Positive for sleep disturbance.   All other systems reviewed and are negative.  Family History  Problem Relation Age of Onset   Heart attack Mother        Infarction   Arrhythmia Father  Atrial fibrillation   Heart attack Maternal Grandfather    Heart attack Paternal Grandfather    Social History   Socioeconomic History   Marital status: Married    Spouse name: Not on file   Number of children: 0   Years of education: 12   Highest education level: 12th  grade  Occupational History   Occupation: Gaffer: OTHER    Comment: [place], [place]  Tobacco Use   Smoking status: Former    Current packs/day: 1.00    Average packs/day: 1 pack/day for 30.0 years (30.0 ttl pk-yrs)    Types: Cigarettes   Smokeless tobacco: Former  Building Services Engineer status: Never Used  Substance and Sexual Activity   Alcohol use: Yes    Alcohol/week: 1.0 standard drink of alcohol    Types: 1 Standard drinks or equivalent per week   Drug use: No   Sexual activity: Yes  Other Topics Concern   Not on file  Social History Narrative   Lives with wife   Right handed    Caffeine use: Coffee every Sunday   Diet-sun drop daily or coke zero   Has been married twice   Married to current wife for 3 years   No children   Social Drivers of Corporate Investment Banker Strain: Low Risk  (02/29/2024)   Overall Financial Resource Strain (CARDIA)    Difficulty of Paying Living Expenses: Not hard at all  Food Insecurity: No Food Insecurity (02/29/2024)   Hunger Vital Sign    Worried About Running Out of Food in the Last Year: Never true    Ran Out of Food in the Last Year: Never true  Transportation Needs: No Transportation Needs (02/29/2024)   PRAPARE - Administrator, Civil Service (Medical): No    Lack of Transportation (Non-Medical): No  Physical Activity: Unknown (02/29/2024)   Exercise Vital Sign    Days of Exercise per Week: 4 days    Minutes of Exercise per Session: Patient declined  Stress: No Stress Concern Present (02/29/2024)   [employer] of Occupational Health - Occupational Stress Questionnaire    Feeling of Stress: Not at all  Social Connections: Moderately Isolated (02/29/2024)   Social Connection and Isolation Panel    Frequency of Communication with Friends and Family: More than three times a week    Frequency of Social Gatherings with Friends and Family: More than three times a week    Attends Religious  Services: Patient declined    Database Administrator or Organizations: No    Attends Engineer, Structural: Not on file    Marital Status: Married       Objective:   Physical Exam Vitals reviewed.  Constitutional:      General: He is not in acute distress.    Appearance: He is well-developed. He is obese.  HENT:     Head: Normocephalic.     Right Ear: Tympanic membrane normal.     Left Ear: Tympanic membrane normal.  Eyes:     General:        Right eye: No discharge.        Left eye: No discharge.     Pupils: Pupils are equal, round, and reactive to light.  Neck:     Thyroid : No thyromegaly.  Cardiovascular:     Rate and Rhythm: Normal rate and regular rhythm.     Heart sounds: Normal heart sounds. No murmur  heard. Pulmonary:     Effort: Pulmonary effort is normal. No respiratory distress.     Breath sounds: Rhonchi present. No wheezing.  Abdominal:     General: Bowel sounds are normal. There is no distension.     Palpations: Abdomen is soft.     Tenderness: There is no abdominal tenderness.  Musculoskeletal:        General: No tenderness. Normal range of motion.     Cervical back: Normal range of motion and neck supple.     Comments: Pain in lumbar with flexion   Skin:    General: Skin is warm and dry.     Findings: No erythema or rash.  Neurological:     Mental Status: He is alert and oriented to person, place, and time.     Cranial Nerves: No cranial nerve deficit.     Deep Tendon Reflexes: Reflexes are normal and symmetric.  Psychiatric:        Behavior: Behavior normal.        Thought Content: Thought content normal.        Judgment: Judgment normal.      BP 115/71   Pulse 84   Temp 97.8 F (36.6 C) (Temporal)   Ht 6' 2 (1.88 m)   Wt 275 lb 12.8 oz (125.1 kg)   BMI 35.41 kg/m       Assessment & Plan:  William Carson comes in today with chief complaint of Medical Management of Chronic Issues   Diagnosis and orders addressed:  1.  Essential hypertension - amLODipine  (NORVASC ) 5 MG tablet; Take 1 tablet (5 mg total) by mouth daily.  Dispense: 90 tablet; Refill: 1 - lisinopril  (ZESTRIL ) 10 MG tablet; Take 1 tablet (10 mg total) by mouth daily.  Dispense: 90 tablet; Refill: 1 - CMP14+EGFR - CBC with Differential/Platelet - TSH  2. Type 2 diabetes mellitus without complication, without long-term current use of insulin  (HCC) - lisinopril  (ZESTRIL ) 10 MG tablet; Take 1 tablet (10 mg total) by mouth daily.  Dispense: 90 tablet; Refill: 1 - Microalbumin / creatinine urine ratio - Bayer DCA Hb A1c Waived - CMP14+EGFR - CBC with Differential/Platelet - TSH - Vitamin B12 - tirzepatide  (MOUNJARO ) 7.5 MG/0.5ML Pen; Inject 7.5 mg into the skin once a week.  Dispense: 6 mL; Refill: 2  3. Opioid dependence with opioid-induced disorder (HCC)  - Oxycodone  HCl 10 MG TABS; Take 1 tablet (10 mg total) by mouth in the morning.  Dispense: 30 tablet; Refill: 0 - Oxycodone  HCl 10 MG TABS; Take 1 tablet (10 mg total) by mouth daily as needed.  Dispense: 30 tablet; Refill: 0 - Oxycodone  HCl 10 MG TABS; Take 1 tablet (10 mg total) by mouth daily as needed.  Dispense: 30 tablet; Refill: 0 - CMP14+EGFR - CBC with Differential/Platelet  4. Controlled substance agreement signed - Oxycodone  HCl 10 MG TABS; Take 1 tablet (10 mg total) by mouth in the morning.  Dispense: 30 tablet; Refill: 0 - Oxycodone  HCl 10 MG TABS; Take 1 tablet (10 mg total) by mouth daily as needed.  Dispense: 30 tablet; Refill: 0 - Oxycodone  HCl 10 MG TABS; Take 1 tablet (10 mg total) by mouth daily as needed.  Dispense: 30 tablet; Refill: 0 - CMP14+EGFR - CBC with Differential/Platelet  5. Chronic left-sided low back pain with left-sided sciatica  - Oxycodone  HCl 10 MG TABS; Take 1 tablet (10 mg total) by mouth in the morning.  Dispense: 30 tablet; Refill: 0 - Oxycodone  HCl 10  MG TABS; Take 1 tablet (10 mg total) by mouth daily as needed.  Dispense: 30 tablet; Refill:  0 - Oxycodone  HCl 10 MG TABS; Take 1 tablet (10 mg total) by mouth daily as needed.  Dispense: 30 tablet; Refill: 0 - CMP14+EGFR - CBC with Differential/Platelet  6. Primary insomnia  - traZODone  (DESYREL ) 100 MG tablet; Take 1 tablet (100 mg total) by mouth at bedtime.  Dispense: 90 tablet; Refill: 0 - CMP14+EGFR - CBC with Differential/Platelet  7. Encounter for immunization - Flu vaccine trivalent PF, 6mos and older(Flulaval,Afluria,Fluarix,Fluzone) - CMP14+EGFR - CBC with Differential/Platelet  8. Annual physical exam (Primary)  - Microalbumin / creatinine urine ratio - Bayer DCA Hb A1c Waived - CMP14+EGFR - CBC with Differential/Platelet - Lipid panel - PSA, total and free - TSH - Vitamin B12  9. Vitamin D  deficiency  - CMP14+EGFR - CBC with Differential/Platelet  10. Morbid obesity (HCC)  - CMP14+EGFR - CBC with Differential/Platelet  11. Hyperlipidemia associated with type 2 diabetes mellitus (HCC) - CMP14+EGFR - CBC with Differential/Platelet - Lipid panel  12. Other emphysema (HCC)  - CMP14+EGFR - CBC with Differential/Platelet  13. CAD S/P percutaneous coronary angioplasty - CMP14+EGFR - CBC with Differential/Platelet  14. Need for hepatitis B screening test  - Hepatitis B surface antibody,quantitative  15. Colon cancer screening - Ambulatory referral to Gastroenterology   Labs pending Patient reviewed in [place] controlled database, no flags noted. Contract and drug screen are up to date.  Will increase Mounjaro  to 7.5 mg from 5 mg Continue current medications  Health Maintenance reviewed Diet and exercise encouraged  Follow up plan: 3 months    Bari Learn, FNP

## 2024-06-05 LAB — CBC WITH DIFFERENTIAL/PLATELET
Basophils Absolute: 0.1 x10E3/uL (ref 0.0–0.2)
Basos: 1 %
EOS (ABSOLUTE): 0.2 x10E3/uL (ref 0.0–0.4)
Eos: 2 %
Hematocrit: 47.2 % (ref 37.5–51.0)
Hemoglobin: 15.9 g/dL (ref 13.0–17.7)
Immature Grans (Abs): 0 x10E3/uL (ref 0.0–0.1)
Immature Granulocytes: 0 %
Lymphocytes Absolute: 2.7 x10E3/uL (ref 0.7–3.1)
Lymphs: 26 %
MCH: 31.1 pg (ref 26.6–33.0)
MCHC: 33.7 g/dL (ref 31.5–35.7)
MCV: 92 fL (ref 79–97)
Monocytes Absolute: 1.3 x10E3/uL — ABNORMAL HIGH (ref 0.1–0.9)
Monocytes: 12 %
Neutrophils Absolute: 6.1 x10E3/uL (ref 1.4–7.0)
Neutrophils: 59 %
Platelets: 333 x10E3/uL (ref 150–450)
RBC: 5.12 x10E6/uL (ref 4.14–5.80)
RDW: 13 % (ref 11.6–15.4)
WBC: 10.5 x10E3/uL (ref 3.4–10.8)

## 2024-06-05 LAB — HEPATITIS B SURFACE ANTIBODY, QUANTITATIVE: Hepatitis B Surf Ab Quant: <3.5 m[IU]/mL — ABNORMAL LOW

## 2024-06-05 LAB — MICROALBUMIN / CREATININE URINE RATIO
Creatinine, Urine: 215.5 mg/dL
Microalb/Creat Ratio: 4 mg/g{creat} (ref 0–29)
Microalbumin, Urine: 7.6 ug/mL

## 2024-06-05 LAB — PSA, TOTAL AND FREE
PSA, Free Pct: 24.2 %
PSA, Free: 0.46 ng/mL
Prostate Specific Ag, Serum: 1.9 ng/mL (ref 0.0–4.0)

## 2024-06-05 LAB — LIPID PANEL
Chol/HDL Ratio: 2.1 ratio (ref 0.0–5.0)
Cholesterol, Total: 72 mg/dL — ABNORMAL LOW (ref 100–199)
HDL: 35 mg/dL — ABNORMAL LOW (ref >39)
LDL Chol Calc (NIH): 21 mg/dL (ref 0–99)
Triglycerides: 73 mg/dL (ref 0–149)
VLDL Cholesterol Cal: 16 mg/dL (ref 5–40)

## 2024-06-05 LAB — CMP14+EGFR
ALT: 62 IU/L — ABNORMAL HIGH (ref 0–44)
AST: 34 IU/L (ref 0–40)
Albumin: 4.6 g/dL (ref 3.8–4.9)
Alkaline Phosphatase: 43 IU/L — ABNORMAL LOW (ref 47–123)
BUN/Creatinine Ratio: 13 (ref 9–20)
BUN: 12 mg/dL (ref 6–24)
Bilirubin Total: 0.3 mg/dL (ref 0.0–1.2)
CO2: 19 mmol/L — ABNORMAL LOW (ref 20–29)
Calcium: 9.8 mg/dL (ref 8.7–10.2)
Chloride: 105 mmol/L (ref 96–106)
Creatinine, Ser: 0.89 mg/dL (ref 0.76–1.27)
Globulin, Total: 1.9 g/dL (ref 1.5–4.5)
Glucose: 67 mg/dL — ABNORMAL LOW (ref 70–99)
Potassium: 4.4 mmol/L (ref 3.5–5.2)
Sodium: 139 mmol/L (ref 134–144)
Total Protein: 6.5 g/dL (ref 6.0–8.5)
eGFR: 99 mL/min/1.73 (ref >59)

## 2024-06-05 LAB — VITAMIN B12: Vitamin B-12: 536 pg/mL (ref 232–1245)

## 2024-06-05 LAB — TSH: TSH: 2.16 u[IU]/mL (ref 0.450–4.500)

## 2024-06-07 ENCOUNTER — Ambulatory Visit: Payer: Self-pay | Admitting: Family

## 2024-06-08 ENCOUNTER — Ambulatory Visit

## 2024-06-14 ENCOUNTER — Other Ambulatory Visit: Payer: Self-pay | Admitting: Family

## 2024-06-14 DIAGNOSIS — I2510 Atherosclerotic heart disease of native coronary artery without angina pectoris: Secondary | ICD-10-CM

## 2024-06-14 DIAGNOSIS — G720 Drug-induced myopathy: Secondary | ICD-10-CM

## 2024-06-14 DIAGNOSIS — E1169 Type 2 diabetes mellitus with other specified complication: Secondary | ICD-10-CM

## 2024-06-14 DIAGNOSIS — I10 Essential (primary) hypertension: Secondary | ICD-10-CM

## 2024-06-14 DIAGNOSIS — Z789 Other specified health status: Secondary | ICD-10-CM

## 2024-06-29 ENCOUNTER — Other Ambulatory Visit: Payer: Self-pay | Admitting: Family

## 2024-06-29 DIAGNOSIS — E7800 Pure hypercholesterolemia, unspecified: Secondary | ICD-10-CM

## 2024-07-23 ENCOUNTER — Other Ambulatory Visit: Payer: Self-pay | Admitting: Family

## 2024-08-10 ENCOUNTER — Encounter (INDEPENDENT_AMBULATORY_CARE_PROVIDER_SITE_OTHER): Payer: Self-pay | Admitting: *Deleted

## 2024-08-25 ENCOUNTER — Telehealth: Payer: Self-pay

## 2024-08-25 NOTE — Telephone Encounter (Signed)
 Copied from CRM #[id number]. Topic: Clinical - Medical Advice >> Aug 25, 2024  9:21 AM Miquel SAILOR wrote: Reason for CRM: tirzepatide  (MOUNJARO ) 7.5 MG/0.5ML Pen-PT stated does not want to take this anymore due to Symptoms: nauseated/vomitting /diarrhea. Will like call back on alternative medication . [phone]

## 2024-08-27 ENCOUNTER — Telehealth: Payer: Self-pay | Admitting: Family Medicine

## 2024-08-27 NOTE — Telephone Encounter (Signed)
 Patient she is aware that [facility] is on vacation and will review when she comes back in this is a duplicate.

## 2024-08-27 NOTE — Telephone Encounter (Signed)
 Copied from CRM [id number]. Topic: Clinical - Prescription Issue >> Aug 27, 2024 10:14 AM Cherylann RAMAN wrote: Reason for CRM: PT states he is not taking the tirzepatide  (MOUNJARO ) 7.5 MG/0.5ML anymore due to Symptoms: nausea/vomitting /diarrhea. Pt would like an alternative medication. In addition, pt would like to speak with Provider [facility] or her nurse regarding issue with medication. Pt can be contacted at [phone] as he will be available by phone all day.

## 2024-09-07 ENCOUNTER — Ambulatory Visit: Admitting: Family
